# Patient Record
Sex: Female | Born: 1950 | Race: White | Hispanic: No | Smoking: Never smoker
Health system: Southern US, Community
[De-identification: ages and names within clinical notes are randomized; demographics above are authoritative.]

## PROBLEM LIST (undated history)

## (undated) DIAGNOSIS — K580 Irritable bowel syndrome with diarrhea: Secondary | ICD-10-CM

## (undated) DIAGNOSIS — E785 Hyperlipidemia, unspecified: Secondary | ICD-10-CM

## (undated) DIAGNOSIS — E039 Hypothyroidism, unspecified: Secondary | ICD-10-CM

## (undated) DIAGNOSIS — C439 Malignant melanoma of skin, unspecified: Secondary | ICD-10-CM

## (undated) DIAGNOSIS — K219 Gastro-esophageal reflux disease without esophagitis: Secondary | ICD-10-CM

## (undated) DIAGNOSIS — K5730 Diverticulosis of large intestine without perforation or abscess without bleeding: Secondary | ICD-10-CM

## (undated) DIAGNOSIS — K2270 Barrett's esophagus without dysplasia: Secondary | ICD-10-CM

## (undated) DIAGNOSIS — E079 Disorder of thyroid, unspecified: Secondary | ICD-10-CM

## (undated) DIAGNOSIS — K2990 Gastroduodenitis, unspecified, without bleeding: Secondary | ICD-10-CM

## (undated) DIAGNOSIS — M1990 Unspecified osteoarthritis, unspecified site: Secondary | ICD-10-CM

## (undated) DIAGNOSIS — K2970 Gastritis, unspecified, without bleeding: Secondary | ICD-10-CM

## (undated) DIAGNOSIS — Z9889 Other specified postprocedural states: Secondary | ICD-10-CM

## (undated) DIAGNOSIS — I10 Essential (primary) hypertension: Secondary | ICD-10-CM

## (undated) DIAGNOSIS — H269 Unspecified cataract: Secondary | ICD-10-CM

## (undated) DIAGNOSIS — C4490 Unspecified malignant neoplasm of skin, unspecified: Secondary | ICD-10-CM

## (undated) DIAGNOSIS — K589 Irritable bowel syndrome without diarrhea: Secondary | ICD-10-CM

## (undated) DIAGNOSIS — D649 Anemia, unspecified: Secondary | ICD-10-CM

## (undated) DIAGNOSIS — K449 Diaphragmatic hernia without obstruction or gangrene: Secondary | ICD-10-CM

## (undated) HISTORY — PX: APPENDECTOMY: SHX54

## (undated) HISTORY — DX: Hyperlipidemia, unspecified: E78.5

## (undated) HISTORY — DX: Anemia, unspecified: D64.9

## (undated) HISTORY — DX: Essential (primary) hypertension: I10

## (undated) HISTORY — DX: Unspecified osteoarthritis, unspecified site: M19.90

## (undated) HISTORY — PX: TRIGGER FINGER RELEASE: SHX641

## (undated) HISTORY — DX: Unspecified cataract: H26.9

## (undated) HISTORY — PX: TOTAL VAGINAL HYSTERECTOMY: SHX2548

## (undated) HISTORY — DX: Irritable bowel syndrome with diarrhea: K58.0

## (undated) HISTORY — DX: Unspecified malignant neoplasm of skin, unspecified: C44.90

## (undated) HISTORY — PX: EYE SURGERY: SHX253

## (undated) HISTORY — DX: Irritable bowel syndrome, unspecified: K58.9

## (undated) HISTORY — DX: Diverticulosis of large intestine without perforation or abscess without bleeding: K57.30

## (undated) HISTORY — PX: ABDOMINAL HYSTERECTOMY: SHX81

## (undated) HISTORY — DX: Barrett's esophagus without dysplasia: K22.70

## (undated) HISTORY — DX: Gastro-esophageal reflux disease without esophagitis: K21.9

## (undated) HISTORY — DX: Disorder of thyroid, unspecified: E07.9

## (undated) HISTORY — DX: Diaphragmatic hernia without obstruction or gangrene: K44.9

## (undated) HISTORY — PX: TMJ ARTHROPLASTY: SHX1066

## (undated) HISTORY — PX: OTHER SURGICAL HISTORY: SHX169

## (undated) HISTORY — DX: Malignant melanoma of skin, unspecified: C43.9

---

## 2000-06-10 DIAGNOSIS — D229 Melanocytic nevi, unspecified: Secondary | ICD-10-CM

## 2000-06-10 HISTORY — DX: Melanocytic nevi, unspecified: D22.9

## 2001-06-30 DIAGNOSIS — G2581 Restless legs syndrome: Secondary | ICD-10-CM | POA: Insufficient documentation

## 2004-01-09 DIAGNOSIS — K589 Irritable bowel syndrome without diarrhea: Secondary | ICD-10-CM | POA: Diagnosis present

## 2006-05-25 DIAGNOSIS — K7689 Other specified diseases of liver: Secondary | ICD-10-CM | POA: Insufficient documentation

## 2007-08-10 ENCOUNTER — Encounter: Admission: RE | Admit: 2007-08-10 | Discharge: 2007-08-10 | Payer: Self-pay | Admitting: Internal Medicine

## 2007-08-17 ENCOUNTER — Ambulatory Visit: Payer: Self-pay | Admitting: Internal Medicine

## 2007-10-22 HISTORY — PX: COLONOSCOPY: SHX174

## 2008-02-17 DIAGNOSIS — C4492 Squamous cell carcinoma of skin, unspecified: Secondary | ICD-10-CM

## 2008-02-17 HISTORY — DX: Squamous cell carcinoma of skin, unspecified: C44.92

## 2008-08-10 ENCOUNTER — Encounter: Admission: RE | Admit: 2008-08-10 | Discharge: 2008-08-10 | Payer: Self-pay | Admitting: Internal Medicine

## 2008-08-19 ENCOUNTER — Ambulatory Visit: Payer: Self-pay | Admitting: Gastroenterology

## 2008-09-05 ENCOUNTER — Ambulatory Visit: Payer: Self-pay | Admitting: Gastroenterology

## 2008-09-06 ENCOUNTER — Telehealth: Payer: Self-pay | Admitting: Gastroenterology

## 2008-10-03 DIAGNOSIS — K5730 Diverticulosis of large intestine without perforation or abscess without bleeding: Secondary | ICD-10-CM | POA: Insufficient documentation

## 2008-10-04 ENCOUNTER — Ambulatory Visit: Payer: Self-pay | Admitting: Gastroenterology

## 2008-10-04 DIAGNOSIS — E739 Lactose intolerance, unspecified: Secondary | ICD-10-CM | POA: Insufficient documentation

## 2008-10-04 DIAGNOSIS — R143 Flatulence: Secondary | ICD-10-CM

## 2008-10-04 DIAGNOSIS — K449 Diaphragmatic hernia without obstruction or gangrene: Secondary | ICD-10-CM | POA: Insufficient documentation

## 2008-10-04 DIAGNOSIS — I10 Essential (primary) hypertension: Secondary | ICD-10-CM | POA: Insufficient documentation

## 2008-10-04 DIAGNOSIS — M810 Age-related osteoporosis without current pathological fracture: Secondary | ICD-10-CM | POA: Insufficient documentation

## 2008-10-04 DIAGNOSIS — R142 Eructation: Secondary | ICD-10-CM

## 2008-10-04 DIAGNOSIS — R141 Gas pain: Secondary | ICD-10-CM | POA: Insufficient documentation

## 2008-10-04 DIAGNOSIS — K219 Gastro-esophageal reflux disease without esophagitis: Secondary | ICD-10-CM | POA: Insufficient documentation

## 2008-10-04 LAB — CONVERTED CEMR LAB
ALT: 28 units/L (ref 0–35)
AST: 29 units/L (ref 0–37)
Albumin: 4.3 g/dL (ref 3.5–5.2)
Alkaline Phosphatase: 71 units/L (ref 39–117)
BUN: 17 mg/dL (ref 6–23)
Basophils Absolute: 0 10*3/uL (ref 0.0–0.1)
Basophils Relative: 0.6 % (ref 0.0–3.0)
Bilirubin, Direct: 0.1 mg/dL (ref 0.0–0.3)
CO2: 29 meq/L (ref 19–32)
Calcium: 9.3 mg/dL (ref 8.4–10.5)
Chloride: 103 meq/L (ref 96–112)
Creatinine, Ser: 0.8 mg/dL (ref 0.4–1.2)
Eosinophils Absolute: 0.2 10*3/uL (ref 0.0–0.7)
Eosinophils Relative: 3.6 % (ref 0.0–5.0)
Ferritin: 79.5 ng/mL (ref 10.0–291.0)
Folate: >20 ng/mL
GFR calc Af Amer: 96 mL/min
GFR calc non Af Amer: 79 mL/min
Glucose, Bld: 98 mg/dL (ref 70–99)
HCT: 39.4 % (ref 36.0–46.0)
Hemoglobin: 13.9 g/dL (ref 12.0–15.0)
Iron: 79 ug/dL (ref 42–145)
Lymphocytes Relative: 37.7 % (ref 12.0–46.0)
MCHC: 35.2 g/dL (ref 30.0–36.0)
MCV: 94.4 fL (ref 78.0–100.0)
Monocytes Absolute: 0.6 10*3/uL (ref 0.1–1.0)
Monocytes Relative: 10.5 % (ref 3.0–12.0)
Neutro Abs: 2.5 10*3/uL (ref 1.4–7.7)
Neutrophils Relative %: 47.6 % (ref 43.0–77.0)
Platelets: 314 10*3/uL (ref 150–400)
Potassium: 3.3 meq/L — ABNORMAL LOW (ref 3.5–5.1)
RBC: 4.18 M/uL (ref 3.87–5.11)
RDW: 11.7 % (ref 11.5–14.6)
Saturation Ratios: 23.5 % (ref 20.0–50.0)
Sodium: 142 meq/L (ref 135–145)
TSH: 1.79 microintl units/mL (ref 0.35–5.50)
Tissue Transglutaminase Ab, IgA: 0.3 units (ref <7)
Total Bilirubin: 0.9 mg/dL (ref 0.3–1.2)
Total Protein: 7.3 g/dL (ref 6.0–8.3)
Transferrin: 240 mg/dL (ref >=212.0)
Vitamin B-12: 518 pg/mL (ref 211–911)
WBC: 5.3 10*3/uL (ref 4.5–10.5)

## 2008-10-05 ENCOUNTER — Ambulatory Visit: Payer: Self-pay | Admitting: Gastroenterology

## 2008-10-05 ENCOUNTER — Encounter: Payer: Self-pay | Admitting: Gastroenterology

## 2008-10-05 DIAGNOSIS — K2970 Gastritis, unspecified, without bleeding: Secondary | ICD-10-CM | POA: Insufficient documentation

## 2008-10-05 DIAGNOSIS — K2990 Gastroduodenitis, unspecified, without bleeding: Secondary | ICD-10-CM

## 2008-10-05 HISTORY — DX: Gastritis, unspecified, without bleeding: K29.70

## 2008-10-05 HISTORY — DX: Gastroduodenitis, unspecified, without bleeding: K29.90

## 2008-10-05 LAB — CONVERTED CEMR LAB: UREASE: NEGATIVE

## 2008-10-10 ENCOUNTER — Encounter: Payer: Self-pay | Admitting: Gastroenterology

## 2008-10-11 ENCOUNTER — Telehealth: Payer: Self-pay | Admitting: Gastroenterology

## 2008-10-21 HISTORY — PX: NISSEN FUNDOPLICATION: SHX2091

## 2008-10-24 DIAGNOSIS — Z8719 Personal history of other diseases of the digestive system: Secondary | ICD-10-CM | POA: Insufficient documentation

## 2008-11-10 ENCOUNTER — Ambulatory Visit: Payer: Self-pay | Admitting: Gastroenterology

## 2008-11-10 DIAGNOSIS — K2270 Barrett's esophagus without dysplasia: Secondary | ICD-10-CM | POA: Insufficient documentation

## 2008-11-16 ENCOUNTER — Telehealth: Payer: Self-pay | Admitting: Gastroenterology

## 2008-12-01 ENCOUNTER — Encounter: Payer: Self-pay | Admitting: Gastroenterology

## 2008-12-05 ENCOUNTER — Encounter: Admission: RE | Admit: 2008-12-05 | Discharge: 2008-12-05 | Payer: Self-pay | Admitting: Surgery

## 2008-12-12 ENCOUNTER — Ambulatory Visit: Payer: Self-pay | Admitting: Gastroenterology

## 2008-12-12 ENCOUNTER — Ambulatory Visit (HOSPITAL_COMMUNITY): Admission: RE | Admit: 2008-12-12 | Discharge: 2008-12-12 | Payer: Self-pay | Admitting: Gastroenterology

## 2009-01-18 ENCOUNTER — Ambulatory Visit (HOSPITAL_COMMUNITY): Admission: RE | Admit: 2009-01-18 | Discharge: 2009-01-20 | Payer: Self-pay | Admitting: Surgery

## 2009-08-11 ENCOUNTER — Encounter: Admission: RE | Admit: 2009-08-11 | Discharge: 2009-08-11 | Payer: Self-pay | Admitting: Internal Medicine

## 2010-06-03 DIAGNOSIS — Z9079 Acquired absence of other genital organ(s): Secondary | ICD-10-CM | POA: Insufficient documentation

## 2010-06-03 DIAGNOSIS — Z90710 Acquired absence of both cervix and uterus: Secondary | ICD-10-CM | POA: Insufficient documentation

## 2010-06-05 ENCOUNTER — Encounter: Payer: Self-pay | Admitting: Endocrinology

## 2010-06-05 LAB — CONVERTED CEMR LAB
ALT: 30 units/L
AST: 30 units/L
Albumin: 4.1 g/dL
Alkaline Phosphatase: 56 units/L
BUN: 16 mg/dL
CO2: 31 meq/L
Calcium: 9.6 mg/dL
Chloride: 104 meq/L
Cholesterol: 215 mg/dL
Creatinine, Ser: 0.66 mg/dL
Free T4: 0.94 ng/dL
Glucose, Bld: 89 mg/dL
HDL: 58.3 mg/dL
LDL Cholesterol: 137.7 mg/dL
Potassium: 4.5 meq/L
Sodium: 143 meq/L
TSH: 0.09 microintl units/mL
Total Bilirubin: 0.9 mg/dL
Total Protein: 7.3 g/dL
Triglyceride fasting, serum: 95 mg/dL

## 2010-06-13 ENCOUNTER — Encounter: Payer: Self-pay | Admitting: Endocrinology

## 2010-06-14 ENCOUNTER — Encounter: Payer: Self-pay | Admitting: Endocrinology

## 2010-06-14 ENCOUNTER — Ambulatory Visit: Payer: Self-pay | Admitting: Endocrinology

## 2010-06-18 ENCOUNTER — Encounter: Admission: RE | Admit: 2010-06-18 | Discharge: 2010-06-18 | Payer: Self-pay | Admitting: Endocrinology

## 2010-07-17 ENCOUNTER — Ambulatory Visit: Payer: Self-pay | Admitting: Endocrinology

## 2010-07-17 DIAGNOSIS — E039 Hypothyroidism, unspecified: Secondary | ICD-10-CM | POA: Insufficient documentation

## 2010-07-17 LAB — CONVERTED CEMR LAB: TSH: 0.47 microintl units/mL (ref 0.35–5.50)

## 2010-08-13 ENCOUNTER — Encounter: Admission: RE | Admit: 2010-08-13 | Discharge: 2010-08-13 | Payer: Self-pay | Admitting: Internal Medicine

## 2010-11-06 ENCOUNTER — Encounter: Payer: Self-pay | Admitting: Gastroenterology

## 2010-11-20 NOTE — Progress Notes (Signed)
Summary: Protonix increased to BID  Phone Note Call from Patient Call back at [phone]   Call For: Dr Jarold Motto Summary of Call: Protonix generic 40mg -was doubled at her last visit and ran out of her current supply. Would like a refill called in to [facility] on [address] with the new dose of twice a day please. Initial call taken by: Leanor [facility],  October 11, 2008 9:29 AM  Follow-up for Phone Call        pt aware... rx sent  Follow-up by: Harlow Mares CMA,  October 11, 2008 9:40 AM      Prescriptions: PANTOPRAZOLE SODIUM 40 MG TBEC (PANTOPRAZOLE SODIUM) take one tablet by mouth two times a day  #60 x 11   Entered by:   Harlow Mares CMA   Authorized by:   Mardella Layman MD FACG,FAGA   Signed by:   Harlow Mares CMA on 10/11/2008   Method used:   Electronically to        [facility]. [phone]* (retail)       [address]       [place], [place]  [address]       Ph: [phone]       Fax: [phone]   RxID:   [id number]

## 2010-11-20 NOTE — Progress Notes (Signed)
Summary: ? report  Phone Note Call from Patient Call back at Work Phone [phone]   Caller: Patient Call For: PATTERSON  Reason for Call: Talk to Nurse Details for Reason: report Summary of Call: had Colon yesterday and has ? re: report given Initial call taken by: Guadlupe Spanish [facility],  September 06, 2008 10:11 AM  Follow-up for Phone Call        return phone call concerning procedure yesterday. answered question. question about appointment next month. Follow-up by: Darlyn Read RN,  September 06, 2008 10:27 AM

## 2010-11-20 NOTE — Progress Notes (Signed)
Summary: ? meds  Phone Note Call from Patient Call back at Home Phone [phone]   Caller: Patient Call For: PATTERSON  Reason for Call: Talk to Nurse Details for Reason: ? meds  Summary of Call: does pt need to continue Boniva ? Initial call taken by: Guadlupe Spanish [facility],  November 16, 2008 3:31 PM  Follow-up for Phone Call        yes patient can continue Boniva. patients number not avb. Follow-up by: Harlow Mares CMA,  November 16, 2008 4:49 PM  Additional Follow-up for Phone Call Additional follow up Details #1::        pt aware Additional Follow-up by: Harlow Mares CMA,  November 17, 2008 10:33 AM

## 2010-11-20 NOTE — Procedures (Signed)
Summary: Colonoscopy   Colonoscopy  Procedure date:  09/05/2008  Findings:      Location:  [facility].    Patient Name: [name], [name] MRN:  Procedure Procedures: Colonoscopy CPT: [phone].  Personnel: Endoscopist: Vania Rea. Jarold Motto, MD.  Referred By: Kathlee Nations, MD.  Exam Location: Exam performed in Outpatient Clinic. Outpatient  Patient Consent: Procedure, Alternatives, Risks and Benefits discussed, consent obtained, from patient. Consent was obtained by the RN.  Indications  Average Risk Screening Routine.  History  Current Medications: Patient is taking an non-steroidal medication. Patient is not currently taking Coumadin.  Medical/ Surgical History: Reflux Disease, Irritable Bowel Syndrome, Osteoporesis,  Pre-Exam Physical: Performed Sep 05, 2008. Cardio-pulmonary exam, Abdominal exam, Extremity exam, Mental status exam WNL.  Comments: Pt. history reviewed/updated, physical exam performed prior to initiation of sedation? yes Exam Exam: Extent of exam reached: Cecum, extent intended: Cecum.  The cecum was identified by appendiceal orifice and IC valve. Patient position: on left side. Time to Cecum: 00:08:03. Time for Withdrawl: 00:04. Colon retroflexion performed. Images taken. ASA Classification: II. Tolerance: excellent.  Monitoring: Pulse and BP monitoring, Oximetry used. Supplemental O2 given. at 2 Liters.  Colon Prep Used Golytely for colon prep. Prep results: excellent.  Sedation Meds: Patient assessed and found to be appropriate for moderate (conscious) sedation. Sedation was managed by the Endoscopist. Fentanyl 100 mcg. given IV. Versed 10 mg. given IV.  Instrument(s): CF 140L. Serial [id number].  Findings - NORMAL EXAM: Cecum to Splenic Flexure. Not Seen: Polyps. AVM's. Colitis. Tumors. Crohn's. Diverticulosis.  - DIVERTICULOSIS: Descending Colon to Sigmoid Colon. Not bleeding. ICD9: Diverticulosis, Colon: 562.10. Comments:  Redundant and tortuous colon noted,,,,.  - NORMAL EXAM: Sigmoid Colon to Rectum. Tumors. Crohn's. Hemorrhoids.   Assessment  Diagnoses: 562.10: Diverticulosis, Colon. IBS.   Events  Unplanned Interventions: No intervention was required.  Plans Medication Plan: Continue current medications. Fiber supplements: Methylcellulose 1 Tbsp QAM, starting Sep 05, 2008 for indefinitely.   Patient Education: Patient given standard instructions for: Diverticulosis. high fiber diet.  Disposition: After procedure patient sent to recovery. After recovery patient sent home.  Scheduling/Referral: Follow-Up prn.    cc: Kathlee Nations, MD  This report was created from the original endoscopy report, which was reviewed and signed by the above listed endoscopist.

## 2010-11-20 NOTE — Assessment & Plan Note (Signed)
Summary: NEW ENDO ABNORM THY LEVEL [employer] PT--#-PKG/OFF--STC   Vital Signs:  Patient profile:   60 year old female Menstrual status:  hysterectomy Height:      64 inches (162.56 cm) Weight:      153.50 pounds (69.77 kg) BMI:     26.44 O2 Sat:      99 % on Room air Temp:     98.0 degrees F (36.67 degrees C) oral Pulse rate:   68 / minute BP sitting:   130 / 82  (left arm) Cuff size:   regular  Vitals Entered By: Brenton Grills MA (June 14, 2010 9:26 AM)  O2 Flow:  Room air CC: New Endo/Abnormal Thyroid/aj     Menstrual Status hysterectomy   Primary Provider:  Kathlee Nations, MD  CC:  New Endo/Abnormal Thyroid/aj.  History of Present Illness: pt was dx'ed with hypothyroidism in 1993.  she has been on thyroid hormone ever since then.  she initially took ony synthroid, until 2004, when she was found to have a low t3 level.  then, cytomel was added.   symptomatically, she says her fatigue was initially was improved on the cytomel.  however, she now reports 6 mos of moderate tremor of the hands, and associated fatigue.  Current Medications (verified): 1)  Boniva 150 Mg Tabs (Ibandronate Sodium) .... Take  Tablet By Mouth Once Per Month. 2)  Pantoprazole Sodium 40 Mg Tbec (Pantoprazole Sodium) .... Take One Tablet By Mouth Once Daily 3)  Hydrochlorothiazide 25 Mg Tabs (Hydrochlorothiazide) .... Take One By Mouth Once Daily 4)  Levothroid 88 Mcg Tabs (Levothyroxine Sodium) .... Take One By Mouth Once Daily 5)  Cytomel 5 Mcg Tabs (Liothyronine Sodium) .... Take One By Mouth Once Daily 6)  Valtrex 1 Gm Tabs (Valacyclovir Hcl) .... Take One - Two By Mouth Once Daily 7)  Calcium 600/vitamin D 600-400 Mg-Unit Tabs (Calcium Carbonate-Vitamin D) .... Take One By Mouth Once Daily 8)  One-A-Day Extras Antioxidant  Caps (Multiple Vitamins-Minerals) .... Take One By Mouth Once Daily 9)  Aspirin 81 Mg Tbec (Aspirin) .... Take 1 Tablet By Mouth Once A Day 10)  Famotidine 20 Mg Tabs (Famotidine)  .... Take 1 Tab By Mouth At Bedtime 11)  Evista 60 Mg Tabs (Raloxifene Hcl) .Marland Kitchen.. 1 Tablet Once Daily 12)  Tramadol Hcl 50 Mg Tabs (Tramadol Hcl) .Marland Kitchen.. 1-2 Tablets Every 6 Hours As Needed For Pain  Allergies: 1)  ! Fosamax (Alendronate Sodium) 2)  Codeine 3)  Tetracycline 4)  Cipro  Past History:  Past Medical History: Last updated: 10/04/2008 Current Problems:  HYPERTENSION (ICD-401.9) OSTEOPOROSIS (ICD-733.00) LACTOSE INTOLERANCE (ICD-271.3) FLATULENCE ERUCTATION AND GAS PAIN (ICD-787.3) GERD (ICD-530.81) HIATAL HERNIA (ICD-553.3) DIVERTICULOSIS, COLON (ICD-562.10)  Family History: Reviewed history from 10/04/2008 and no changes required. No FH of Colon Cancer: Family History of Esophageal Cancer:Fatherthis actually was neck and throat cancer. Family History of Diabetes: Mother Family History of Heart Disease: Mother, Father sister had partial thyroidectomy for nodules--benign  Social History: Reviewed history from 10/04/2008 and no changes required. Alcohol Use - yes-rare Patient has never smoked.  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         she also reports flushing, and excessive diaphoresis in the am.  she attributes hoarseness to rhinorrhea.  she attributes diarrhea to chronic  gi probs, and easy bruising. denies double vision, palpitations, sob, polyuria, myalgias, numbness, anxiety, and menopaisal sxs.   Physical Exam  General:  normal appearance.   Head:  head: no deformity eyes: no periorbital swelling, no proptosis external nose and ears are normal mouth: no lesion seen Neck:  small goiter with irregular surface--? bilat small nodules Lungs:  Clear to auscultation bilaterally. Normal respiratory effort.  Heart:  Regular rate and rhythm without murmurs or gallops noted.  Normal S1,S2.   Msk:  muscle bulk and strength are grossly normal.  no obvious joint swelling.  gait is normal and steady  Extremities:  no edema no deformity Neurologic:  cn 2-12 grossly intact.   readily moves all 4's.   sensation is intact to touch on the feet there is a slight fine tremor  Skin:  normal texture and temp.  no rash.  not diaphoretic  Cervical Nodes:  No significant adenopathy.  Psych:  Alert and cooperative; normal mood and affect; normal attention span and concentration.   Additional Exam:  outside test results are reviewed:  tsh=0.09   Impression & Recommendations:  Problem # 1:  GOITER, MULTINODULAR (ICD-241.1) she has probably developed hyperfunction in these nodules  Problem # 2:  hypothyroidism overreplacement now is prob due to #1  Problem # 3:  OSTEOPOROSIS (ICD-733.00) prob not thyroid-related   Problem # 4:  fatigue not thyroid-related  Medications Added to Medication List This Visit: 1)  Evista 60 Mg Tabs (Raloxifene hcl) .Marland Kitchen.. 1 tablet once daily 2)  Tramadol Hcl 50 Mg Tabs (Tramadol hcl) .Marland Kitchen.. 1-2 tablets every 6 hours as needed for pain  Other Orders: Radiology Referral (Radiology) Consultation Level IV (16109)  Patient Instructions: 1)  check thyroid ultrasound.  you will be called with a day and time for an appointment.   2)  we will need to take this situation in stages. 3)  stop cytomel. 4)  Please schedule a follow-up appointment in 1 month.   Preventive Care Screening  Mammogram:    Date:  10/21/2008    Results:  normal   Last Tetanus Booster:    Date:  07/06/2003    Results:  Historical

## 2010-11-20 NOTE — Assessment & Plan Note (Signed)
Summary: post egd of 10/05/08/scm   History of Present Illness Visit Type: follow up Primary GI MD: Sheryn Bison MD FACP FAGA Primary Provider: Kathlee Nations, MD Requesting Provider: n/a Chief Complaint: Follow-up visit from having EGD on 10/05/08. No GI complaints History of Present Illness:   [name] continues with acid reflux symptoms that are rather typical in nature despite taking Protonix 40 mg before supper and ranitidine before bed time. Her endoscopy showed a large hiatal hernia and she has biopsy confirmed Barrett's mucosa. Endoscopy did show some gastritis but H. pylori exam is negative. Her previous colonoscopy was negative. She does not have Raynaud's phenomenon or collagen vascular disease. She otherwise is healthy has no serious chronic medical problems and watches her diet closely.   GI Review of Systems    Reports acid reflux, belching, and  heartburn.      Denies abdominal pain, bloating, chest pain, dysphagia with liquids, dysphagia with solids, loss of appetite, nausea, vomiting, vomiting blood, weight loss, and  weight gain.        Denies anal fissure, black tarry stools, change in bowel habit, constipation, diarrhea, diverticulosis, fecal incontinence, heme positive stool, hemorrhoids, irritable bowel syndrome, jaundice, light color stool, liver problems, rectal bleeding, and  rectal pain.     Prior Medications Reviewed Using: Patient Recall  Prior Medication List:  BONIVA 150 MG TABS (IBANDRONATE SODIUM) Take  tablet by mouth once per month. PANTOPRAZOLE SODIUM 40 MG TBEC (PANTOPRAZOLE SODIUM) take one tablet by mouth two times a day HYDROCHLOROTHIAZIDE 25 MG TABS (HYDROCHLOROTHIAZIDE) take one by mouth once daily LEVOTHROID 88 MCG TABS (LEVOTHYROXINE SODIUM) take one by mouth once daily CYTOMEL 5 MCG TABS (LIOTHYRONINE SODIUM) take one by mouth once daily VALTREX 1 GM TABS (VALACYCLOVIR HCL) take one - two by mouth once daily CALCIUM 600/VITAMIN D 600-400 MG-UNIT  TABS (CALCIUM CARBONATE-VITAMIN D) take one by mouth once daily ONE-A-DAY EXTRAS ANTIOXIDANT  CAPS (MULTIPLE VITAMINS-MINERALS) take one by mouth once daily ASPIRIN 81 MG TBEC (ASPIRIN) Take 1 tablet by mouth once a day FAMOTIDINE 20 MG TABS (FAMOTIDINE) Take 1 tab by mouth at bedtime   Current Allergies (reviewed today): CODEINE TETRACYCLINE CIPRO  Past Medical History:    Reviewed history from 10/04/2008 and no changes required:       Current Problems:        HYPERTENSION (ICD-401.9)       OSTEOPOROSIS (ICD-733.00)       LACTOSE INTOLERANCE (ICD-271.3)       FLATULENCE ERUCTATION AND GAS PAIN (ICD-787.3)       GERD (ICD-530.81)       HIATAL HERNIA (ICD-553.3)       DIVERTICULOSIS, COLON (ICD-562.10)                Past Surgical History:    Reviewed history from 10/04/2008 and no changes required:       Hysterectomy       Tubal Ligation       triger finger surgery       Appendectomy   Family History:    Reviewed history from 10/04/2008 and no changes required:       No FH of Colon Cancer:       Family History of Esophageal Cancer:Fatherthis actually was neck and throat cancer.       Family History of Diabetes: Mother       Family History of Heart Disease: Mother, Father  Social History:    Reviewed history from 10/04/2008 and no changes required:  Alcohol Use - yes-rare       Patient has never smoked.        Daily Caffeine Use       Illicit Drug Use - no       Patient does not get regular exercise.     Review of Systems  The patient denies allergy/sinus, anemia, anxiety-new, arthritis/joint pain, back pain, blood in urine, breast changes/lumps, change in vision, confusion, cough, coughing up blood, depression-new, fainting, fatigue, fever, headaches-new, hearing problems, heart murmur, heart rhythm changes, itching, menstrual pain, muscle pains/cramps, night sweats, nosebleeds, pregnancy symptoms, shortness of breath, skin rash, sleeping problems, sore throat,  swelling of feet/legs, swollen lymph glands, thirst - excessive , urination - excessive , urination changes/pain, urine leakage, vision changes, and voice change.     Vital Signs:  Patient Profile:   60 Years Old Female Height:     64 inches Weight:      158.50 pounds BMI:     27.30 Pulse rate:   74 / minute Pulse rhythm:   regular BP sitting:   100 / 70  (left arm)  Vitals Entered By: [name] CMA (November 10, 2008 8:30 AM)                  Physical Exam  General:     Well developed, well nourished, no acute distress.healthy appearing.   Head:     Normocephalic and atraumatic. Eyes:     PERRLA, no icterus.exam deferred to patient's ophthalmologist.   Psych:     Alert and cooperative. Normal mood and affect.    Impression & Recommendations:  Problem # 1:  GERD (ICD-530.81) Assessment: Improved She has had years of refractory GERD and has Barrett's mucosa partially responsive to aggressive acid suppression. I think she is a good candidate for fundoplication surgery and have referred her to Dr. Luretha Murphy for consultation. I also scheduled esophageal manometry at her convenience. We will increase her Protonix to 40 mg 30 minutes before breakfast and supper along with standard antireflux maneuvers.  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  Problem # 3:  LACTOSE INTOLERANCE (ICD-271.3) Assessment: Improved   Patient Instructions: 1)  Copy Sent To:Dr. Luretha Murphy and Dr. Kathlee Nations 2)  Avoid foods high in acid content (tomatoes, citrus juices, spicy foods). Avoid eating within 3 to 4 hours of lying down or before exercising. Do not over eat; try smaller more frequent meals. Elevate head of bed four inches when sleeping. 3)  Esophageal manometry brochure given. 4)  increase Protonix to 40 mg twice a day 5)  Please Continue current medications. 6)  Barrett's  Esophagus brochure given.     Appended Document: post egd of 10/05/08/scm    Clinical Lists Changes  Orders: Added new Test order of Manometry (Manometry) - Signed Added new Test order of [facility] ([facility]) - Signed

## 2010-11-20 NOTE — Procedures (Signed)
Summary: manometry   Esophageal Manometry  Procedure date:  12/12/2008  Findings:      normal:    This procedure was completed on February 22 of 2010. It was a difficult exam because of a large hiatal hernia. Lower esophageal sphincter pressure appears to be normal at 19 mm of mercury with normal relaxation of swallowing. Esophageal peristalsis is normal with mean amplitude of contractions at 77 mmHg and 100% peristaltic waves noted. The upper esophageal sphincter pressure could not be recorded because of technical difficulties.

## 2010-11-20 NOTE — Assessment & Plan Note (Signed)
Summary: 1 MTH FU  STC   Vital Signs:  Patient profile:   60 year old female Menstrual status:  hysterectomy Height:      64 inches (162.56 cm) Weight:      155.25 pounds (70.57 kg) BMI:     26.74 O2 Sat:      97 % on Room air Temp:     98.7 degrees F (37.06 degrees C) oral Pulse rate:   66 / minute BP sitting:   108 / 68  (left arm) Cuff size:   regular  Vitals Entered By: Brenton Grills MA (July 17, 2010 7:59 AM)  O2 Flow:  Room air CC: 1 month F/U/aj   Primary Provider:  Kathlee Nations, MD  CC:  1 month F/U/aj.  History of Present Illness: pt has h/o chronic thyroiditis, with resultant hypothyroidism.  off the cytomel, she feels more energetic.    Current Medications (verified): 1)  Pantoprazole Sodium 40 Mg Tbec (Pantoprazole Sodium) .... Take One Tablet By Mouth Once Daily 2)  Hydrochlorothiazide 25 Mg Tabs (Hydrochlorothiazide) .... Take One By Mouth Once Daily 3)  Levothroid 88 Mcg Tabs (Levothyroxine Sodium) .... Take One By Mouth Once Daily 4)  Valtrex 1 Gm Tabs (Valacyclovir Hcl) .... Take One - Two By Mouth Once Daily 5)  Calcium 600/vitamin D 600-400 Mg-Unit Tabs (Calcium Carbonate-Vitamin D) .... Take One By Mouth Once Daily 6)  One-A-Day Extras Antioxidant  Caps (Multiple Vitamins-Minerals) .... Take One By Mouth Once Daily 7)  Aspirin 81 Mg Tbec (Aspirin) .... Take 1 Tablet By Mouth Once A Day 8)  Famotidine 20 Mg Tabs (Famotidine) .... Take 1 Tab By Mouth At Bedtime 9)  Evista 60 Mg Tabs (Raloxifene Hcl) .Marland Kitchen.. 1 Tablet Once Daily 10)  Tramadol Hcl 50 Mg Tabs (Tramadol Hcl) .Marland Kitchen.. 1-2 Tablets Every 6 Hours As Needed For Pain  Allergies (verified): 1)  ! Fosamax (Alendronate Sodium) 2)  Codeine 3)  Tetracycline 4)  Cipro  Past History:  Past Medical History: Last updated: 10/04/2008 Current Problems:  HYPERTENSION (ICD-401.9) OSTEOPOROSIS (ICD-733.00) LACTOSE INTOLERANCE (ICD-271.3) FLATULENCE ERUCTATION AND GAS PAIN (ICD-787.3) GERD  (ICD-530.81) HIATAL HERNIA (ICD-553.3) DIVERTICULOSIS, COLON (ICD-562.10)  Review of Systems  The patient denies weight loss and weight gain.    Physical Exam  General:  normal appearance.   Neck:  small goiter with irregular surface Neurologic:  no tremor Skin:  not diaphoretic Additional Exam:  FastTSH                   0.47 uIU/mL           Impression & Recommendations:  Problem # 1:  HYPOTHYROIDISM (ICD-244.9) well-replaced  Other Orders: TLB-TSH (Thyroid Stimulating Hormone) (84443-TSH) Est. Patient Level III (57846)  Patient Instructions: 1)  blood tests are being ordered for you today.  please call [phone] to hear your test results. 2)  i left message on phone tree 3)  (update: i left message on phone-tree:  same synthroid.  ret 6 mos.

## 2010-11-20 NOTE — Assessment & Plan Note (Signed)
Summary: F/U FROM COLON 09/05/08   History of Present Illness Visit Type: follow up Primary GI MD: Sheryn Bison MD FACP FAGA Primary Daviyon Widmayer: Kathlee Nations, MD Chief Complaint: Patient here for f/u after colonoscopy.  Patient states that as far as her lower GI tract, things are going much better and she has no complaints.  Patient does however complain of severe chest pressure which she relates to a hiatal hernia.  She denies any nausea, vomiting, reflux symptoms or dysphagia.  Patient does c/o some excessive belching at times. History of Present Illness:   This patient is a 60 year old white female recently did screening colonoscopy on which was unremarkable except for diverticulosis. She actually  come back to the office because of complaints of rather severe gas, bloating, and mid abdominal pain which has been present since childhood.  The patient said abdominal distention and discomfort since age 60 has had some mild constipation but no rectal bleeding chronic anemia. She's had rather severe acid reflux for many years and takes Protonix after breakfast and Pepcid at bedtime. She continues with rather classical regurgitation burning sternal chest pain, throat pressure, and choking at night. She had an upper GI series allegedly 15 years ago which showed a" hiatal hernia". She has not had previous endoscopy.  She has known lactose intolerance and also intolerance to many foods including garlic-based products. She been using yogurt recently with some improvement in her mild constipation. Her abdominal pain seemed to be associated with bloating is in the mid abdomen and associated with belching and flatus. She relates she's had numerous ultrasound exams over the last 10 years which have been negative. She apparently previously had some weakly positive celiac antibodies although this is unclear. She has had no previous abdominal surgery save appendectomy. Her father died of what sounds like pharyngeal  carcinoma. Her family is of [place] descent but there is no known celiac disease. Patient does suffer from osteoporosis. She also has hypertension is on antihypertensive medications and aspirin therapy.   GI Review of Systems    Reports acid reflux, belching, bloating, chest pain, and  heartburn.      Denies nausea, vomiting, vomiting blood, and  weight loss.        Denies anal fissure, black tarry stools, change in bowel habit, constipation, diarrhea, diverticulosis, fecal incontinence, heme positive stool, hemorrhoids, irritable bowel syndrome, jaundice, light color stool, liver problems, rectal bleeding, and  rectal pain.     Prior Medications Reviewed Using: Patient Recall  Updated Prior Medication List: BONIVA 150 MG TABS (IBANDRONATE SODIUM) Take  tablet by mouth once per month. PANTOPRAZOLE SODIUM 40 MG TBEC (PANTOPRAZOLE SODIUM) take one by mouth once daily HYDROCHLOROTHIAZIDE 25 MG TABS (HYDROCHLOROTHIAZIDE) take one by mouth once daily LEVOTHROID 88 MCG TABS (LEVOTHYROXINE SODIUM) take one by mouth once daily CYTOMEL 5 MCG TABS (LIOTHYRONINE SODIUM) take one by mouth once daily VALTREX 1 GM TABS (VALACYCLOVIR HCL) take one - two by mouth once daily CALCIUM 600/VITAMIN D 600-400 MG-UNIT TABS (CALCIUM CARBONATE-VITAMIN D) take one by mouth once daily ONE-A-DAY EXTRAS ANTIOXIDANT  CAPS (MULTIPLE VITAMINS-MINERALS) take one by mouth once daily ASPIRIN 81 MG TBEC (ASPIRIN) Take 1 tablet by mouth once a day FAMOTIDINE 20 MG TABS (FAMOTIDINE) Take 1 tab by mouth at bedtime  Current Allergies (reviewed today): CODEINE TETRACYCLINE CIPRO  Past Medical History:    Reviewed history from 10/03/2008 and no changes required:       Current Problems:        HYPERTENSION (  ICD-401.9)       OSTEOPOROSIS (ICD-733.00)       LACTOSE INTOLERANCE (ICD-271.3)       FLATULENCE ERUCTATION AND GAS PAIN (ICD-787.3)       GERD (ICD-530.81)       HIATAL HERNIA (ICD-553.3)       DIVERTICULOSIS,  COLON (ICD-562.10)                Past Surgical History:    Reviewed history from 10/03/2008 and no changes required:       Hysterectomy       Tubal Ligation       triger finger surgery       Appendectomy    Family History:    Reviewed history and no changes required:       No FH of Colon Cancer:       Family History of Esophageal Cancer:Fatherthis actually was neck and throat cancer.       Family History of Diabetes: Mother       Family History of Heart Disease: Mother, Father  Social History:    Reviewed history from 10/03/2008 and no changes required:       Alcohol Use - yes-rare       Patient has never smoked.        Daily Caffeine Use       Illicit Drug Use - no       Patient does not get regular exercise.    Risk Factors:  Tobacco use:  never Drug use:  no Caffeine use:  3 drinks per day    Type:  wine    Comments:  rare Exercise:  no   Review of Systems  The patient denies allergy/sinus, anemia, anxiety-new, arthritis/joint pain, back pain, blood in urine, breast changes/lumps, change in vision, confusion, cough, coughing up blood, depression-new, fainting, fatigue, fever, headaches-new, hearing problems, heart murmur, heart rhythm changes, itching, menstrual pain, muscle pains/cramps, night sweats, nosebleeds, pregnancy symptoms, shortness of breath, skin rash, sleeping problems, sore throat, swelling of feet/legs, swollen lymph glands, thirst - excessive , urination - excessive , urination changes/pain, urine leakage, vision changes, and voice change.     Vital Signs:  Patient Profile:   60 Years Old Female Height:     64 inches Weight:      158.50 pounds BMI:     27.30 BSA:     1.77 Pulse rate:   76 / minute Pulse rhythm:   regular BP sitting:   116 / 76  (right arm)  Vitals Entered By: Hortense Ramal CMA (October 04, 2008 8:47 AM)                  Physical Exam  General:     Well developed, well nourished, no acute distress.healthy  appearing.   Head:     Normocephalic and atraumatic. Eyes:     PERRLA, no icterus.exam deferred to patient's ophthalmologist.   Neck:     Supple; no masses or thyromegaly. Lungs:     Clear throughout to auscultation. Heart:     Regular rate and rhythm; no murmurs, rubs,  or bruits. Abdomen:     Soft, nontender and nondistended. No masses, hepatosplenomegaly or hernias noted. Normal bowel sounds.I cannot appreciate any significant distention, organomegaly masses or tenderness today. Bowel sounds are normal. Extremities:     No clubbing, cyanosis, edema or deformities noted. Neurologic:     Alert and  oriented x4;  grossly normal neurologically. Psych:  Alert and cooperative. Normal mood and affect.    Impression & Recommendations:  Problem # 1:  GERD (ICD-530.81) Assessment: Deteriorated I have reviewed reflux usually with this patient and she saw her patient education moving on acid reflux and its management. I've instructed her to take her proton 30 minutes Before breakfast and supper. She had been taking it incorrectly after meals. Endoscopy is scheduled her convenience and will obtain small bowel biopsy to exclude celiac disease. I did order screening laboratory parameters today.  Orders: T-Sprue Panel (Celiac Disease Gustavus Bryant) [id number]) EGD (EGD) TLB-CBC Platelet - w/Differential (85025-CBCD) TLB-BMP (Basic Metabolic Panel-BMET) (80048-METABOL) TLB-Hepatic/Liver Function Pnl (80076-HEPATIC) TLB-TSH (Thyroid Stimulating Hormone) (84443-TSH) TLB-B12, Serum-Total ONLY (83151-V61) TLB-Ferritin (82728-FER) TLB-Folic Acid (Folate) (82746-FOL) TLB-IBC Pnl (Iron/FE;Transferrin) (83550-IBC)   Problem # 2:  FLATULENCE ERUCTATION AND GAS PAIN (ICD-787.3) Assessment: Deteriorated considerations here would be malabsorption of non-digested carbohydrates with associated lactose intolerance. Also possible chronic bacterial overgrowth syndrome with a background of IBS. As per  above, celiac disease also needs to be excluded. I have given her a" low gas diet" until workup can be completed. Orders: EGD (EGD)    Patient Instructions: 1)  Copy Sent To:Dr. Kathlee Nations. 2)  Conscious Sedation brochure given. 3)  Upper Endoscopy brochure given. 4)  increase protonic to 40 mg twice a day 30 minutes before meals. 5)  Avoid foods high in acid content (tomatoes, citrus juices, spicy foods). Avoid eating within 3 to 4 hours of lying down or before exercising. Do not over eat; try smaller more frequent meals. Elevate head of bed four inches when sleeping. 6)  Excessive Gas Diet handout given. 7)  [facility] Patient Information Guide given to patient. 8)  Willey Blade advised to increase Protonix to two times a day dosing but she will need new rx to increase if two times a day dosing is needed after the procedure. 9)  Patient watched the Reflux movie in the office today.    ]

## 2010-11-20 NOTE — Miscellaneous (Signed)
Summary: GI PV  Clinical Lists Changes  Medications: Added new medication of MOVIPREP 100 GM  SOLR (PEG-KCL-NACL-NASULF-NA ASC-C) As per prep instructions. - Signed Rx of MOVIPREP 100 GM  SOLR (PEG-KCL-NACL-NASULF-NA ASC-C) As per prep instructions.;  #1 x 0;  Signed;  Entered by: Barton Fanny RN;  Authorized by: Mardella Layman MD [facility];  Method used: Electronically to [facility]. [phone]*, [address] [place], [place], [place]  [address], Ph: [phone], Fax: [phone] Allergies: Added new allergy or adverse reaction of CODEINE Added new allergy or adverse reaction of TETRACYCLINE Added new allergy or adverse reaction of CIPRO Observations: Added new observation of ALLERGY REV: Done (08/19/2008 15:41) Added new observation of NKA: F (08/19/2008 15:41)    Prescriptions: MOVIPREP 100 GM  SOLR (PEG-KCL-NACL-NASULF-NA ASC-C) As per prep instructions.  #1 x 0   Entered by:   Barton Fanny RN   Authorized by:   Mardella Layman MD [facility]   Signed by:   Barton Fanny RN on 08/19/2008   Method used:   Electronically to        [facility] [phone]* (retail)       [address]       [place], [place]  [address]       Ph: [phone]       Fax: [phone]   RxID:   [id number]

## 2010-11-20 NOTE — Miscellaneous (Signed)
Summary: clotest/scm  Clinical Lists Changes  Problems: Added new problem of GASTRITIS (ICD-535.50) Orders: Added new Test order of TLB-H Pylori Screen Gastric Biopsy (83013-CLOTEST) - Signed

## 2010-11-20 NOTE — Letter (Signed)
Summary: Patient Notice-Barrett's [facility]  [address] [place], [place] [address]   Phone: [phone]  Fax: [phone]        October 10, 2008 MRN: [id number]    [facility] [address] [name], [place]  [address]    Dear Ms. Mulhearn,  I am pleased to inform you that the biopsies taken during your recent endoscopic examination did not show any evidence of cancer upon pathologic examination.  However, your biopsies indicate you have a condition known as Barrett's esophagus. While not cancer, it is pre-cancerous (can progress to cancer) and needs to be monitored with repeat endoscopic examination and biopsies.  Fortunately, it is quite rare that this develops into cancer, but careful monitoring of the condition along with taking your medication as prescribed is important in reducing the risk of developing cancer.  It is my recommendation that you have a repeat upper gastrointestinal endoscopic examination in _ years.  Additional information/recommendations:  __Please call [phone] to schedule a return visit to further      evaluate your condition.  _xx_Continue with treatment plan as outlined the day of your exam.  Please call us if you have or develop heartburn, reflux symptoms, any swallowing problems, or if you have questions about your condition that have not been fully answered at this time.  Sincerely,  Mardella Layman MD [facility]  This letter has been electronically signed by your physician.

## 2010-11-20 NOTE — Procedures (Signed)
Summary: EGD   EGD  Procedure date:  10/05/2008  Findings:      Location: [facility]    ENDOSCOPY PROCEDURE REPORT  PATIENT:  [name], [name]  MR#:  [id number] BIRTHDATE:   1951-04-19   GENDER:   female  ENDOSCOPIST:   Vania Rea. Jarold Motto, MD, [facility] Referred by:   PROCEDURE DATE:  10/05/2008 PROCEDURE:  EGD with biopsy for H. pylori ASA CLASS:   Class II INDICATIONS: GERD, chest pain   MEDICATIONS:    Fentanyl 50 mcg IV, Versed 7 mg IV TOPICAL ANESTHETIC:   Exactacain Spray  DESCRIPTION OF PROCEDURE:   After the risks benefits and alternatives of the procedure were thoroughly explained, informed consent was obtained.  The LB GIF-H180 [id number] endoscope was introduced through the mouth and advanced to the second portion of the duodenum, without limitations.  The instrument was slowly withdrawn as the mucosa was fully examined. <<PROCEDUREIMAGES>>        <<OLD IMAGES>>  A hiatal hernia was found at the gastroesophageal junction. large HH noted.  Barrett's esophagus was found at the gastroesophageal junction. biopsies done.  Moderate gastritis was found in the body and the antrum of the stomach. CLO Bx. done.  The upper, middle, and distal third of the esophagus were carefully inspected and no abnormalities were noted. The z-line was well seen at the GEJ. The endoscope was pushed into the fundus which was normal including a retroflexed view. The antrum,gastric body, first and second part of the duodenum were unremarkable. in the descending duodenum. SI Bx done.R/O CELIAC DISEASE.    Retroflexed views revealed a hiatal hernia.    The scope was then withdrawn from the patient and the procedure completed.  COMPLICATIONS:   None  ENDOSCOPIC IMPRESSION:  1) Hiatal hernia at the gastroesophageal junction  2) Barrett's esophagus at the gastroesophageal junction  3) Moderate gastritis in the body and the antrum of the stomach  4) Normal EGD in the descending duodenum  5) A  hiatal hernia RECOMMENDATIONS:  1) await biopsy results  2) continue current meds  3) follow-up: GI clinic 1 month(s)  REPEAT EXAM:   No   _______________________________ Vania Rea. Jarold Motto, MD, [facility]    CC:        REPORT OF SURGICAL PATHOLOGY   Case #: [id number] Patient Name: [name], [name] Office Chart Number:  [id number]   MRN: [id number] Pathologist: Alden Server A. Delila Spence, MD DOB/Age  26-Feb-1951 (Age: 60)    Gender: F Date Taken:  10/05/2008 Date Received: 10/06/2008   FINAL DIAGNOSIS   ***MICROSCOPIC EXAMINATION AND DIAGNOSIS***   1.  SMALL BOWEL BIOPSY:  BENIGN SMALL BOWEL MUCOSA.  NO VILLOUS ATROPHY, INFLAMMATION OR OTHER ABNORMALITIES PRESENT.   2.  STOMACH, BIOPSIES:  BENIGN GASTRIC BODY TYPE MUCOSA WITH MILD CHRONIC INFLAMMATION.  NO INTESTINAL METAPLASIA, DYSPLASIA OR MALIGNANCY IDENTIFIED.     3.  ESOPHAGUS:  INTESTINAL METAPLASIA (GOBLET CELL METAPLASIA) CONSISTENT WITH BARRETT' S ESOPHAGUS.  NO DYSPLASIA OR MALIGNANCY IDENTIFIED.   COMMENT 1.  There is small bowel mucosa with normal villous architecture and no objective increase in inflammation.  No villous atrophy, active inflammation or other significant changes identified.   2.  A Warthin-Starry stain is performed to determine the possibility of the presence of Helicobacter pylori. The Warthin-Starry stain is negative for organisms of Helicobacter pylori. The control(s) stained appropriately.   3.  An Alcian Blue stain is performed to determine the presence of intestinal metaplasia (goblet cell metaplasia). The Alcian Blue stain shows  intestinal metaplasia (goblet cell metaplasia).  The control stained appropriately.   cc Date Reported:  10/07/2008     Alden Server A. Delila Spence, MD *** Electronically Signed Out By EAA ***   Clinical information Pain, belching, rule out Barrett' s esophagus (3), rule out sprue (1), rule out h. pylori (2) (mj)   specimen(s) obtained 1: Small intestine,  biopsy 2: Stomach, biopsy, antrum 3: Esophagus, biopsy   Gross Description 1.  Received in formalin are tan, soft tissue fragments that are submitted in toto.   Number:  four. Size:  0.1 cm up to 0.3 cm   2.  Received in formalin are tan, soft tissue fragments that are submitted in toto.  Number:  two. Size:  0.5 cm   3.  Received in formalin are tan, soft tissue fragments that are submitted in toto.   Number:  four. Size:  0.1 cm up to 0.3 cm.  (SP:gt, 10/06/08)   gdt/     Signed by Mardella Layman MD FACG,FAGA on 10/10/2008 at 8:19 AM  ________________________________________________________________________ EGD followup 2 years   Signed by Mardella Layman MD FACG,FAGA on 10/10/2008 at 8:19 AM  October 10, 2008 MRN: [id number]    [facility] [address] [name], [place]  [address]    Dear Ms. Canova,  I am pleased to inform you that the biopsies taken during your recent endoscopic examination did not show any evidence of cancer upon pathologic examination.  However, your biopsies indicate you have a condition known as Barrett's esophagus. While not cancer, it is pre-cancerous (can progress to cancer) and needs to be monitored with repeat endoscopic examination and biopsies.  Fortunately, it is quite rare that this develops into cancer, but careful monitoring of the condition along with taking your medication as prescribed is important in reducing the risk of developing cancer.  It is my recommendation that you have a repeat upper gastrointestinal endoscopic examination in _ years.  Additional information/recommendations:  __Please call [phone] to schedule a return visit to further      evaluate your condition.  _xx_Continue with treatment plan as outlined the day of your exam.  Please call us if you have or develop heartburn, reflux symptoms, any swallowing problems, or if you have questions about your condition that have not been fully  answered at this time.  Sincerely,  Mardella Layman MD [facility]  This letter has been electronically signed by your physician.  This report was created from the original endoscopy report, which was reviewed and signed by the above listed endoscopist.

## 2010-11-22 NOTE — Letter (Signed)
Summary: Endoscopy Letter  [facility]  [address]   [place], [place] [address]   Phone: [phone]  Fax: [phone]      November 06, 2010 MRN: [id number]   [facility] [address] [name], [place]  [address]   Dear Ms. Inch,   According to your medical record, it is time for you to schedule an Endoscopy. Endoscopic screening is recommended for patients with certain upper digestive tract conditions because of associated increased risk for cancers of the upper digestive system.  This letter has been generated based on the recommendations made at the time of your prior procedure. If you feel that in your particular situation this may no longer apply, please contact our office.  Please call our office at [phone]) to schedule this appointment or to update your records at your earliest convenience.  Thank you for cooperating with [place] to provide you with the very best care possible.   Sincerely,   Vania Rea. Jarold Motto, M.D.  [facility] [phone]

## 2010-12-18 ENCOUNTER — Telehealth: Payer: Self-pay | Admitting: Gastroenterology

## 2010-12-18 ENCOUNTER — Encounter (INDEPENDENT_AMBULATORY_CARE_PROVIDER_SITE_OTHER): Payer: Self-pay | Admitting: *Deleted

## 2010-12-27 NOTE — Progress Notes (Signed)
Summary: Medication refill  Phone Note Call from Patient Call back at Home Phone [phone]   Caller: Patient Call For: Dr. Jarold Motto Reason for Call: Refill Medication Summary of Call: Needs a refill on her PANTOPRAZOLE....[facility] [address] [facility] surgeon. Sch'd Endo for 02-15-11 Initial call taken by: Karna Christmas,  December 18, 2010 9:15 AM  Follow-up for Phone Call        one month supply sent until endo is done.  Follow-up by: Harlow Mares CMA (AAMA),  December 18, 2010 9:22 AM    Prescriptions: PANTOPRAZOLE SODIUM 40 MG TBEC (PANTOPRAZOLE SODIUM) take one tablet by mouth once daily....NEEDS ENDOSCOPY  #30 x 0   Entered by:   Harlow Mares CMA (AAMA)   Authorized by:   Mardella Layman MD [facility]   Signed by:   Harlow Mares CMA (AAMA) on 12/18/2010   Method used:   Electronically to        [facility] [phone]* (retail)       [address]       [place], [place]  [address]       Ph: [phone]       Fax: [phone]   RxID:   [id number]

## 2010-12-27 NOTE — Letter (Signed)
Summary: Pre Visit Letter Revised  [facility]  [address] [place], [place] [address]   Phone: [phone]  Fax: [phone]        12/18/2010 MRN: [id number] [facility] [address] [name], [place]  [address]             Procedure Date:  02-15-11           Recall Endo---Dr. Jarold Motto   Welcome to the Gastroenterology Division at [facility].    You are scheduled to see a nurse for your pre-procedure visit on 02-01-11 at 8:00a.m. on the 3rd floor at [facility], [address]  We ask that you try to arrive at our office 15 minutes prior to your appointment time to allow for check-in.  Please take a minute to review the attached form.  If you answer "Yes" to one or more of the questions on the first page, we ask that you call the person listed at your earliest opportunity.  If you answer "No" to all of the questions, please complete the rest of the form and bring it to your appointment.    Your nurse visit will consist of discussing your medical and surgical history, your immediate family medical history, and your medications.   If you are unable to list all of your medications on the form, please bring the medication bottles to your appointment and we will list them.  We will need to be aware of both prescribed and over the counter drugs.  We will need to know exact dosage information as well.    Please be prepared to read and sign documents such as consent forms, a financial agreement, and acknowledgement forms.  If necessary, and with your consent, a friend or relative is welcome to sit-in on the nurse visit with you.  Please bring your insurance card so that we may make a copy of it.  If your insurance requires a referral to see a specialist, please bring your referral form from your primary care physician.  No co-pay is required for this nurse visit.     If you cannot keep your appointment, please call [phone] to cancel or reschedule  prior to your appointment date.  This allows [place] the opportunity to schedule an appointment for another patient in need of care.    Thank you for choosing [facility] for your medical needs.  We appreciate the opportunity to care for you.  Please visit [place] at our website  to learn more about our practice.  Sincerely, The Gastroenterology Division

## 2011-01-16 ENCOUNTER — Other Ambulatory Visit: Payer: Self-pay | Admitting: Gastroenterology

## 2011-01-30 LAB — CBC
HCT: 35 % — ABNORMAL LOW (ref 36.0–46.0)
HCT: 35 % — ABNORMAL LOW (ref 36.0–46.0)
Hemoglobin: 12 g/dL (ref 12.0–15.0)
Hemoglobin: 12.1 g/dL (ref 12.0–15.0)
MCHC: 34.4 g/dL (ref 30.0–36.0)
MCHC: 34.7 g/dL (ref 30.0–36.0)
MCV: 93.4 fL (ref 78.0–100.0)
MCV: 93.9 fL (ref 78.0–100.0)
Platelets: 232 10*3/uL (ref 150–400)
Platelets: 263 10*3/uL (ref 150–400)
RBC: 3.72 MIL/uL — ABNORMAL LOW (ref 3.87–5.11)
RBC: 3.75 MIL/uL — ABNORMAL LOW (ref 3.87–5.11)
RDW: 13 % (ref 11.5–15.5)
RDW: 13 % (ref 11.5–15.5)
WBC: 9.7 10*3/uL (ref 4.0–10.5)
WBC: 9.9 10*3/uL (ref 4.0–10.5)

## 2011-01-30 LAB — DIFFERENTIAL
Basophils Absolute: 0 10*3/uL (ref 0.0–0.1)
Basophils Absolute: 0.1 10*3/uL (ref 0.0–0.1)
Basophils Relative: 0 % (ref 0–1)
Basophils Relative: 1 % (ref 0–1)
Eosinophils Absolute: 0.1 10*3/uL (ref 0.0–0.7)
Eosinophils Absolute: 0.1 10*3/uL (ref 0.0–0.7)
Eosinophils Relative: 1 % (ref 0–5)
Eosinophils Relative: 1 % (ref 0–5)
Lymphocytes Relative: 12 % (ref 12–46)
Lymphocytes Relative: 20 % (ref 12–46)
Lymphs Abs: 1.2 10*3/uL (ref 0.7–4.0)
Lymphs Abs: 1.9 10*3/uL (ref 0.7–4.0)
Monocytes Absolute: 0.8 10*3/uL (ref 0.1–1.0)
Monocytes Absolute: 0.9 10*3/uL (ref 0.1–1.0)
Monocytes Relative: 8 % (ref 3–12)
Monocytes Relative: 9 % (ref 3–12)
Neutro Abs: 6.9 10*3/uL (ref 1.7–7.7)
Neutro Abs: 7.7 10*3/uL (ref 1.7–7.7)
Neutrophils Relative %: 71 % (ref 43–77)
Neutrophils Relative %: 78 % — ABNORMAL HIGH (ref 43–77)

## 2011-01-31 LAB — HEMOGLOBIN AND HEMATOCRIT, BLOOD
HCT: 39.8 % (ref 36.0–46.0)
Hemoglobin: 13.6 g/dL (ref 12.0–15.0)

## 2011-01-31 LAB — BASIC METABOLIC PANEL
BUN: 11 mg/dL (ref 6–23)
CO2: 28 mEq/L (ref 19–32)
Calcium: 9 mg/dL (ref 8.4–10.5)
Chloride: 100 mEq/L (ref 96–112)
Creatinine, Ser: 0.71 mg/dL (ref 0.4–1.2)
GFR calc Af Amer: >60 mL/min (ref >60)
GFR calc non Af Amer: >60 mL/min (ref >60)
Glucose, Bld: 97 mg/dL (ref 70–99)
Potassium: 4.2 mEq/L (ref 3.5–5.1)
Sodium: 135 mEq/L (ref 135–145)

## 2011-02-15 ENCOUNTER — Other Ambulatory Visit: Payer: Self-pay | Admitting: Gastroenterology

## 2011-02-18 ENCOUNTER — Other Ambulatory Visit: Payer: Self-pay | Admitting: Gastroenterology

## 2011-03-05 NOTE — Discharge Summary (Signed)
[name], [name]                ACCOUNT NO.:  [id number]   MEDICAL RECORD NO.:  [id number]          PATIENT TYPE:  INP   LOCATION:  1529                         FACILITY:  [facility]   PHYSICIAN:  Thornton Park. Daphine Deutscher, MD  DATE OF BIRTH:  [birth year]   DATE OF ADMISSION:  01/18/2009  DATE OF DISCHARGE:  01/20/2009                               DISCHARGE SUMMARY   ADMITTING DIAGNOSIS:  Gastroesophageal reflux disease.   DISCHARGE DIAGNOSIS:  Gastroesophageal reflux disease.   PROCEDURE:  Laparoscopic Nissen fundoplication.   COURSE IN THE HOSPITAL:  Mrs. Brach underwent a lap Nissen and on  postop day #1 underwent a swallow which showed a good wrap and no  evidence of extravasation.  Postop day #2, she was taking clear liquids  well and was ready to go home and arrangements were made for follow-up  in the office around 01/30/2009.   DISCHARGE MEDICINES:  Include Phenergan suppositories as needed for  nausea, although she was not having any of this, it is a prophylactic  and some Lortab elixir for pain if needed.   The incisions looked good and she was returned to her other preoperative  meds.   CONDITION:  Stable      Matthew B. Daphine Deutscher, MD  Electronically Signed     MBM/MEDQ  D:  01/20/2009  T:  01/20/2009  Job:  [id number]

## 2011-03-05 NOTE — Op Note (Signed)
[name], [name]                ACCOUNT NO.:  [id number]   MEDICAL RECORD NO.:  [id number]          PATIENT TYPE:  AMB   LOCATION:  DAY                          FACILITY:  [facility]   PHYSICIAN:  [name]. Daphine Deutscher, MD  DATE OF BIRTH:  01-19-1951   DATE OF PROCEDURE:  01/18/2009  DATE OF DISCHARGE:                               OPERATIVE REPORT   PREOPERATIVE INDICATIONS:  Gastroesophageal reflux disease with hiatal  hernia.   POSTOPERATIVE DIAGNOSES:  A moderate-sized hiatal hernia,  gastroesophageal reflux disease, status post laparoscopic Nissen  fundoplication and 2 posterior pledgeted suture closure of the hiatus.   SURGEON:  [name]. Daphine Deutscher, MD   ASSISTANT:  Charlotte Lint, MD   ANESTHESIA:  General endotracheal.   DESCRIPTION OF PROCEDURE:  [name] was taken to room 1 on January 18, 2009 and given general anesthesia.  The abdomen was prepped with a  Techni-Care equivalent and draped sterilely.  The left upper quadrant  was then palpated and was used for OptiView placement.  Initial pass  seemed to be outside the peritoneum.  This was redirected and went end.  I saw some evidence of pneumatosis around the left colon.  Standard  trocar placements were used.  I carefully surveyed this area and looked  for evidence of trocar injury to the bowel and saw none.  This was  repeated at the end of the case and the only change had been there had  been resorption of the gas.  No evidence of distention of the colon.  The Nathanson's retractor was placed in the upper midline and we began  dissection of the foregut.  I grasped the stomach with a Babcock and was  able to reduce the fairly sizable hiatal hernia.  Harmonic scalpel was  used to cut along the gastrohepatic omentum and go into the right crus  where I was able to take the sac out and mobilize the esophagus.  I got  beneath the esophagus and put a Penrose around this EG junction.  We  kept good traction and I completely freed  the esophagogastric junction.  There was no tendency for it to go back up in the chest.  We mobilized  that segment of the esophagus nicely.   I then repaired the hiatus initially with 3 sutures pledgeted  posteriorly.  Dr. __________ then passed the 50 bougie and had some  difficulty actually getting it to come across the closure.  I went ahead  and took out the last one I placed and this allowed the bougie to pass.  Meanwhile, we taken down some short gastric vessels and had a very  floppy mobile cardia which we could use to invaginate the distal  esophagus.  I then brought this around and invaginated the distal  esophagus and plicated it over the distal esophagus with three sutures.  The uppermost one got good bites of all three stomach, esophagus,  stomach held in place with Ti-KNOT .  The two subsequent ones again got  purchases of stomach, esophagus, stomach and were tied with  intracorporeal knots.  The  hiatal closure was closed with pledgeted  sutures using the Endo stitch and with tied knots tied with a suture  passer.   At the end of the case the repair looked good and when no bleeding was  seen.  Also we checked the left colon again.  It appeared to be  improving in terms of pneumatosis and no evidence of a bowel injury.  The patient tolerated procedure well and was taken to recovery room in  satisfactory condition.      [name] Daphine Deutscher, MD  Electronically Signed     MBM/MEDQ  D:  01/18/2009  T:  01/18/2009  Job:  [id number]   cc:   Charlotte Rea. Jarold Motto, MD, FACG, FACP, FAGA  [address]  [place]  [place] [address]   [name]  Fax: [phone]

## 2011-03-19 ENCOUNTER — Other Ambulatory Visit: Payer: Self-pay | Admitting: Gastroenterology

## 2011-03-20 ENCOUNTER — Other Ambulatory Visit: Payer: Self-pay | Admitting: Gastroenterology

## 2011-03-20 NOTE — Telephone Encounter (Signed)
Advised pt that the last time she called for a month refill she cx her endo after she got her refill I have advised her that she can get prilosec OTC and take until her EGD. Then she will be given an rx for protonix.

## 2011-03-21 ENCOUNTER — Other Ambulatory Visit: Payer: Self-pay | Admitting: Obstetrics and Gynecology

## 2011-03-29 ENCOUNTER — Ambulatory Visit (AMBULATORY_SURGERY_CENTER): Payer: BC Managed Care – PPO | Admitting: *Deleted

## 2011-03-29 VITALS — Ht 64.0 in | Wt 157.1 lb

## 2011-03-29 DIAGNOSIS — K2270 Barrett's esophagus without dysplasia: Secondary | ICD-10-CM

## 2011-04-12 ENCOUNTER — Encounter: Payer: Self-pay | Admitting: Gastroenterology

## 2011-04-12 ENCOUNTER — Ambulatory Visit (AMBULATORY_SURGERY_CENTER): Payer: BC Managed Care – PPO | Admitting: Gastroenterology

## 2011-04-12 VITALS — BP 123/62 | HR 88 | Temp 98.1°F | Resp 16 | Ht 64.0 in | Wt 156.0 lb

## 2011-04-12 DIAGNOSIS — R141 Gas pain: Secondary | ICD-10-CM | POA: Insufficient documentation

## 2011-04-12 DIAGNOSIS — K2270 Barrett's esophagus without dysplasia: Secondary | ICD-10-CM

## 2011-04-12 DIAGNOSIS — K219 Gastro-esophageal reflux disease without esophagitis: Secondary | ICD-10-CM

## 2011-04-12 DIAGNOSIS — D1330 Benign neoplasm of unspecified part of small intestine: Secondary | ICD-10-CM

## 2011-04-12 DIAGNOSIS — Z9889 Other specified postprocedural states: Secondary | ICD-10-CM

## 2011-04-12 DIAGNOSIS — K2980 Duodenitis without bleeding: Secondary | ICD-10-CM

## 2011-04-12 HISTORY — DX: Other specified postprocedural states: Z98.890

## 2011-04-12 MED ORDER — SODIUM CHLORIDE 0.9 % IV SOLN: 500.0000 mL | INTRAVENOUS | Status: DC

## 2011-04-12 NOTE — Patient Instructions (Signed)
Discharge instructions given with verbal understanding. Biopsies taken. Resume previous medications. 

## 2011-04-15 ENCOUNTER — Telehealth: Payer: Self-pay

## 2011-04-15 NOTE — Telephone Encounter (Signed)

## 2011-04-19 ENCOUNTER — Encounter: Payer: Self-pay | Admitting: Gastroenterology

## 2011-05-14 ENCOUNTER — Ambulatory Visit (INDEPENDENT_AMBULATORY_CARE_PROVIDER_SITE_OTHER): Payer: BC Managed Care – PPO | Admitting: Gastroenterology

## 2011-05-14 ENCOUNTER — Encounter: Payer: Self-pay | Admitting: Gastroenterology

## 2011-05-14 ENCOUNTER — Other Ambulatory Visit: Payer: BC Managed Care – PPO

## 2011-05-14 DIAGNOSIS — K219 Gastro-esophageal reflux disease without esophagitis: Secondary | ICD-10-CM

## 2011-05-14 DIAGNOSIS — IMO0001 Reserved for inherently not codable concepts without codable children: Secondary | ICD-10-CM | POA: Insufficient documentation

## 2011-05-14 DIAGNOSIS — Z9889 Other specified postprocedural states: Secondary | ICD-10-CM

## 2011-05-14 DIAGNOSIS — R141 Gas pain: Secondary | ICD-10-CM

## 2011-05-14 DIAGNOSIS — K2270 Barrett's esophagus without dysplasia: Secondary | ICD-10-CM | POA: Insufficient documentation

## 2011-05-14 DIAGNOSIS — R142 Eructation: Secondary | ICD-10-CM

## 2011-05-14 DIAGNOSIS — R140 Abdominal distension (gaseous): Secondary | ICD-10-CM

## 2011-05-14 MED ORDER — ALIGN PO CAPS: 1.0000 | ORAL_CAPSULE | Freq: Every day | ORAL | Status: AC

## 2011-05-14 MED ORDER — RIFAXIMIN 550 MG PO TABS: 550.0000 mg | ORAL_TABLET | Freq: Two times a day (BID) | ORAL | Status: DC

## 2011-05-14 MED ORDER — HYOSCYAMINE SULFATE 0.125 MG SL SUBL: 0.1250 mg | SUBLINGUAL_TABLET | SUBLINGUAL | Status: DC | PRN

## 2011-05-14 NOTE — Patient Instructions (Addendum)
Please go to the basement today for your labs.  Your prescription(s) have been sent to you pharmacy.  Take the Align samples while on the Xifaxan. Call back when finished with treatment to let us know how your doing.

## 2011-05-14 NOTE — Progress Notes (Signed)
This is a 60 year old Caucasian female with chronic gas and bloating since childhood. She has chronic GERD and is status post fundoplication with recent endoscopy in June showing a good wrap, a small segment of Barrett's mucosa without dysplasia, and negative small bowel biopsy and H. pylori exam. He currently is on Prilosec 20 mg in the morning and Pepcid 20 mg at bedtime with control of her reflux symptoms. She continues with gas, bloating, but no real bowel irregularity. There is no history of hepatitis, pancreatitis, chronic malabsorption, Raynaud's phenomenon or collagen vascular disease. Family history is noncontributory. She has eliminated sorbitol, lactose, and other nonabsorbable carbohydrates from her diet. 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 There is no history of current skin rash, arthritis, or systemic complaints such as fever or chills.  Current Medications, Allergies, Past Medical History, Past Surgical History, Family History and Social History were reviewed in [employer] record.  Pertinent Review of Systems Negative.. status post total abdominal hysterectomy and ovarian removal many years ago for endometriosis.   Physical Exam: Awake and alert appears stated age. I cannot appreciate stigmata of chronic liver disease. Abdominal exam shows slight distention but no organomegaly, masses, tenderness, or abnormal bowel sounds. Peripheral extremities are unremarkable mental status is normal.    Assessment and Plan: Suspected bacterial overgrowth syndrome versus gluten sensitivity. As mentioned above, small bowel biopsy was negative, but I have ordered celiac serologies for review. I have placed her on Xifaxan 550 mg twice a day for 2 weeks with Align pro-biotic therapy. She is to continue her acid reflux management as previously outlined. I have recommended endoscopy and dysplasia biopsies every 3  years. Also prescribed when necessary sublingual Levsin as needed.  Please copy her primary care physician, referring physician, and pertinent subspecialists... Dr. Luretha Murphy in surgery No diagnosis found.

## 2011-05-15 LAB — CELIAC PANEL 10
Endomysial Screen: NEGATIVE
Gliadin IgA: 6.7 U/mL (ref <20)
Gliadin IgG: 9.5 U/mL (ref <20)
IgA: 224 mg/dL (ref 69–380)
Tissue Transglut Ab: 16 U/mL (ref <20)
Tissue Transglutaminase Ab, IgA: 4.4 U/mL (ref <20)

## 2011-05-17 ENCOUNTER — Telehealth: Payer: Self-pay | Admitting: Gastroenterology

## 2011-05-17 NOTE — Telephone Encounter (Signed)
Labs normal.

## 2011-06-19 ENCOUNTER — Other Ambulatory Visit: Payer: Self-pay | Admitting: Dermatology

## 2011-06-21 ENCOUNTER — Other Ambulatory Visit: Payer: Self-pay | Admitting: Internal Medicine

## 2011-06-21 DIAGNOSIS — Z1231 Encounter for screening mammogram for malignant neoplasm of breast: Secondary | ICD-10-CM

## 2011-08-16 ENCOUNTER — Ambulatory Visit
Admission: RE | Admit: 2011-08-16 | Discharge: 2011-08-16 | Disposition: A | Discharge status: Home / Self Care | Payer: BC Managed Care – PPO | Source: Ambulatory Visit | Attending: Internal Medicine | Admitting: Internal Medicine

## 2011-08-16 DIAGNOSIS — Z1231 Encounter for screening mammogram for malignant neoplasm of breast: Secondary | ICD-10-CM

## 2012-07-16 ENCOUNTER — Other Ambulatory Visit: Payer: Self-pay | Admitting: Internal Medicine

## 2012-07-16 DIAGNOSIS — Z1231 Encounter for screening mammogram for malignant neoplasm of breast: Secondary | ICD-10-CM

## 2012-08-17 ENCOUNTER — Ambulatory Visit
Admission: RE | Admit: 2012-08-17 | Discharge: 2012-08-17 | Disposition: A | Discharge status: Home / Self Care | Payer: BC Managed Care – PPO | Source: Ambulatory Visit | Attending: Internal Medicine | Admitting: Internal Medicine

## 2012-08-17 DIAGNOSIS — Z1231 Encounter for screening mammogram for malignant neoplasm of breast: Secondary | ICD-10-CM

## 2013-03-26 DIAGNOSIS — R109 Unspecified abdominal pain: Secondary | ICD-10-CM

## 2013-03-29 ENCOUNTER — Other Ambulatory Visit (INDEPENDENT_AMBULATORY_CARE_PROVIDER_SITE_OTHER): Payer: BC Managed Care – PPO

## 2013-03-29 ENCOUNTER — Telehealth: Payer: Self-pay | Admitting: Gastroenterology

## 2013-03-29 ENCOUNTER — Ambulatory Visit (INDEPENDENT_AMBULATORY_CARE_PROVIDER_SITE_OTHER): Payer: BC Managed Care – PPO | Admitting: Nurse Practitioner

## 2013-03-29 ENCOUNTER — Encounter: Payer: Self-pay | Admitting: *Deleted

## 2013-03-29 ENCOUNTER — Encounter: Payer: Self-pay | Admitting: Nurse Practitioner

## 2013-03-29 VITALS — BP 120/76 | HR 110 | Ht 64.0 in | Wt 146.0 lb

## 2013-03-29 DIAGNOSIS — R933 Abnormal findings on diagnostic imaging of other parts of digestive tract: Secondary | ICD-10-CM | POA: Insufficient documentation

## 2013-03-29 DIAGNOSIS — R197 Diarrhea, unspecified: Secondary | ICD-10-CM

## 2013-03-29 LAB — BASIC METABOLIC PANEL
BUN: 7 mg/dL (ref 6–23)
CO2: 26 mEq/L (ref 19–32)
Calcium: 8.8 mg/dL (ref 8.4–10.5)
Chloride: 105 mEq/L (ref 96–112)
Creatinine, Ser: 0.7 mg/dL (ref 0.4–1.2)
GFR: 92.3 mL/min (ref >60.00)
Glucose, Bld: 86 mg/dL (ref 70–99)
Potassium: 3.6 mEq/L (ref 3.5–5.1)
Sodium: 140 mEq/L (ref 135–145)

## 2013-03-29 MED ORDER — METRONIDAZOLE 500 MG PO TABS: ORAL_TABLET | ORAL | Status: DC

## 2013-03-29 NOTE — Telephone Encounter (Signed)
Pt reports n/v last Wednesday with abdominal cramping, fever and then diarrhea. Friday night she went to the ER in [place] and had an U/S and CT and received fluids. She reports the CT showed thickening suggestive of Diverticulitis. Last COLON 09/05/2008 showed diverticulosis from descending to sigmoid are. She received fluids and IV antibiotics at the ER and was placed on Augmentin. Pt states n/v, Cramping has cleared, but diarrhea continues. Pt to see Willette Cluster, NP this am.

## 2013-03-29 NOTE — Progress Notes (Signed)
  History of Present Illness:  Patient is a 62 year old female known to Dr. Jarold Motto. She has chronic gas and bloating, chronic GERD / Barrett's esophagus and is status post [employer] fundoplication.   Patient was seen in the emergency department at [facility] a couple of days ago for evaluation of fever, abdominal pain, nausea, and diarrhea. Fever, pain and nausea started early last week followed by onset of diarrhea  Friday morning. She went to ED Friday evening. Ultrasound of the abdomen was negative. Labs including LFTs and amylase were normal. WBC normal. H. pylori serology negative. Hemoglobin normal at 14.7. Urinalysis unremarkable. CT scan of the abdomen and pelvis with contrast revealed diffuse diverticulosis of the mid sigmoid without definite evidence of diverticulitis but some minimal associated wall thickening. Patient was given Augmentin which she started Friday (3 days ago). Nausea overall better. Fever resolved. Abdominal pain better but having persistent diarrhea. Having multiple (up to 20 times a day) foul smelling loose stools a day with some associated incontinence. No antibiotics in last few months (except for Augmentin she is taking at present). No out of country travel. No sick contacts. Diarrhea not any better on Augmentin.    Current Medications, Allergies, Past Medical History, Past Surgical History, Family History and Social History were reviewed in [employer] record.  Physical Exam: General: Well developed , white female in no acute distress Head: Normocephalic and atraumatic Eyes:  sclerae anicteric, conjunctiva pink  Ears: Normal auditory acuity Lungs: Clear throughout to auscultation Heart: Regular rate and rhythm Abdomen: Soft, non distended, mild diffuse tenderness. No masses, no hepatomegaly. Normal bowel sounds Musculoskeletal: Symmetrical with no gross deformities  Extremities: No edema  Neurological: Alert oriented x 4, grossly  nonfocal Psychological:  Alert and cooperative. Normal mood and affect  Assessment and Recommendations:  1. Acute diarrhea. Diarrhea initially associated with nausea, fever and diffuse abdominal pain. CTscan reveals minimal wall thickening of the sigmoid, diverticulosis without definite diverticulitis. I suspect patient actually has infectious colitis. She does not look toxic. Plan:  discontinue Augmentin.  Check electrolytes  Push fluids  Check stool studies then start Flagyl  Use Imodium as directed  Will call her with lab results and for a condition update.   Work excuse for last Thursday through Wed or Thursday of this week.

## 2013-03-29 NOTE — Patient Instructions (Addendum)
Please go to the basement level to have your labs drawn and have stool studies. We will call you with the results. We sent the prescription for the Metronidazole ( Flagyl) to [facility] at [address] Stop the Augmentin.  You may take Imodium Over the Counter as directed.  We have given you a work excuse from last Thursday 6-5 thru this Wed or Thurs, if you feel well enough to go back.  If not please call us and we will extend it.

## 2013-03-30 ENCOUNTER — Other Ambulatory Visit: Payer: BC Managed Care – PPO

## 2013-03-30 DIAGNOSIS — R197 Diarrhea, unspecified: Secondary | ICD-10-CM

## 2013-03-30 DIAGNOSIS — R933 Abnormal findings on diagnostic imaging of other parts of digestive tract: Secondary | ICD-10-CM

## 2013-03-31 LAB — CLOSTRIDIUM DIFFICILE BY PCR: Toxigenic C. Difficile by PCR: NOT DETECTED

## 2013-03-31 LAB — FECAL LACTOFERRIN, QUANT: Lactoferrin: POSITIVE

## 2013-04-02 ENCOUNTER — Telehealth: Payer: Self-pay | Admitting: Nurse Practitioner

## 2013-04-02 NOTE — Telephone Encounter (Signed)
Unable to reach patient. Will try again later. 

## 2013-04-02 NOTE — Telephone Encounter (Signed)
See results note. 

## 2013-04-03 LAB — STOOL CULTURE

## 2013-04-06 ENCOUNTER — Encounter: Payer: Self-pay | Admitting: Gastroenterology

## 2013-08-02 ENCOUNTER — Telehealth: Payer: Self-pay | Admitting: Gastroenterology

## 2013-08-02 NOTE — Telephone Encounter (Signed)
Pt had COLON on 09/05/2008; she had no polyps, only diverticulosis and she reports no family hx or changes in bowel habits. Based on this, informed pt her recall should be in 2019. Reminded her of EGD due next year for Barrett's surveillance; pt stated understanding.

## 2013-09-08 ENCOUNTER — Other Ambulatory Visit: Payer: Self-pay | Admitting: Dermatology

## 2014-05-12 ENCOUNTER — Telehealth: Payer: Self-pay | Admitting: Internal Medicine

## 2014-05-12 NOTE — Telephone Encounter (Signed)
Patient is having a lot of discomfort and soreness under her rib cage. She wants to be seen sooner because she is so uncomfortable. Appointment scheduled with J Zehr for 05/19/14.

## 2014-05-13 ENCOUNTER — Encounter: Payer: Self-pay | Admitting: *Deleted

## 2014-05-19 ENCOUNTER — Ambulatory Visit (INDEPENDENT_AMBULATORY_CARE_PROVIDER_SITE_OTHER): Payer: BC Managed Care – PPO | Admitting: Gastroenterology

## 2014-05-19 ENCOUNTER — Encounter: Payer: Self-pay | Admitting: Gastroenterology

## 2014-05-19 VITALS — BP 128/78 | HR 72 | Ht 64.0 in | Wt 160.2 lb

## 2014-05-19 DIAGNOSIS — R1013 Epigastric pain: Secondary | ICD-10-CM

## 2014-05-19 DIAGNOSIS — R140 Abdominal distension (gaseous): Secondary | ICD-10-CM

## 2014-05-19 DIAGNOSIS — R143 Flatulence: Secondary | ICD-10-CM

## 2014-05-19 DIAGNOSIS — K2270 Barrett's esophagus without dysplasia: Secondary | ICD-10-CM

## 2014-05-19 DIAGNOSIS — R142 Eructation: Secondary | ICD-10-CM

## 2014-05-19 DIAGNOSIS — R141 Gas pain: Secondary | ICD-10-CM

## 2014-05-19 NOTE — Progress Notes (Addendum)
05/19/2014 [name] [id number] [birth year]   History of Present Illness:  This is a 63 year old female who is previously known to Dr. Sharlett Iles. She has a history of Barrett's esophagus/GERD and underwent Nissen fundoplication in 0093. Her last EGD was in June 2012 at which time she was found to have changes consistent with Nissen fundoplication and some duodenitis. Biopsies of the esophagus showed changes consistent with Barrett's without dysplasia. Biopsies of the duodenum were normal. It was recommended that she have a repeat EGD in 3 years from that time.  Her last colonoscopy was in November 2009 at which time she was found have only diverticulosis and it was recommended that she have repeat colonoscopy in 10 years from that time.  She presents to our office today with complaints of epigastric abdominal pain and significant bloating in her upper abdomen along with belching. She says that the symptoms have been present for the past 2 months but over the past 3 weeks they have become very horrible. She is miserable and does not feel like doing anything due to her symptoms. Her pain is in her upper abdomen she describes it as a swelling/bloating sensation. A lot of times it is very painful up under her left rib cage. She describes a burning in her throat.  She tells me that she has been on every acid medication without relief of similar symptoms several years ago prior to her fundoplication. Most recently, she had been taking omeprazole every morning but stopped about 3 weeks ago because it seemed to make her feel worse. She takes Pepcid at bedtime which helps with nighttime reflux. She takes a daily probiotic in the form of align.  She has been using Gas-X several times throughout the day which seems to help, but relief is only transient. She tried Gaviscon as well.  She is convinced that something has happened with her Nissen fundoplication. She says that she feels the same way she did prior to  her surgery.   Current Medications, Allergies, Past Medical History, Past Surgical History, Family History and Social History were reviewed in [employer] record.   Physical Exam: BP 128/78  Pulse 72  Ht 5\' 4"  (1.626 m)  Wt 160 lb 3.2 oz (72.666 kg)  BMI 27.48 kg/m2 General: Well developed white female in no acute distress Head: Normocephalic and atraumatic Eyes:  Sclerae anicteric, conjunctiva pink  Ears: Normal auditory acuity Lungs: Clear throughout to auscultation Heart: Regular rate and rhythm Abdomen: Soft, non-distended.  Normal bowel sounds.  Mild diffuse TTP> in her upper abdomen without R/R/G Musculoskeletal: Symmetrical with no gross deformities  Extremities: No edema  Neurological: Alert oriented x 4, grossly non-focal Psychological:  Alert and cooperative. Normal mood and affect  Assessment and Recommendations: -History of Barrett's esophagus:  Was due for 3 year EGD recall in June.  Will schedule EGD with Dr. Hilarie Fredrickson.  The risks, benefits, and alternatives were discussed with the patient and she consents to proceed.  -Epigastric abdominal pain/belching:  She is convinced that something has gone wrong with her previous Nissen fundoplication.  She is getting EGD as stated above, but will check UGI in the interim as well.  She says that she has tried every PPI along with other medications without relief but thinks that pepcid helps her some at bedtime.  She will increase the pepcid 20 mg to three times daily for now. -Bloating:  Likely has IBS/SIBO with several chronic/recurrent GI complaints.  Once again, she  has tried several medications without relief.  Will try the FODMAP diet.  Could consider trial of xifaxan as I am not sure that she has ever been placed on that medication.  ? If this is related to her UGI complaints.   Addendum: Reviewed and agree with initial management. Jerene Bears, MD

## 2014-05-19 NOTE — Patient Instructions (Signed)
You have been scheduled for an endoscopy. Please follow written instructions given to you at your visit today. If you use inhalers (even only as needed), please bring them with you on the day of your procedure. Your physician has requested that you go to www.startemmi.com and enter the access code given to you at your visit today. This web site gives a general overview about your procedure. However, you should still follow specific instructions given to you by our office regarding your preparation for the procedure.  You have been scheduled for an Upper Gastroenterology Series at [facility] for 05-30-2014 at 930 am.  Nothing to eat or drink three hours prior to appointment. Please go to the first floor of [facility], be there fifteen minutes early for registration.    Please increase your Pepcid to three times daily. This can be purchased over the counter.  Please follow FOD MAP Diet given today.

## 2014-05-20 DIAGNOSIS — R1013 Epigastric pain: Secondary | ICD-10-CM | POA: Insufficient documentation

## 2014-05-23 ENCOUNTER — Encounter: Payer: Self-pay | Admitting: Internal Medicine

## 2014-05-30 ENCOUNTER — Ambulatory Visit (HOSPITAL_COMMUNITY)
Admission: RE | Admit: 2014-05-30 | Discharge: 2014-05-30 | Disposition: A | Discharge status: Home / Self Care | Payer: BC Managed Care – PPO | Source: Ambulatory Visit | Attending: Gastroenterology | Admitting: Gastroenterology

## 2014-05-30 DIAGNOSIS — K449 Diaphragmatic hernia without obstruction or gangrene: Secondary | ICD-10-CM | POA: Insufficient documentation

## 2014-05-30 DIAGNOSIS — K2270 Barrett's esophagus without dysplasia: Secondary | ICD-10-CM

## 2014-06-01 ENCOUNTER — Ambulatory Visit (AMBULATORY_SURGERY_CENTER): Payer: BC Managed Care – PPO | Admitting: Internal Medicine

## 2014-06-01 ENCOUNTER — Encounter: Payer: Self-pay | Admitting: Internal Medicine

## 2014-06-01 VITALS — BP 135/84 | HR 71 | Temp 97.5°F | Resp 19 | Ht 64.0 in | Wt 160.0 lb

## 2014-06-01 DIAGNOSIS — K2270 Barrett's esophagus without dysplasia: Secondary | ICD-10-CM

## 2014-06-01 DIAGNOSIS — K2970 Gastritis, unspecified, without bleeding: Secondary | ICD-10-CM

## 2014-06-01 DIAGNOSIS — R141 Gas pain: Secondary | ICD-10-CM

## 2014-06-01 DIAGNOSIS — R143 Flatulence: Secondary | ICD-10-CM

## 2014-06-01 DIAGNOSIS — R142 Eructation: Secondary | ICD-10-CM

## 2014-06-01 DIAGNOSIS — K449 Diaphragmatic hernia without obstruction or gangrene: Secondary | ICD-10-CM

## 2014-06-01 DIAGNOSIS — Z9889 Other specified postprocedural states: Secondary | ICD-10-CM

## 2014-06-01 DIAGNOSIS — K2990 Gastroduodenitis, unspecified, without bleeding: Secondary | ICD-10-CM

## 2014-06-01 DIAGNOSIS — R1012 Left upper quadrant pain: Secondary | ICD-10-CM

## 2014-06-01 DIAGNOSIS — R140 Abdominal distension (gaseous): Secondary | ICD-10-CM

## 2014-06-01 MED ORDER — SODIUM CHLORIDE 0.9 % IV SOLN: 500.0000 mL | INTRAVENOUS | Status: DC

## 2014-06-01 NOTE — Progress Notes (Signed)
Called to room to assist during endoscopic procedure.  Patient ID and intended procedure confirmed with present staff. Received instructions for my participation in the procedure from the performing physician.  

## 2014-06-01 NOTE — Op Note (Signed)
[place]  [address] [place], [id number]   ENDOSCOPY PROCEDURE REPORT  PATIENT: [name], [name]  MR#: [id number] BIRTHDATE: 1951-05-14 , 62  yrs. old GENDER: Female ENDOSCOPIST: Jerene Bears, MD PROCEDURE DATE:  06/01/2014 PROCEDURE:  EGD w/ biopsy ASA CLASS:     Class II INDICATIONS:  history of Barrett's esophagus.   LUQ/Left lower chest pain; abdominal bloating; history of Nissen fundoplication. MEDICATIONS: MAC sedation, administered by CRNA and propofol (Diprivan) 220mg  IV TOPICAL ANESTHETIC: none  DESCRIPTION OF PROCEDURE: After the risks benefits and alternatives of the procedure were thoroughly explained, informed consent was obtained.  The LB [id number] endoscope was introduced through the mouth and advanced to the second portion of the duodenum. Without limitations.  The instrument was slowly withdrawn as the mucosa was fully examined.  ESOPHAGUS: A slightly irregular Z-line was observed 37 cm from the incisors.  Multiple biopsies given history of Barrett's esophagus. The esophagus was otherwise normal.  STOMACH: A Nissen fundoplication wrap was found and was intact. On the greater curvature side there is a small portion of gastric cardia located beside and above the wrap (paraesophageal).  The mucosa of the stomach appeared normal, though there was scant heme noted in the proximal stomach seen on entering the stomach. Biopsies were taken in the gastric body, antrum and angularis.  DUODENUM: The duodenal mucosa showed no abnormalities in the bulb and second portion of the duodenum.  Retroflexed views revealed as previously described.     The scope was then withdrawn from the patient and the procedure completed.  COMPLICATIONS: There were no complications.  ENDOSCOPIC IMPRESSION: 1.   Slightly irregular Z-line was observed 37 cm from the incisors 2.   The esophagus was otherwise normal. 3.   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   The mucosa of the stomach appeared normal; biopsies were taken in the body, antrum and angularis 5.   The duodenal mucosa showed no abnormalities in the bulb and second portion of the duodenum  RECOMMENDATIONS: 1.  Await biopsy results 2.  Trial of rifaximin 550 mg three times daily for 10 day for bloating/bacterial overgrowth 3.  CT chest to evaluate for paraesophageal hernia and her LUQ pain 4.  Office follow-up next available  eSigned:  Jerene Bears, MD 06/01/2014 4:03 PM   CC:The Patient; Eber Hong, MD  PATIENT NAME:  [name], Threat MR#: [id number]

## 2014-06-01 NOTE — Patient Instructions (Signed)
Discharge instructions given with verbal understanding. Biopsies taken.  Office will schedule CT of chest. YOU HAD AN ENDOSCOPIC PROCEDURE TODAY AT [facility]: Refer to the procedure report that was given to you for any specific questions about what was found during the examination.  If the procedure report does not answer your questions, please call your gastroenterologist to clarify.  If you requested that your care partner not be given the details of your procedure findings, then the procedure report has been included in a sealed envelope for you to review at your convenience later.  YOU SHOULD EXPECT: Some feelings of bloating in the abdomen. Passage of more gas than usual.  Walking can help get rid of the air that was put into your GI tract during the procedure and reduce the bloating. If you had a lower endoscopy (such as a colonoscopy or flexible sigmoidoscopy) you may notice spotting of blood in your stool or on the toilet paper. If you underwent a bowel prep for your procedure, then you may not have a normal bowel movement for a few days.  DIET: Your first meal following the procedure should be a light meal and then it is ok to progress to your normal diet.  A half-sandwich or bowl of soup is an example of a good first meal.  Heavy or fried foods are harder to digest and may make you feel nauseous or bloated.  Likewise meals heavy in dairy and vegetables can cause extra gas to form and this can also increase the bloating.  Drink plenty of fluids but you should avoid alcoholic beverages for 24 hours.  ACTIVITY: Your care partner should take you home directly after the procedure.  You should plan to take it easy, moving slowly for the rest of the day.  You can resume normal activity the day after the procedure however you should NOT DRIVE or use heavy machinery for 24 hours (because of the sedation medicines used during the test).    SYMPTOMS TO REPORT IMMEDIATELY: A  gastroenterologist can be reached at any hour.  During normal business hours, 8:30 AM to 5:00 PM Monday through Friday, call [phone].  After hours and on weekends, please call the GI answering service at [phone] who will take a message and have the physician on call contact you.   Following upper endoscopy (EGD)  Vomiting of blood or coffee ground material  New chest pain or pain under the shoulder blades  Painful or persistently difficult swallowing  New shortness of breath  Fever of 100F or higher  Black, tarry-looking stools  FOLLOW UP: If any biopsies were taken you will be contacted by phone or by letter within the next 1-3 weeks.  Call your gastroenterologist if you have not heard about the biopsies in 3 weeks.  Our staff will call the home number listed on your records the next business day following your procedure to check on you and address any questions or concerns that you may have at that time regarding the information given to you following your procedure. This is a courtesy call and so if there is no answer at the home number and we have not heard from you through the emergency physician on call, we will assume that you have returned to your regular daily activities without incident.  SIGNATURES/CONFIDENTIALITY: You and/or your care partner have signed paperwork which will be entered into your electronic medical record.  These signatures attest to the fact that that the information  above on your After Visit Summary has been reviewed and is understood.  Full responsibility of the confidentiality of this discharge information lies with you and/or your care-partner. 

## 2014-06-01 NOTE — Progress Notes (Signed)
Report to PACU, RN, vss, BBS= Clear.  

## 2014-06-02 ENCOUNTER — Encounter: Payer: Self-pay | Admitting: *Deleted

## 2014-06-02 ENCOUNTER — Other Ambulatory Visit: Payer: Self-pay | Admitting: *Deleted

## 2014-06-02 ENCOUNTER — Telehealth: Payer: Self-pay | Admitting: Internal Medicine

## 2014-06-02 ENCOUNTER — Telehealth: Payer: Self-pay | Admitting: *Deleted

## 2014-06-02 DIAGNOSIS — R1012 Left upper quadrant pain: Secondary | ICD-10-CM

## 2014-06-02 MED ORDER — RIFAXIMIN 550 MG PO TABS: ORAL_TABLET | ORAL | Status: DC

## 2014-06-02 NOTE — Telephone Encounter (Signed)
Advised patient that I have initiated prior authorization. She verbalizes understanding.

## 2014-06-02 NOTE — Telephone Encounter (Signed)
Per procedure report on 06/02/14 scheduled CT chest on 06/03/14 at 4:00 PM. NPO 2 hours prior. F/u OV 08/02/14 2:45 PM with Dr. Hilarie Fredrickson. Patient notified of appointments and instructions.

## 2014-06-03 ENCOUNTER — Telehealth: Payer: Self-pay | Admitting: *Deleted

## 2014-06-03 ENCOUNTER — Ambulatory Visit (INDEPENDENT_AMBULATORY_CARE_PROVIDER_SITE_OTHER)
Admission: RE | Admit: 2014-06-03 | Discharge: 2014-06-03 | Disposition: A | Discharge status: Home / Self Care | Payer: BC Managed Care – PPO | Source: Ambulatory Visit | Attending: Internal Medicine | Admitting: Internal Medicine

## 2014-06-03 DIAGNOSIS — R1012 Left upper quadrant pain: Secondary | ICD-10-CM

## 2014-06-03 MED ORDER — IOHEXOL 300 MG/ML  SOLN
80.0000 mL | Freq: Once | INTRAMUSCULAR | Status: AC | PRN
  Administered 2014-06-03: 80 mL via INTRAVENOUS

## 2014-06-03 NOTE — Telephone Encounter (Signed)
  Follow up Call-  Call back number 06/01/2014  Post procedure Call Back phone  # [phone]  Permission to leave phone message Yes  comments no voicemail     Patient questions:  Patient told to call us if necessary.

## 2014-06-03 NOTE — Telephone Encounter (Signed)
[employer] has authorized xifaxan from 06/03/14-06/17/14. Reference #[id number]. I have left a message advising patient of this.

## 2014-06-06 ENCOUNTER — Telehealth: Payer: Self-pay | Admitting: Internal Medicine

## 2014-06-06 ENCOUNTER — Encounter: Payer: Self-pay | Admitting: Internal Medicine

## 2014-06-06 NOTE — Telephone Encounter (Signed)
See result note.  

## 2014-06-07 ENCOUNTER — Other Ambulatory Visit: Payer: Self-pay

## 2014-06-07 DIAGNOSIS — K449 Diaphragmatic hernia without obstruction or gangrene: Secondary | ICD-10-CM

## 2014-06-08 ENCOUNTER — Ambulatory Visit (INDEPENDENT_AMBULATORY_CARE_PROVIDER_SITE_OTHER): Payer: BC Managed Care – PPO | Admitting: Surgery

## 2014-06-08 ENCOUNTER — Encounter (INDEPENDENT_AMBULATORY_CARE_PROVIDER_SITE_OTHER): Payer: Self-pay | Admitting: Surgery

## 2014-06-08 VITALS — BP 120/80 | HR 96 | Temp 97.5°F | Ht 64.0 in | Wt 160.0 lb

## 2014-06-08 DIAGNOSIS — K2100 Gastro-esophageal reflux disease with esophagitis, without bleeding: Secondary | ICD-10-CM

## 2014-06-08 NOTE — Progress Notes (Signed)
DATE OF PROCEDURE: 01/18/2009  DATE OF DISCHARGE:  OPERATIVE REPORT  PREOPERATIVE INDICATIONS: Gastroesophageal reflux disease with hiatal  hernia.  POSTOPERATIVE DIAGNOSES: A moderate-sized hiatal hernia,  gastroesophageal reflux disease, status post laparoscopic Nissen  fundoplication and 2 posterior pledgeted suture closure of the hiatus.  SURGEON: Isabel Caprice. Hassell Done, MD  ASSISTANT: Stark Klein, MD  Chief Complaint:  Epigastric pain and postprandial distention with heartburn  History of Present Illness:  [name] is an 63 y.o. female who had 5 very good years after her Nissen fundoplication repair of large hiatal hernia. She had a upper respiratory tract infection and a lot of coughing earlier this year in March began having bloating and discomfort and pain particularly after eating. She described as good in the morning but later in the day she grows very comfortable. She's also begun to be able to burp a little bit. I reviewed her upper endoscopy by Dr. Elmo Putt and it looks like her greater curvature his herniated above the wrap with a little bit above exposing the distal esophagus to acid. I think perhaps she would benefit from take down and re\re plication for Nissen fundoplication. I described the operation and is complexity. She was would have this done. We will schedule Elvina Sidle.  Past Medical History  Diagnosis Date  . Hypertension   . Barrett's esophagus   . IBS (irritable bowel syndrome)   . GERD (gastroesophageal reflux disease)   . Hiatal hernia   . Degeneration of intervertebral disc, site unspecified   . Thyroid disease   . Diverticulosis of colon (without mention of hemorrhage)     Past Surgical History  Procedure Laterality Date  . Total vaginal hysterectomy    . Colonoscopy  2009    562.10  . Tmj arthroplasty    . Trigger finger release    . Nissen fundoplication  6767  . Appendectomy      Current Outpatient Prescriptions  Medication Sig Dispense  Refill  . acetaminophen (TYLENOL) 325 MG tablet Take 650 mg by mouth as needed.      . Calcium Carbonate-Vitamin D (CALCIUM 600 + D PO) Take 1 tablet by mouth daily.        . famotidine (PEPCID) 20 MG tablet Take 20 mg by mouth every evening. At night      . HYDROCHLOROTHIAZIDE PO Take 25 tablets by mouth daily.        . hyoscyamine (LEVSIN SL) 0.125 MG SL tablet Place 0.125 mg under the tongue every 6 (six) hours as needed.      Marland Kitchen levothyroxine (SYNTHROID, LEVOTHROID) 88 MCG tablet Take 88 mcg by mouth daily.        . Multiple Vitamin (MULTIVITAMIN) tablet Take 1 tablet by mouth daily.        . Probiotic Product (ALIGN) 4 MG CAPS Take 1 capsule by mouth daily.      . raloxifene (EVISTA) 60 MG tablet Take 60 mg by mouth daily.        . rifaximin (XIFAXAN) 550 MG TABS tablet Take one po TID for 10 days for bloating/bacterial overgrowth  30 tablet  0  . traMADol (ULTRAM) 50 MG tablet Take 50 mg by mouth every 6 (six) hours as needed.        . valACYclovir (VALTREX) 1000 MG tablet        Current Facility-Administered Medications  Medication Dose Route Frequency Provider Last Rate Last Dose  . 0.9 %  sodium chloride infusion  500 mL Intravenous Continuous  Sable Feil, MD       Alendronate sodium; Ciprofloxacin; Codeine; and Tetracycline Family History  Problem Relation Age of Onset  . Cancer Father 35    MOUTH AND THROAT  . Esophageal cancer Father   . Colon cancer Neg Hx    Social History:   reports that she has never smoked. She has never used smokeless tobacco. She reports that she does not drink alcohol or use illicit drugs.   REVIEW OF SYSTEMS : Negative except for chest pain and abdominal distention abdominal pain constipation diarrhea  Physical Exam:   Blood pressure 120/80, pulse 96, temperature 97.5 F (36.4 C), height 5\' 4"  (1.626 m), weight 160 lb (72.576 kg). Body mass index is 27.45 kg/(m^2).  Gen:  WDWN white female NAD  Neurological: Alert and oriented to  person, place, and time. Motor and sensory function is grossly intact  Head: Normocephalic and atraumatic.  Eyes: Conjunctivae are normal. Pupils are equal, round, and reactive to light. No scleral icterus.  Neck: Normal range of motion. Neck supple. No tracheal deviation or thyromegaly present.  Cardiovascular:  SR without murmurs or gallops.  No carotid bruits Breast:  Not examined Respiratory: Effort normal.  No respiratory distress. No chest wall tenderness. Breath sounds normal.  No wheezes, rales or rhonchi.  Abdomen:  nontender at present GU:  Not examined Musculoskeletal: Normal range of motion. Extremities are nontender. No cyanosis, edema or clubbing noted Lymphadenopathy: No cervical, preauricular, postauricular or axillary adenopathy is present Skin: Skin is warm and dry. No rash noted. No diaphoresis. No erythema. No pallor. Pscyh: Normal mood and affect. Behavior is normal. Judgment and thought content normal.   LABORATORY RESULTS: No results found for this or any previous visit (from the past 48 hour(s)).   RADIOLOGY RESULTS: No results found.  Problem List: Patient Active Problem List   Diagnosis Date Noted  . Abdominal pain, epigastric 05/20/2014  . Diarrhea 03/29/2013  . Nonspecific (abnormal) findings on radiological and other examination of gastrointestinal tract 03/29/2013  . GERD (gastroesophageal reflux disease) 05/14/2011  . Bloating 05/14/2011  . Gas 05/14/2011  . Barrett esophagus 05/14/2011  . S/P laparoscopic fundoplication 16/07/9603  . Duodenitis 04/12/2011  . Abdominal gas pain 04/12/2011  . HYPOTHYROIDISM 07/17/2010  . BARRETT'S ESOPHAGUS 11/10/2008  . GASTRITIS 10/05/2008  . LACTOSE INTOLERANCE 10/04/2008  . HYPERTENSION 10/04/2008  . GERD 10/04/2008  . HIATAL HERNIA 10/04/2008  . OSTEOPOROSIS 10/04/2008  . FLATULENCE ERUCTATION AND GAS PAIN 10/04/2008  . DIVERTICULOSIS, COLON 10/03/2008    Assessment & Plan: Evidence of anterior prolapse  and above the Nissen wrap. She is symptomatic and will need redo Nissen. We'll schedule the [employer].    Matt B. Hassell Done, MD, [facility] [phone] beeper [phone]  06/08/2014 3:43 PM

## 2014-06-08 NOTE — Patient Instructions (Signed)
Nissen Fundoplication °Care After °Please read the instructions outlined below and refer to this sheet for the next few weeks. These discharge instructions provide you with general information on caring for yourself after you leave the hospital. Your doctor may also give you specific instructions. While your treatment has been planned according to the most current medical practices available, unavoidable complications sometimes happen. If you have any problems or questions after discharge, please call your doctor. °ACTIVITY °· Take frequent rest periods throughout the day. °· Take frequent walks throughout the day. This will help to prevent blood clots. °· Continue to do your coughing and deep breathing exercises once you get home. This will help to prevent pneumonia. °· No strenuous activities such as heavy lifting, pushing or pulling until after your follow-up visit with your doctor. Do not lift anything heavier than 10 pounds. °· Talk with your caregiver about when you may return to work and your exercise routine. °· You may shower 2 days after surgery. Pat incisions dry. Do not rub incisions with washcloth or towel. °· Do not drive while taking prescription pain medication. °NUTRITION °· Continue with a liquid diet, or the diet you were directed to take, until your first follow-up visit with your surgeon. °· Drink fluids (6-8 glasses a day). °· Call your caregiver for persistent nausea (feeling sick to your stomach), vomiting, bloating or difficulty swallowing. °ELIMINATION °It is very important not to strain during bowel movements. If constipation should occur, you may: °· Take a mild laxative (such as Milk of Magnesia®). °· Add fruit and bran to your diet. °· Drink more fluids. °· Call your caregiver if constipation is not relieved. °FEVER °If you feel feverish or have shaking chills, take your temperature. If it is 102° F (38.9° C) or above, call your caregiver. The fever may mean there is an infection. °PAIN  CONTROL °· If a prescription was given for a pain reliever, please follow your caregiver's directions. °· Only take over-the-counter or prescription medicines for pain, discomfort, or fever as directed by your caregiver. °· If the pain is not relieved by your medicine, becomes worse, or you have difficulty breathing, call your doctor. °INCISION °· It is normal for your cuts (incisions) from surgery to have a small amount of drainage for the first 1-2 days. Once the drainage has stopped, leave your incision(s) open to air. °· Check your incision(s) and surrounding area daily for any redness, swelling, increased drainage or bleeding. If any of these are present or if the wound edges start to separate, call your doctor. °· If you have small adhesive strips in place, they will peel and fall off. (If these strips are covered with a clear bandage, your doctor will tell you when to remove them.) °· If you have staples, your caregiver will remove them at the follow-up appointment. °Document Released: 05/30/2004 Document Revised: 12/30/2011 Document Reviewed: 09/03/2007 °ExitCare® Patient Information ©2015 [employer]. This information is not intended to replace advice given to you by your health care provider. Make sure you discuss any questions you have with your health care provider. ° °

## 2014-06-15 ENCOUNTER — Ambulatory Visit (INDEPENDENT_AMBULATORY_CARE_PROVIDER_SITE_OTHER): Payer: BC Managed Care – PPO | Admitting: Cardiology

## 2014-06-15 ENCOUNTER — Encounter: Payer: Self-pay | Admitting: Cardiology

## 2014-06-15 VITALS — BP 119/82 | HR 73 | Ht 64.0 in | Wt 161.1 lb

## 2014-06-15 DIAGNOSIS — R079 Chest pain, unspecified: Secondary | ICD-10-CM

## 2014-06-15 DIAGNOSIS — I447 Left bundle-branch block, unspecified: Secondary | ICD-10-CM

## 2014-06-15 NOTE — Progress Notes (Signed)
Clinical Summary [name] is a 63 y.o.female seen today as a new patient for the following medical problems.  1. CAD - recent CT scan showed coronary calcifications   - previously by [facility] in [place], reports nuclear stress test approx in 2012. She reports history of LBBB at that time. Sounds like she had a Lexiscan MPI at time, she reports test was normal. Results are not available at this time.  - describes epigastric pain, burning like pain. With + belching, lasts several hours at time. No other associated symptoms. Feels bloated. Ongoing symptoms x 6-7 weeks. Worst with eating.   - also has rare episodes of chest pain, sharp left chest. Stinging like pain, 3/10. Lasts just a few seconds. No other associated symptoms. Ongoing x 1 year. Can occur at rest or with exertion. Denies any DOE, orthopnea, or PND.    2. GERD - complex history, prior Nissen procedure and large hiatal hernia. Being considered for repeat Nissen.    Past Medical History  Diagnosis Date  . Hypertension   . Barrett's esophagus   . IBS (irritable bowel syndrome)   . GERD (gastroesophageal reflux disease)   . Hiatal hernia   . Degeneration of intervertebral disc, site unspecified   . Thyroid disease   . Diverticulosis of colon (without mention of hemorrhage)      Allergies  Allergen Reactions  . Alendronate Sodium   . Ciprofloxacin     REACTION: rash, hives  . Codeine     REACTION: NAUSEA  . Tetracycline     REACTION: rash, hives     Current Outpatient Prescriptions  Medication Sig Dispense Refill  . acetaminophen (TYLENOL) 325 MG tablet Take 650 mg by mouth as needed.      . Calcium Carbonate-Vitamin D (CALCIUM 600 + D PO) Take 1 tablet by mouth daily.        . famotidine (PEPCID) 20 MG tablet Take 20 mg by mouth every evening. At night      . HYDROCHLOROTHIAZIDE PO Take 25 tablets by mouth daily.        . hyoscyamine (LEVSIN SL) 0.125 MG SL tablet Place 0.125 mg under the tongue  every 6 (six) hours as needed.      Marland Kitchen levothyroxine (SYNTHROID, LEVOTHROID) 88 MCG tablet Take 88 mcg by mouth daily.        . Multiple Vitamin (MULTIVITAMIN) tablet Take 1 tablet by mouth daily.        . Probiotic Product (ALIGN) 4 MG CAPS Take 1 capsule by mouth daily.      . raloxifene (EVISTA) 60 MG tablet Take 60 mg by mouth daily.        . rifaximin (XIFAXAN) 550 MG TABS tablet Take one po TID for 10 days for bloating/bacterial overgrowth  30 tablet  0  . traMADol (ULTRAM) 50 MG tablet Take 50 mg by mouth every 6 (six) hours as needed.        . valACYclovir (VALTREX) 1000 MG tablet        Current Facility-Administered Medications  Medication Dose Route Frequency Provider Last Rate Last Dose  . 0.9 %  sodium chloride infusion  500 mL Intravenous Continuous Sable Feil, MD         Past Surgical History  Procedure Laterality Date  . Total vaginal hysterectomy    . Colonoscopy  2009    562.10  . Tmj arthroplasty    . Trigger finger release    . Nissen fundoplication  2010  . Appendectomy       Allergies  Allergen Reactions  . Alendronate Sodium   . Ciprofloxacin     REACTION: rash, hives  . Codeine     REACTION: NAUSEA  . Tetracycline     REACTION: rash, hives      Family History  Problem Relation Age of Onset  . Cancer Father 68    MOUTH AND THROAT  . Esophageal cancer Father   . Colon cancer Neg Hx      Social History Ms. Slatten reports that she has never smoked. She has never used smokeless tobacco. Ms. Reimers reports that she does not drink alcohol.   Review of Systems CONSTITUTIONAL: No weight loss, fever, chills, weakness or fatigue.  HEENT: Eyes: No visual loss, blurred vision, double vision or yellow sclerae.No hearing loss, sneezing, congestion, runny nose or sore throat.  SKIN: No rash or itching.  CARDIOVASCULAR: per HPI RESPIRATORY: No shortness of breath, cough or sputum.  GASTROINTESTINAL: per HPI  GENITOURINARY: No burning on  urination, no polyuria NEUROLOGICAL: No headache, dizziness, syncope, paralysis, ataxia, numbness or tingling in the extremities. No change in bowel or bladder control.  MUSCULOSKELETAL: No muscle, back pain, joint pain or stiffness.  LYMPHATICS: No enlarged nodes. No history of splenectomy.  PSYCHIATRIC: No history of depression or anxiety.  ENDOCRINOLOGIC: No reports of sweating, cold or heat intolerance. No polyuria or polydipsia.  Marland Kitchen   Physical Examination p 73 bp 119/82 Wt 161 lbs BMI 28 Gen: resting comfortably, no acute distress HEENT: no scleral icterus, pupils equal round and reactive, no palptable cervical adenopathy,  CV: RRR, no m/r/g, no JVD, no carotid bruits Resp: Clear to auscultation bilaterally GI: abdomen is soft, non-tender, non-distended, normal bowel sounds, no hepatosplenomegaly MSK: extremities are warm, no edema.  Skin: warm, no rash Neuro:  no focal deficits Psych: appropriate affect   Diagnostic Studies EKG sinus rhythm, LBBB    Assessment and Plan  1. CAD - patient with noted coronary calcifications on CT scan, from history known chronic LBBB worked up by [employer] with a negative nuclear stress per her report. We are requesting those records - denies any specific cardiac like pain, she has clear epigastric symptoms exacerbated by food consistent with her chronic GI issues. Denies any significant DOE - at this time, will follow up records. Do not see indication for repeat stress testing  2. Preoperative evaluation - she is being considered for a moderate risk GI procedure. She has not active acute cardiac conditions. She reports she can walk up a flight of stairs without limitation, consistent with exercise capacity >4METs. Recommend proceeding with planned surgery without any further cardiac testing or intervention.   F/u 1 year   Arnoldo Lenis, M.D., F.A.C.C.

## 2014-06-15 NOTE — Patient Instructions (Signed)
Continue all current medications. Your physician wants you to follow up in:  1 year.  You will receive a reminder letter in the mail one-two months in advance.  If you don't receive a letter, please call our office to schedule the follow up appointment   

## 2014-07-01 ENCOUNTER — Ambulatory Visit: Payer: BC Managed Care – PPO | Admitting: Internal Medicine

## 2014-07-15 ENCOUNTER — Encounter (HOSPITAL_COMMUNITY): Payer: Self-pay | Admitting: Pharmacy Technician

## 2014-07-20 NOTE — Patient Instructions (Addendum)
[name]  07/20/2014   Your procedure is scheduled on:  07/29/2014    Report to [facility].  Follow the Signs to [place] at    [place]    am  Call this number if you have problems the morning of surgery: [phone]   Remember:Fleets enema nite before surgery.     Do not eat food or drink liquids after midnight.   Take these medicines the morning of surgery with A SIP OF WATER: Pepcid, Levothyroxine    Do not wear jewelry, make-up or nail polish.  Do not wear lotions, powders, or perfumes deodorant.  Do not shave 48 hours prior to surgery.   Do not bring valuables to the hospital.  Contacts, dentures or bridgework may not be worn into surgery.  Leave suitcase in the car. After surgery it may be brought to your room.  For patients admitted to the hospital, checkout time is 11:00 AM the day of  discharge.         Please read over the following fact sheets that you were given: [facility] - Preparing for Surgery Before surgery, you can play an important role.  Because skin is not sterile, your skin needs to be as free of germs as possible.  You can reduce the number of germs on your skin by washing with CHG (chlorahexidine gluconate) soap before surgery.  CHG is an antiseptic cleaner which kills germs and bonds with the skin to continue killing germs even after washing. Please DO NOT use if you have an allergy to CHG or antibacterial soaps.  If your skin becomes reddened/irritated stop using the CHG and inform your nurse when you arrive at Short Stay. Do not shave (including legs and underarms) for at least 48 hours prior to the first CHG shower.  You may shave your face/neck. Please follow these instructions carefully:  1.  Shower with CHG Soap the night before surgery and the  morning of Surgery.  2.  If you choose to wash your hair, wash your hair first as usual with your  normal  shampoo.  3.  After you shampoo, rinse your hair and body thoroughly to remove the   shampoo.                           4.  Use CHG as you would any other liquid soap.  You can apply chg directly  to the skin and wash                       Gently with a scrungie or clean washcloth.  5.  Apply the CHG Soap to your body ONLY FROM THE NECK DOWN.   Do not use on face/ open                           Wound or open sores. Avoid contact with eyes, ears mouth and genitals (private parts).                       Wash face,  Genitals (private parts) with your normal soap.             6.  Wash thoroughly, paying special attention to the area where your surgery  will be performed.  7.  Thoroughly rinse your body with warm water from the neck down.  8.  DO NOT shower/wash with your normal soap after using and rinsing off  the CHG Soap.                9.  Pat yourself dry with a clean towel.            10.  Wear clean pajamas.            11.  Place clean sheets on your bed the night of your first shower and do not  sleep with pets. Day of Surgery : Do not apply any lotions/deodorants the morning of surgery.  Please wear clean clothes to the hospital/surgery center.  FAILURE TO FOLLOW THESE INSTRUCTIONS MAY RESULT IN THE CANCELLATION OF YOUR SURGERY PATIENT SIGNATURE_________________________________  NURSE SIGNATURE__________________________________  ________________________________________________________________________  coughing and deep breathing exercises, leg exercises

## 2014-07-21 ENCOUNTER — Encounter (HOSPITAL_COMMUNITY)
Admission: RE | Admit: 2014-07-21 | Discharge: 2014-07-21 | Disposition: A | Discharge status: Home / Self Care | Payer: BC Managed Care – PPO | Source: Ambulatory Visit | Attending: Surgery | Admitting: Surgery

## 2014-07-21 ENCOUNTER — Encounter (HOSPITAL_COMMUNITY): Payer: Self-pay

## 2014-07-21 ENCOUNTER — Ambulatory Visit (HOSPITAL_COMMUNITY)
Admission: RE | Admit: 2014-07-21 | Discharge: 2014-07-21 | Disposition: A | Discharge status: Home / Self Care | Payer: BC Managed Care – PPO | Source: Ambulatory Visit | Attending: Surgery | Admitting: Surgery

## 2014-07-21 DIAGNOSIS — K2270 Barrett's esophagus without dysplasia: Secondary | ICD-10-CM | POA: Diagnosis not present

## 2014-07-21 DIAGNOSIS — K219 Gastro-esophageal reflux disease without esophagitis: Secondary | ICD-10-CM | POA: Diagnosis not present

## 2014-07-21 DIAGNOSIS — Z01818 Encounter for other preprocedural examination: Secondary | ICD-10-CM | POA: Diagnosis not present

## 2014-07-21 HISTORY — DX: Hypothyroidism, unspecified: E03.9

## 2014-07-21 LAB — BASIC METABOLIC PANEL
Anion gap: 12 (ref 5–15)
BUN: 11 mg/dL (ref 6–23)
CO2: 28 mEq/L (ref 19–32)
Calcium: 9.3 mg/dL (ref 8.4–10.5)
Chloride: 103 mEq/L (ref 96–112)
Creatinine, Ser: 0.66 mg/dL (ref 0.50–1.10)
GFR calc Af Amer: >90 mL/min (ref >90)
GFR calc non Af Amer: >90 mL/min (ref >90)
Glucose, Bld: 91 mg/dL (ref 70–99)
Potassium: 4.3 mEq/L (ref 3.7–5.3)
Sodium: 143 mEq/L (ref 137–147)

## 2014-07-21 LAB — CBC
HCT: 41.8 % (ref 36.0–46.0)
Hemoglobin: 14.1 g/dL (ref 12.0–15.0)
MCH: 31.8 pg (ref 26.0–34.0)
MCHC: 33.7 g/dL (ref 30.0–36.0)
MCV: 94.4 fL (ref 78.0–100.0)
Platelets: 326 10*3/uL (ref 150–400)
RBC: 4.43 MIL/uL (ref 3.87–5.11)
RDW: 13 % (ref 11.5–15.5)
WBC: 4.6 10*3/uL (ref 4.0–10.5)

## 2014-07-21 NOTE — Progress Notes (Addendum)
EKG 06/15/14 EP[facility]  Dr Harl Bowie- LOV- 06/08/14 EP[facility]- clearance for surgery  CXR 07/21/14 EP[facility]

## 2014-07-21 NOTE — Progress Notes (Signed)
Patient with recent cough.  Placed on antibiotics per PCP.  To finish on 07/21/2014.  With cough at time of preop appointment will obtain CXR .

## 2014-07-28 NOTE — H&P (Signed)
DATE OF PROCEDURE: 01/18/2009  DATE OF DISCHARGE:  OPERATIVE REPORT  PREOPERATIVE INDICATIONS: Gastroesophageal reflux disease with hiatal  hernia.  POSTOPERATIVE DIAGNOSES: A moderate-sized hiatal hernia,  gastroesophageal reflux disease, status post laparoscopic Nissen  fundoplication and 2 posterior pledgeted suture closure of the hiatus.  SURGEON: Isabel Caprice. Hassell Done, MD  ASSISTANT: Stark Klein, MD  Chief Complaint: Epigastric pain and postprandial distention with heartburn  History of Present Illness: [name] is an 63 y.o. female who had 5 very good years after her Nissen fundoplication repair of large hiatal hernia. She had a upper respiratory tract infection and a lot of coughing earlier this year in March began having bloating and discomfort and pain particularly after eating. She described as good in the morning but later in the day she grows very comfortable. She's also begun to be able to burp a little bit. I reviewed her upper endoscopy by Dr. Elmo Putt and it looks like her greater curvature his herniated above the wrap with a little bit above exposing the distal esophagus to acid. I think perhaps she would benefit from take down and re\re plication for Nissen fundoplication. I described the operation and is complexity. She was would have this done. We will schedule Elvina Sidle.  Past Medical History   Diagnosis  Date   .  Hypertension    .  Barrett's esophagus    .  IBS (irritable bowel syndrome)    .  GERD (gastroesophageal reflux disease)    .  Hiatal hernia    .  Degeneration of intervertebral disc, site unspecified    .  Thyroid disease    .  Diverticulosis of colon (without mention of hemorrhage)     Past Surgical History   Procedure  Laterality  Date   .  Total vaginal hysterectomy     .  Colonoscopy   2009     562.10   .  Tmj arthroplasty     .  Trigger finger release     .  Nissen fundoplication   1700   .  Appendectomy      Current Outpatient Prescriptions    Medication  Sig  Dispense  Refill   .  acetaminophen (TYLENOL) 325 MG tablet  Take 650 mg by mouth as needed.     .  Calcium Carbonate-Vitamin D (CALCIUM 600 + D PO)  Take 1 tablet by mouth daily.     .  famotidine (PEPCID) 20 MG tablet  Take 20 mg by mouth every evening. At night     .  HYDROCHLOROTHIAZIDE PO  Take 25 tablets by mouth daily.     .  hyoscyamine (LEVSIN SL) 0.125 MG SL tablet  Place 0.125 mg under the tongue every 6 (six) hours as needed.     Marland Kitchen  levothyroxine (SYNTHROID, LEVOTHROID) 88 MCG tablet  Take 88 mcg by mouth daily.     .  Multiple Vitamin (MULTIVITAMIN) tablet  Take 1 tablet by mouth daily.     .  Probiotic Product (ALIGN) 4 MG CAPS  Take 1 capsule by mouth daily.     .  raloxifene (EVISTA) 60 MG tablet  Take 60 mg by mouth daily.     .  rifaximin (XIFAXAN) 550 MG TABS tablet  Take one po TID for 10 days for bloating/bacterial overgrowth  30 tablet  0   .  traMADol (ULTRAM) 50 MG tablet  Take 50 mg by mouth every 6 (six) hours as needed.     Marland Kitchen  valACYclovir (VALTREX) 1000 MG tablet       Current Facility-Administered Medications   Medication  Dose  Route  Frequency  Provider  Last Rate  Last Dose   .  0.9 % sodium chloride infusion  500 mL  Intravenous  Continuous  Sable Feil, MD     Alendronate sodium; Ciprofloxacin; Codeine; and Tetracycline  Family History   Problem  Relation  Age of Onset   .  Cancer  Father  15     MOUTH AND THROAT   .  Esophageal cancer  Father    .  Colon cancer  Neg Hx    Social History: reports that she has never smoked. She has never used smokeless tobacco. She reports that she does not drink alcohol or use illicit drugs.  REVIEW OF SYSTEMS :  Negative except for chest pain and abdominal distention abdominal pain constipation diarrhea  Physical Exam:  Blood pressure 120/80, pulse 96, temperature 97.5 F (36.4 C), height 5\' 4"  (1.626 m), weight 160 lb (72.576 kg).  Body mass index is 27.45 kg/(m^2).  Gen: WDWN white female  NAD  Neurological: Alert and oriented to person, place, and time. Motor and sensory function is grossly intact  Head: Normocephalic and atraumatic.  Eyes: Conjunctivae are normal. Pupils are equal, round, and reactive to light. No scleral icterus.  Neck: Normal range of motion. Neck supple. No tracheal deviation or thyromegaly present.  Cardiovascular: SR without murmurs or gallops. No carotid bruits  Breast: Not examined  Respiratory: Effort normal. No respiratory distress. No chest wall tenderness. Breath sounds normal. No wheezes, rales or rhonchi.  Abdomen: nontender at present  GU: Not examined  Musculoskeletal: Normal range of motion. Extremities are nontender. No cyanosis, edema or clubbing noted Lymphadenopathy: No cervical, preauricular, postauricular or axillary adenopathy is present Skin: Skin is warm and dry. No rash noted. No diaphoresis. No erythema. No pallor. Pscyh: Normal mood and affect. Behavior is normal. Judgment and thought content normal.  LABORATORY RESULTS:  No results found for this or any previous visit (from the past 48 hour(s)).  RADIOLOGY RESULTS:  No results found.  Problem List:  Patient Active Problem List    Diagnosis  Date Noted   .  Abdominal pain, epigastric  05/20/2014   .  Diarrhea  03/29/2013   .  Nonspecific (abnormal) findings on radiological and other examination of gastrointestinal tract  03/29/2013   .  GERD (gastroesophageal reflux disease)  05/14/2011   .  Bloating  05/14/2011   .  Gas  05/14/2011   .  Barrett esophagus  05/14/2011   .  S/P laparoscopic fundoplication  34/19/6222   .  Duodenitis  04/12/2011   .  Abdominal gas pain  04/12/2011   .  HYPOTHYROIDISM  07/17/2010   .  BARRETT'S ESOPHAGUS  11/10/2008   .  GASTRITIS  10/05/2008   .  LACTOSE INTOLERANCE  10/04/2008   .  HYPERTENSION  10/04/2008   .  GERD  10/04/2008   .  HIATAL HERNIA  10/04/2008   .  OSTEOPOROSIS  10/04/2008   .  FLATULENCE ERUCTATION AND GAS PAIN   10/04/2008   .  DIVERTICULOSIS, COLON  10/03/2008   Assessment & Plan:  Evidence of anterior prolapse and above the Nissen wrap. She is symptomatic and will need redo Nissen. We'll schedule the [employer].  Matt B. Hassell Done, MD, [facility]  [phone] beeper  [phone]

## 2014-07-29 ENCOUNTER — Encounter (HOSPITAL_COMMUNITY): Payer: BC Managed Care – PPO | Admitting: Anesthesiology

## 2014-07-29 ENCOUNTER — Ambulatory Visit (HOSPITAL_COMMUNITY): Payer: BC Managed Care – PPO | Admitting: Anesthesiology

## 2014-07-29 ENCOUNTER — Encounter (HOSPITAL_COMMUNITY)
Admission: RE | Disposition: A | Discharge status: Home / Self Care | Payer: Self-pay | Source: Ambulatory Visit | Attending: Surgery

## 2014-07-29 ENCOUNTER — Inpatient Hospital Stay (HOSPITAL_COMMUNITY)
Admission: RE | Admit: 2014-07-29 | Discharge: 2014-07-31 | DRG: 328 | Disposition: A | Discharge status: Home / Self Care | Payer: BC Managed Care – PPO | Source: Ambulatory Visit | Attending: Surgery | Admitting: Surgery

## 2014-07-29 ENCOUNTER — Encounter (HOSPITAL_COMMUNITY): Payer: Self-pay

## 2014-07-29 DIAGNOSIS — E739 Lactose intolerance, unspecified: Secondary | ICD-10-CM | POA: Diagnosis present

## 2014-07-29 DIAGNOSIS — I10 Essential (primary) hypertension: Secondary | ICD-10-CM | POA: Diagnosis present

## 2014-07-29 DIAGNOSIS — K589 Irritable bowel syndrome without diarrhea: Secondary | ICD-10-CM | POA: Diagnosis present

## 2014-07-29 DIAGNOSIS — Z85828 Personal history of other malignant neoplasm of skin: Secondary | ICD-10-CM | POA: Diagnosis not present

## 2014-07-29 DIAGNOSIS — K2270 Barrett's esophagus without dysplasia: Secondary | ICD-10-CM | POA: Diagnosis present

## 2014-07-29 DIAGNOSIS — M810 Age-related osteoporosis without current pathological fracture: Secondary | ICD-10-CM | POA: Diagnosis present

## 2014-07-29 DIAGNOSIS — K219 Gastro-esophageal reflux disease without esophagitis: Principal | ICD-10-CM | POA: Diagnosis present

## 2014-07-29 DIAGNOSIS — Z800 Family history of malignant neoplasm of digestive organs: Secondary | ICD-10-CM | POA: Diagnosis not present

## 2014-07-29 DIAGNOSIS — E039 Hypothyroidism, unspecified: Secondary | ICD-10-CM | POA: Diagnosis present

## 2014-07-29 DIAGNOSIS — K449 Diaphragmatic hernia without obstruction or gangrene: Secondary | ICD-10-CM | POA: Diagnosis present

## 2014-07-29 DIAGNOSIS — Z79899 Other long term (current) drug therapy: Secondary | ICD-10-CM

## 2014-07-29 DIAGNOSIS — Z9889 Other specified postprocedural states: Secondary | ICD-10-CM

## 2014-07-29 HISTORY — PX: LAPAROSCOPIC NISSEN FUNDOPLICATION: SHX1932

## 2014-07-29 LAB — CBC
HCT: 36.1 % (ref 36.0–46.0)
Hemoglobin: 12.3 g/dL (ref 12.0–15.0)
MCH: 32 pg (ref 26.0–34.0)
MCHC: 34.1 g/dL (ref 30.0–36.0)
MCV: 94 fL (ref 78.0–100.0)
Platelets: 271 10*3/uL (ref 150–400)
RBC: 3.84 MIL/uL — ABNORMAL LOW (ref 3.87–5.11)
RDW: 13 % (ref 11.5–15.5)
WBC: 11.8 10*3/uL — ABNORMAL HIGH (ref 4.0–10.5)

## 2014-07-29 LAB — CREATININE, SERUM
Creatinine, Ser: 0.67 mg/dL (ref 0.50–1.10)
GFR calc Af Amer: >90 mL/min (ref >90)
GFR calc non Af Amer: >90 mL/min (ref >90)

## 2014-07-29 SURGERY — FUNDOPLICATION, NISSEN, LAPAROSCOPIC
Anesthesia: General | Site: Abdomen

## 2014-07-29 MED ORDER — DEXTROSE 5 % IV SOLN
2.0000 g | INTRAVENOUS | Status: AC
  Administered 2014-07-29: 2 g via INTRAVENOUS

## 2014-07-29 MED ORDER — SUFENTANIL CITRATE 50 MCG/ML IV SOLN
INTRAVENOUS | Status: AC
  Filled 2014-07-29: qty 1

## 2014-07-29 MED ORDER — GLYCOPYRROLATE 0.2 MG/ML IJ SOLN
INTRAMUSCULAR | Status: AC
  Filled 2014-07-29: qty 3

## 2014-07-29 MED ORDER — CISATRACURIUM BESYLATE 20 MG/10ML IV SOLN
INTRAVENOUS | Status: AC
  Filled 2014-07-29: qty 10

## 2014-07-29 MED ORDER — MIDAZOLAM HCL 5 MG/5ML IJ SOLN
INTRAMUSCULAR | Status: DC | PRN
  Administered 2014-07-29: 2 mg via INTRAVENOUS

## 2014-07-29 MED ORDER — CHLORHEXIDINE GLUCONATE 4 % EX LIQD: 1.0000 "application " | Freq: Once | CUTANEOUS | Status: DC

## 2014-07-29 MED ORDER — BUPIVACAINE LIPOSOME 1.3 % IJ SUSP
20.0000 mL | Freq: Once | INTRAMUSCULAR | Status: AC
  Administered 2014-07-29: 20 mL
  Filled 2014-07-29: qty 20

## 2014-07-29 MED ORDER — 0.9 % SODIUM CHLORIDE (POUR BTL) OPTIME
TOPICAL | Status: DC | PRN
  Administered 2014-07-29: 1000 mL

## 2014-07-29 MED ORDER — FENTANYL CITRATE 0.05 MG/ML IJ SOLN
INTRAMUSCULAR | Status: AC
  Filled 2014-07-29: qty 2

## 2014-07-29 MED ORDER — MIDAZOLAM HCL 2 MG/2ML IJ SOLN
INTRAMUSCULAR | Status: AC
  Filled 2014-07-29: qty 2

## 2014-07-29 MED ORDER — HEPARIN SODIUM (PORCINE) 5000 UNIT/ML IJ SOLN
5000.0000 [IU] | Freq: Once | INTRAMUSCULAR | Status: AC
  Administered 2014-07-29: 5000 [IU] via SUBCUTANEOUS
  Filled 2014-07-29: qty 1

## 2014-07-29 MED ORDER — EPHEDRINE SULFATE 50 MG/ML IJ SOLN
INTRAMUSCULAR | Status: DC | PRN
  Administered 2014-07-29: 7.5 mg via INTRAVENOUS
  Administered 2014-07-29: 2.5 mg via INTRAVENOUS

## 2014-07-29 MED ORDER — LACTATED RINGERS IV SOLN
INTRAVENOUS | Status: DC | PRN
  Administered 2014-07-29 (×3): via INTRAVENOUS

## 2014-07-29 MED ORDER — BUPIVACAINE-EPINEPHRINE (PF) 0.25% -1:200000 IJ SOLN
INTRAMUSCULAR | Status: AC
  Filled 2014-07-29: qty 30

## 2014-07-29 MED ORDER — LABETALOL HCL 5 MG/ML IV SOLN
INTRAVENOUS | Status: DC | PRN
  Administered 2014-07-29: 5 mg via INTRAVENOUS

## 2014-07-29 MED ORDER — FENTANYL CITRATE 0.05 MG/ML IJ SOLN
25.0000 ug | INTRAMUSCULAR | Status: DC | PRN
  Administered 2014-07-29 (×3): 50 ug via INTRAVENOUS

## 2014-07-29 MED ORDER — CISATRACURIUM BESYLATE (PF) 10 MG/5ML IV SOLN
INTRAVENOUS | Status: DC | PRN
  Administered 2014-07-29: 6 mg via INTRAVENOUS
  Administered 2014-07-29: 2 mg via INTRAVENOUS
  Administered 2014-07-29: 10 mg via INTRAVENOUS
  Administered 2014-07-29: 4 mg via INTRAVENOUS

## 2014-07-29 MED ORDER — LABETALOL HCL 5 MG/ML IV SOLN
INTRAVENOUS | Status: AC
  Filled 2014-07-29: qty 4

## 2014-07-29 MED ORDER — ACETAMINOPHEN 10 MG/ML IV SOLN
1000.0000 mg | Freq: Once | INTRAVENOUS | Status: AC
  Administered 2014-07-29: 1000 mg via INTRAVENOUS
  Filled 2014-07-29: qty 100

## 2014-07-29 MED ORDER — DEXTROSE 5 % IV SOLN
1.0000 g | Freq: Four times a day (QID) | INTRAVENOUS | Status: AC
  Administered 2014-07-29: 1 g via INTRAVENOUS
  Filled 2014-07-29 (×2): qty 1

## 2014-07-29 MED ORDER — HYDROMORPHONE HCL 1 MG/ML IJ SOLN
0.2500 mg | INTRAMUSCULAR | Status: DC | PRN
  Administered 2014-07-29 (×2): 0.5 mg via INTRAVENOUS

## 2014-07-29 MED ORDER — LIDOCAINE HCL (CARDIAC) 20 MG/ML IV SOLN
INTRAVENOUS | Status: AC
  Filled 2014-07-29: qty 5

## 2014-07-29 MED ORDER — LACTATED RINGERS IV SOLN: INTRAVENOUS | Status: DC

## 2014-07-29 MED ORDER — ONDANSETRON HCL 4 MG/2ML IJ SOLN
INTRAMUSCULAR | Status: DC | PRN
  Administered 2014-07-29: 4 mg via INTRAVENOUS

## 2014-07-29 MED ORDER — ONDANSETRON HCL 4 MG PO TABS: 4.0000 mg | ORAL_TABLET | Freq: Four times a day (QID) | ORAL | Status: DC | PRN

## 2014-07-29 MED ORDER — PROPOFOL 10 MG/ML IV BOLUS
INTRAVENOUS | Status: DC | PRN
  Administered 2014-07-29: 150 mg via INTRAVENOUS

## 2014-07-29 MED ORDER — DEXTROSE 5 % IV SOLN
2.0000 g | Freq: Once | INTRAVENOUS | Status: AC
  Administered 2014-07-29: 2 g via INTRAVENOUS

## 2014-07-29 MED ORDER — NEOSTIGMINE METHYLSULFATE 10 MG/10ML IV SOLN
INTRAVENOUS | Status: AC
  Filled 2014-07-29: qty 1

## 2014-07-29 MED ORDER — SODIUM CHLORIDE 0.9 % IJ SOLN
INTRAMUSCULAR | Status: AC
  Filled 2014-07-29: qty 20

## 2014-07-29 MED ORDER — KCL IN DEXTROSE-NACL 20-5-0.45 MEQ/L-%-% IV SOLN
INTRAVENOUS | Status: DC
Start: 2014-07-29 — End: 2014-07-30
  Administered 2014-07-29 – 2014-07-30 (×2): via INTRAVENOUS
  Administered 2014-07-30: 100 mL/h via INTRAVENOUS
  Filled 2014-07-29 (×4): qty 1000

## 2014-07-29 MED ORDER — SUFENTANIL CITRATE 50 MCG/ML IV SOLN
INTRAVENOUS | Status: DC | PRN
  Administered 2014-07-29 (×7): 10 ug via INTRAVENOUS
  Administered 2014-07-29: 20 ug via INTRAVENOUS
  Administered 2014-07-29: 10 ug via INTRAVENOUS

## 2014-07-29 MED ORDER — HEPARIN SODIUM (PORCINE) 5000 UNIT/ML IJ SOLN
5000.0000 [IU] | Freq: Three times a day (TID) | INTRAMUSCULAR | Status: DC
  Administered 2014-07-29 – 2014-07-31 (×5): 5000 [IU] via SUBCUTANEOUS
  Filled 2014-07-29 (×8): qty 1

## 2014-07-29 MED ORDER — FLEET ENEMA 7-19 GM/118ML RE ENEM: 1.0000 | ENEMA | Freq: Once | RECTAL | Status: DC

## 2014-07-29 MED ORDER — HYDROMORPHONE HCL 1 MG/ML IJ SOLN
INTRAMUSCULAR | Status: DC | PRN
  Administered 2014-07-29: .6 mg via INTRAVENOUS
  Administered 2014-07-29: .4 mg via INTRAVENOUS
  Administered 2014-07-29: .6 mg via INTRAVENOUS
  Administered 2014-07-29: .4 mg via INTRAVENOUS

## 2014-07-29 MED ORDER — ONDANSETRON HCL 4 MG/2ML IJ SOLN
INTRAMUSCULAR | Status: AC
  Filled 2014-07-29: qty 4

## 2014-07-29 MED ORDER — GLYCOPYRROLATE 0.2 MG/ML IJ SOLN
INTRAMUSCULAR | Status: DC | PRN
  Administered 2014-07-29: .6 mg via INTRAVENOUS

## 2014-07-29 MED ORDER — DEXTROSE 5 % IV SOLN
INTRAVENOUS | Status: AC
  Filled 2014-07-29 (×2): qty 2

## 2014-07-29 MED ORDER — HYDROMORPHONE HCL 1 MG/ML IJ SOLN
INTRAMUSCULAR | Status: AC
  Filled 2014-07-29: qty 1

## 2014-07-29 MED ORDER — SUCCINYLCHOLINE CHLORIDE 20 MG/ML IJ SOLN
INTRAMUSCULAR | Status: DC | PRN
  Administered 2014-07-29: 80 mg via INTRAVENOUS

## 2014-07-29 MED ORDER — DEXAMETHASONE SODIUM PHOSPHATE 10 MG/ML IJ SOLN
INTRAMUSCULAR | Status: DC | PRN
  Administered 2014-07-29: 10 mg via INTRAVENOUS

## 2014-07-29 MED ORDER — HYDROMORPHONE HCL 2 MG/ML IJ SOLN
INTRAMUSCULAR | Status: AC
  Filled 2014-07-29: qty 1

## 2014-07-29 MED ORDER — HYDROMORPHONE HCL 1 MG/ML IJ SOLN
0.5000 mg | INTRAMUSCULAR | Status: DC | PRN
  Administered 2014-07-29 – 2014-07-30 (×5): 0.5 mg via INTRAVENOUS
  Filled 2014-07-29 (×6): qty 1

## 2014-07-29 MED ORDER — NEOSTIGMINE METHYLSULFATE 10 MG/10ML IV SOLN
INTRAVENOUS | Status: DC | PRN
  Administered 2014-07-29: 4 mg via INTRAVENOUS

## 2014-07-29 MED ORDER — PROPOFOL 10 MG/ML IV BOLUS
INTRAVENOUS | Status: AC
  Filled 2014-07-29: qty 20

## 2014-07-29 MED ORDER — ONDANSETRON HCL 4 MG/2ML IJ SOLN: 4.0000 mg | Freq: Four times a day (QID) | INTRAMUSCULAR | Status: DC | PRN

## 2014-07-29 MED ORDER — LACTATED RINGERS IR SOLN
Status: DC | PRN
  Administered 2014-07-29: 1

## 2014-07-29 MED ORDER — LIDOCAINE HCL (CARDIAC) 20 MG/ML IV SOLN
INTRAVENOUS | Status: DC | PRN
  Administered 2014-07-29: 100 mg via INTRAVENOUS

## 2014-07-29 SURGICAL SUPPLY — 49 items
APPLIER CLIP ROT 10 11.4 M/L (STAPLE)
BENZOIN TINCTURE PRP APPL 2/3 (GAUZE/BANDAGES/DRESSINGS) IMPLANT
CABLE HIGH FREQUENCY MONO STRZ (ELECTRODE) IMPLANT
CLAMP ENDO BABCK 10MM (STAPLE) IMPLANT
CLIP APPLIE ROT 10 11.4 M/L (STAPLE) IMPLANT
DECANTER SPIKE VIAL GLASS SM (MISCELLANEOUS) IMPLANT
DERMABOND ADVANCED (GAUZE/BANDAGES/DRESSINGS) ×1
DERMABOND ADVANCED .7 DNX12 (GAUZE/BANDAGES/DRESSINGS) ×1 IMPLANT
DEVICE SUT QUICK LOAD TK 5 (STAPLE) ×14 IMPLANT
DEVICE SUT TI-KNOT TK 5X26 (MISCELLANEOUS) ×2 IMPLANT
DEVICE SUTURE ENDOST 10MM (ENDOMECHANICALS) ×2 IMPLANT
DISSECTOR BLUNT TIP ENDO 5MM (MISCELLANEOUS) ×2 IMPLANT
DRAIN PENROSE 18X1/2 LTX STRL (DRAIN) ×2 IMPLANT
DRAPE LAPAROSCOPIC ABDOMINAL (DRAPES) ×2 IMPLANT
ELECT REM PT RETURN 9FT ADLT (ELECTROSURGICAL) ×2
ELECTRODE REM PT RTRN 9FT ADLT (ELECTROSURGICAL) ×1 IMPLANT
FILTER SMOKE EVAC LAPAROSHD (FILTER) IMPLANT
GLOVE BIOGEL M 8.0 STRL (GLOVE) ×2 IMPLANT
GOWN SPEC L4 XLG W/TWL (GOWN DISPOSABLE) ×2 IMPLANT
GOWN STRL REUS W/TWL XL LVL3 (GOWN DISPOSABLE) ×6 IMPLANT
GRASPER ENDO BABCOCK 10 (MISCELLANEOUS) IMPLANT
GRASPER ENDO BABCOCK 10MM (MISCELLANEOUS)
KIT BASIN OR (CUSTOM PROCEDURE TRAY) ×2 IMPLANT
PENCIL BUTTON HOLSTER BLD 10FT (ELECTRODE) ×2 IMPLANT
RELOAD ENDO STITCH (ENDOMECHANICALS) ×10 IMPLANT
SCISSORS LAP 5X45 EPIX DISP (ENDOMECHANICALS) ×2 IMPLANT
SET IRRIG TUBING LAPAROSCOPIC (IRRIGATION / IRRIGATOR) ×2 IMPLANT
SHEARS HARMONIC ACE PLUS 45CM (MISCELLANEOUS) ×2 IMPLANT
SLEEVE ADV FIXATION 12X100MM (TROCAR) ×2 IMPLANT
SLEEVE ADV FIXATION 5X100MM (TROCAR) ×2 IMPLANT
SLEEVE XCEL OPT CAN 5 100 (ENDOMECHANICALS) ×6 IMPLANT
SOLUTION ANTI FOG 6CC (MISCELLANEOUS) IMPLANT
STAPLER VISISTAT 35W (STAPLE) ×2 IMPLANT
STRIP CLOSURE SKIN 1/2X4 (GAUZE/BANDAGES/DRESSINGS) IMPLANT
SUT SURGIDAC NAB ES-9 0 48 120 (SUTURE) ×8 IMPLANT
SUT VIC AB 4-0 SH 18 (SUTURE) ×2 IMPLANT
TIP INNERVISION DETACH 40FR (MISCELLANEOUS) IMPLANT
TIP INNERVISION DETACH 50FR (MISCELLANEOUS) IMPLANT
TIP INNERVISION DETACH 56FR (MISCELLANEOUS) ×2 IMPLANT
TIPS INNERVISION DETACH 40FR (MISCELLANEOUS)
TOWEL OR 17X26 10 PK STRL BLUE (TOWEL DISPOSABLE) ×4 IMPLANT
TOWEL OR NON WOVEN STRL DISP B (DISPOSABLE) ×2 IMPLANT
TRAY FOLEY CATH 14FRSI W/METER (CATHETERS) ×2 IMPLANT
TRAY LAPAROSCOPIC (CUSTOM PROCEDURE TRAY) ×2 IMPLANT
TROCAR ADV FIXATION 11X100MM (TROCAR) ×2 IMPLANT
TROCAR ADV FIXATION 12X100MM (TROCAR) ×2 IMPLANT
TROCAR ADV FIXATION 5X100MM (TROCAR) ×4 IMPLANT
TROCAR BLADELESS OPT 5 100 (ENDOMECHANICALS) ×4 IMPLANT
TUBING FILTER THERMOFLATOR (ELECTROSURGICAL) ×2 IMPLANT

## 2014-07-29 NOTE — Op Note (Signed)
[name], ROSADO                ACCOUNT NO.:  [id number]  MEDICAL RECORD NO.:  [id number]  LOCATION:  [place]                         FACILITY:  [facility]  PHYSICIAN:  Isabel Caprice. Hassell Done, MD  DATE OF BIRTH:  09-29-1951  DATE OF PROCEDURE: DATE OF DISCHARGE:                              OPERATIVE REPORT   PREOPERATIVE INDICATIONS:  A 63 year old lady who is 3 years out from a Nissen fundoplication with 11-month history of recurrent discomfort in her upper chest, some dysphagia, and evidence of some herniation of her stomach above her wrap.  PROCEDURE:  Laparoscopic takedown of prior Nissen fundoplication, repair of hiatal hernia with a 56 bougie in place and floppy wrap created with a 3 suture Nissen fundoplication.  SURGEON:  Isabel Caprice. Hassell Done, MD  ASSISTANT:  Fenton Malling. Lucia Gaskins, MD, Dr. Lucia Gaskins also performing endoscopy.  ANESTHESIA:  General.  DESCRIPTION OF PROCEDURE:  The patient was taken to room 1 at [facility] and given general anesthesia.  The abdomen was prepped with PCMX and draped sterilely.  The abdomen was accessed through the left upper quadrant with a 5-mm Optiview technique without difficulty, the abdomen was insufflated.  A 5 and eventually a 12 was placed in the left of the umbilicus for the camera port as we started out with a 5-mm scope, both 0 and 30 but had to upgrade to a 10-mm angle scope.  To the right of the umbilicus, there was a 10, 11 and 5, and in the upper midline a 5 mm for the [facility] retractor was used.  The Sherrie Sport was placed and there were a lot of adhesions between the pulled wrap and the liver.  Pledgets had been used in closure of the hiatus before and these were fairly tenaciously stuck to the liver.  These were taken down.  I worked over on the left side and took down the stomach up to the left crus.  There was a posterior hernia.  It looked like the stomach maybe it slid above the wrap.  The wrap was taken down completely.  The old  sutures anteriorly were cut, and then we freed things from the diaphragm so we could see a generous hiatus and hiatal hernia.  The wrapped portion of the stomach I freed up in almost all of its entirety but it was stuck tenaciously to the pledgets on the right side, so I elected to leave that and then use that anchored stomach in the subsequent fundoplication.  Dr. Lucia Gaskins performed endoscopy to identify landmarks, and we had the Z-line well into the abdomen.  We then passed a 56 lighted bougie into the stomach and with that in place I elevated the esophagus and stomach and repaired the posterior hiatus with 2 simple 0 Surgidek's with tie knots.  This made for a snug closure, everything looked good.  With the 56 in place, I then performed a wrap using contiguous portions of the wrapped stomach and then on the left side bring this up to the midline and securing this with 3 sutures using Endo stitch of 2-0 Surgidek taking purchases of the esophagus between the purchases of stomach.  These were secured with tie knots.  The bougie was withdrawn.  Everything looked good.  No bleeding was seen.  The esophagogastric junction was in the wrap zone which was at least a centimeter and half and we had adequate length in the abdomen. Initially, I put to placing a stitch anteriorly and I placed it and removed it and left our calibrated closure.  Wounds were injected with Exparel and the abdomen was deflated.  The trocars were withdrawn and repaired with 4-0 Vicryl and Dermabond.  The patient tolerated the procedure well and was taken to recovery room in satisfactory condition.     Isabel Caprice Hassell Done, MD     MBM/MEDQ  D:  07/29/2014  T:  07/29/2014  Job:  [id number]  cc:   Zenovia Jarred, MD Fax: [phone]

## 2014-07-29 NOTE — Transfer of Care (Signed)
Immediate Anesthesia Transfer of Care Note  Patient: [name]  Procedure(s) Performed: Procedure(s): REDO NISSEN FUNDOPLICATION WITH UPPER ENDOSCOPY (N/A)  Patient Location: PACU  Anesthesia Type:General  Level of Consciousness: awake, alert  and oriented  Airway & Oxygen Therapy: Patient Spontanous Breathing and Patient connected to face mask oxygen  Post-op Assessment: Report given to PACU RN and Post -op Vital signs reviewed and stable  Post vital signs: Reviewed and stable  Complications: No apparent anesthesia complications

## 2014-07-29 NOTE — Anesthesia Procedure Notes (Signed)
Procedure Name: Intubation Date/Time: 07/29/2014 7:27 AM Performed by: Danley Danker L Patient Re-evaluated:Patient Re-evaluated prior to inductionOxygen Delivery Method: Circle system utilized Preoxygenation: Pre-oxygenation with 100% oxygen Intubation Type: IV induction Ventilation: Mask ventilation without difficulty and Oral airway inserted - appropriate to patient size Laryngoscope Size: Sabra Heck and 2 Grade View: Grade I Tube type: Oral Tube size: 7.5 mm Number of attempts: 1 Airway Equipment and Method: Stylet Placement Confirmation: ETT inserted through vocal cords under direct vision,  breath sounds checked- equal and bilateral and positive ETCO2 Secured at: 21 cm Tube secured with: Tape Dental Injury: Teeth and Oropharynx as per pre-operative assessment

## 2014-07-29 NOTE — Op Note (Signed)
07/29/2014  11:51 AM  PATIENT:  [name], 63 y.o., female, MRN: [id number]  PREOP DIAGNOSIS:  Redo nissen fundoplication  POSTOP DIAGNOSIS:   Redo nissen fundoplication.  Normal upper GI anatomy.  PROCEDURE:  Esophagogastroscopy  SURGEON:   Alphonsa Overall, M.D.  ANESTHESIA:   general  INDICATIONS FOR PROCEDURE:  [name] is a 63 y.o. (DOB: Nov 03, 1950)  white  female whose primary care physician is EASON,PAUL, MD and is under general anesthesia for a redo nissen fundoplication by Dr. Rockne Coons.  I am doing an upper endoscopy to evaluate the GE junction, prior wrap, stomach and esophagus.  PROCEDURE:  The patient is under general anesthesia in roo #1 at [facility].  Dr. Hassell Done is manning the laparoscope.   A flexible Pentax endoscope was passed down the throat without difficulty.  Findings include:   Esophagus:   Normal.  No injury.   GE junction at:  38 cm.  Dr. Hassell Done was able to identify that the GE junction is in the abdominal cavity.   Stomach: Normal. No injury.  Dr. Hassell Done will dictate his portion of the operation.  The patient tolerated the upper endo well.  Alphonsa Overall, MD, [facility] Pager: [phone] Office phone:  [phone]

## 2014-07-29 NOTE — Brief Op Note (Signed)
07/29/2014  10:58 AM  PATIENT:  [name]  63 y.o. female  PRE-OPERATIVE DIAGNOSIS:  Recurrent GERD  POST-OPERATIVE DIAGNOSIS:  Recurrent GERD  PROCEDURE:  Procedure(s): REDO NISSEN FUNDOPLICATION WITH UPPER ENDOSCOPY (N/A)  SURGEON:  Surgeon(s) and Role:    * Kaylyn Lim, MD - Primary    * Alphonsa Overall, MD - Assisting  PHYSICIAN ASSISTANT:   ASSISTANTS: Alphonsa Overall, MD, FACS   ANESTHESIA:   general  EBL:  Total I/O In: 2000 [I.V.:2000] Out: 200 [Urine:100; Blood:100]  BLOOD ADMINISTERED:none  DRAINS: none   LOCAL MEDICATIONS USED:  MARCAINE     SPECIMEN:  No Specimen  DISPOSITION OF SPECIMEN:  N/A  COUNTS:  YES  TOURNIQUET:  * No tourniquets in log *  DICTATION: .Other Dictation: Dictation Number [id number]  PLAN OF CARE: Admit to inpatient   PATIENT DISPOSITION:  PACU - hemodynamically stable.   Delay start of Pharmacological VTE agent (>24hrs) due to surgical blood loss or risk of bleeding: no

## 2014-07-29 NOTE — Anesthesia Postprocedure Evaluation (Signed)
  Anesthesia Post-op Note  Patient: [name]  Procedure(s) Performed: Procedure(s) (LRB): REDO NISSEN FUNDOPLICATION WITH UPPER ENDOSCOPY (N/A)  Patient Location: PACU  Anesthesia Type: General  Level of Consciousness: awake and alert   Airway and Oxygen Therapy: Patient Spontanous Breathing  Post-op Pain: mild  Post-op Assessment: Post-op Vital signs reviewed, Patient's Cardiovascular Status Stable, Respiratory Function Stable, Patent Airway and No signs of Nausea or vomiting  Last Vitals:  Filed Vitals:   07/29/14 1130  BP: 149/80  Pulse: 97  Temp:   Resp: 16    Post-op Vital Signs: stable   Complications: No apparent anesthesia complications

## 2014-07-29 NOTE — Interval H&P Note (Signed)
History and Physical Interval Note:  07/29/2014 7:15 AM  [name]  has presented today for surgery, with the diagnosis of Recurrent GERD  The various methods of treatment have been discussed with the patient and family. After consideration of risks, benefits and other options for treatment, the patient has consented to  Procedure(s): REDO NISSEN FUNDOPLICATION (N/A) as a surgical intervention .  [name] history has been reviewed, patient examined, no change in status, stable for surgery.  I have reviewed the patient's chart and labs.  Questions were answered to the patient's satisfaction.     Louis Ivery B

## 2014-07-29 NOTE — Anesthesia Preprocedure Evaluation (Addendum)
Anesthesia Evaluation  Patient identified by MRN, date of birth, ID band Patient awake    Reviewed: Allergy & Precautions, H&P , NPO status , Patient's Chart, lab work & pertinent test results  Airway Mallampati: II TM Distance: >3 FB Neck ROM: full    Dental no notable dental hx. (+) Teeth Intact, Dental Advisory Given   Pulmonary neg pulmonary ROS,  breath sounds clear to auscultation  Pulmonary exam normal       Cardiovascular Exercise Tolerance: Good hypertension, Pt. on medications negative cardio ROS  Rhythm:regular Rate:Normal     Neuro/Psych negative neurological ROS  negative psych ROS   GI/Hepatic negative GI ROS, Neg liver ROS, hiatal hernia, GERD-  Medicated and Poorly Controlled,  Endo/Other  negative endocrine ROSHypothyroidism   Renal/GU negative Renal ROS  negative genitourinary   Musculoskeletal   Abdominal   Peds  Hematology negative hematology ROS (+)   Anesthesia Other Findings   Reproductive/Obstetrics negative OB ROS                          Anesthesia Physical Anesthesia Plan  ASA: II  Anesthesia Plan: General   Post-op Pain Management:    Induction: Intravenous  Airway Management Planned: Oral ETT  Additional Equipment:   Intra-op Plan:   Post-operative Plan: Extubation in OR  Informed Consent: I have reviewed the patients History and Physical, chart, labs and discussed the procedure including the risks, benefits and alternatives for the proposed anesthesia with the patient or authorized representative who has indicated his/her understanding and acceptance.   Dental Advisory Given  Plan Discussed with: CRNA and Surgeon  Anesthesia Plan Comments:         Anesthesia Quick Evaluation

## 2014-07-30 ENCOUNTER — Inpatient Hospital Stay (HOSPITAL_COMMUNITY): Payer: BC Managed Care – PPO

## 2014-07-30 LAB — BASIC METABOLIC PANEL
Anion gap: 10 (ref 5–15)
BUN: 10 mg/dL (ref 6–23)
CO2: 26 mEq/L (ref 19–32)
Calcium: 8.1 mg/dL — ABNORMAL LOW (ref 8.4–10.5)
Chloride: 103 mEq/L (ref 96–112)
Creatinine, Ser: 0.64 mg/dL (ref 0.50–1.10)
GFR calc Af Amer: >90 mL/min (ref >90)
GFR calc non Af Amer: >90 mL/min (ref >90)
Glucose, Bld: 113 mg/dL — ABNORMAL HIGH (ref 70–99)
Potassium: 4.1 mEq/L (ref 3.7–5.3)
Sodium: 139 mEq/L (ref 137–147)

## 2014-07-30 LAB — CBC
HCT: 32.5 % — ABNORMAL LOW (ref 36.0–46.0)
Hemoglobin: 11.1 g/dL — ABNORMAL LOW (ref 12.0–15.0)
MCH: 32.6 pg (ref 26.0–34.0)
MCHC: 34.2 g/dL (ref 30.0–36.0)
MCV: 95.3 fL (ref 78.0–100.0)
Platelets: 254 10*3/uL (ref 150–400)
RBC: 3.41 MIL/uL — ABNORMAL LOW (ref 3.87–5.11)
RDW: 13.3 % (ref 11.5–15.5)
WBC: 8.6 10*3/uL (ref 4.0–10.5)

## 2014-07-30 MED ORDER — PHENOL 1.4 % MT LIQD: 2.0000 | OROMUCOSAL | Status: DC | PRN

## 2014-07-30 MED ORDER — IOHEXOL 300 MG/ML  SOLN
50.0000 mL | Freq: Once | INTRAMUSCULAR | Status: AC | PRN
  Administered 2014-07-30: 30 mL via ORAL

## 2014-07-30 MED ORDER — SODIUM CHLORIDE 0.9 % IV SOLN: 250.0000 mL | INTRAVENOUS | Status: DC | PRN

## 2014-07-30 MED ORDER — SODIUM CHLORIDE 0.9 % IJ SOLN
3.0000 mL | Freq: Two times a day (BID) | INTRAMUSCULAR | Status: DC
  Administered 2014-07-30 (×2): 3 mL via INTRAVENOUS

## 2014-07-30 MED ORDER — BISACODYL 10 MG RE SUPP
10.0000 mg | Freq: Two times a day (BID) | RECTAL | Status: DC | PRN
Start: 2014-07-30 — End: 2014-07-31

## 2014-07-30 MED ORDER — SODIUM CHLORIDE 0.9 % IJ SOLN
3.0000 mL | INTRAMUSCULAR | Status: DC | PRN
Start: 2014-07-30 — End: 2014-07-31

## 2014-07-30 MED ORDER — MENTHOL 3 MG MT LOZG: 1.0000 | LOZENGE | OROMUCOSAL | Status: DC | PRN

## 2014-07-30 MED ORDER — ZOLPIDEM TARTRATE 5 MG PO TABS: 5.0000 mg | ORAL_TABLET | Freq: Every evening | ORAL | Status: DC | PRN

## 2014-07-30 MED ORDER — ACETAMINOPHEN 650 MG RE SUPP: 650.0000 mg | Freq: Four times a day (QID) | RECTAL | Status: DC | PRN

## 2014-07-30 MED ORDER — DIPHENHYDRAMINE HCL 50 MG/ML IJ SOLN: 12.5000 mg | Freq: Four times a day (QID) | INTRAMUSCULAR | Status: DC | PRN

## 2014-07-30 MED ORDER — ALUM & MAG HYDROXIDE-SIMETH 200-200-20 MG/5ML PO SUSP: 30.0000 mL | Freq: Four times a day (QID) | ORAL | Status: DC | PRN

## 2014-07-30 MED ORDER — MAGIC MOUTHWASH: 15.0000 mL | Freq: Four times a day (QID) | ORAL | Status: DC | PRN

## 2014-07-30 MED ORDER — LIP MEDEX EX OINT
1.0000 "application " | TOPICAL_OINTMENT | Freq: Two times a day (BID) | CUTANEOUS | Status: DC
  Administered 2014-07-30 (×2): 1 via TOPICAL
  Filled 2014-07-30: qty 7

## 2014-07-30 MED ORDER — POLYETHYLENE GLYCOL 3350 17 G PO PACK
17.0000 g | PACK | Freq: Two times a day (BID) | ORAL | Status: DC | PRN
  Filled 2014-07-30: qty 1

## 2014-07-30 MED ORDER — OXYCODONE HCL 5 MG PO TABS: 5.0000 mg | ORAL_TABLET | ORAL | Status: DC | PRN

## 2014-07-30 MED ORDER — HYDROMORPHONE HCL 1 MG/ML IJ SOLN
0.5000 mg | INTRAMUSCULAR | Status: DC | PRN
  Administered 2014-07-30 (×2): 1 mg via INTRAVENOUS
  Filled 2014-07-30 (×2): qty 1

## 2014-07-30 MED ORDER — ACETAMINOPHEN 325 MG PO TABS
325.0000 mg | ORAL_TABLET | Freq: Four times a day (QID) | ORAL | Status: DC | PRN
  Filled 2014-07-30: qty 2

## 2014-07-30 MED ORDER — LACTATED RINGERS IV BOLUS (SEPSIS): 1000.0000 mL | Freq: Three times a day (TID) | INTRAVENOUS | Status: DC | PRN

## 2014-07-30 MED ORDER — PROMETHAZINE HCL 25 MG/ML IJ SOLN: 6.2500 mg | INTRAMUSCULAR | Status: DC | PRN

## 2014-07-30 MED ORDER — METOPROLOL TARTRATE 1 MG/ML IV SOLN
5.0000 mg | Freq: Four times a day (QID) | INTRAVENOUS | Status: DC | PRN
  Filled 2014-07-30: qty 5

## 2014-07-30 NOTE — Progress Notes (Signed)
Water soluble UGI demonstrates no leak and prompt emptying of the esophagus.

## 2014-07-30 NOTE — Progress Notes (Signed)
1 Day Post-Op Lap Redo Nissen Fundoplication Subjective: Doing well, no nausea, pain controlled, good uop  Objective: Vital signs in last 24 hours: Temp:  [97.9 F (36.6 C)-99 F (37.2 C)] 98.8 F (37.1 C) (10/10 0600) Pulse Rate:  [76-119] 76 (10/10 0600) Resp:  [11-31] 16 (10/10 0600) BP: (103-157)/(52-102) 111/54 mmHg (10/10 0600) SpO2:  [94 %-100 %] 98 % (10/10 0600)   Intake/Output from previous day: 10/09 0701 - 10/10 0700 In: 4746.7 [I.V.:4646.7; IV Piggyback:100] Out: 1250 [Urine:1150; Blood:100] Intake/Output this shift:     General appearance: alert and cooperative GI: normal findings: soft, non-tender  Incision: no significant drainage, no significant erythema  Lab Results:   Recent Labs  07/29/14 1337 07/30/14 0512  WBC 11.8* 8.6  HGB 12.3 11.1*  HCT 36.1 32.5*  PLT 271 254   BMET  Recent Labs  07/29/14 1337 07/30/14 0512  NA  --  139  K  --  4.1  CL  --  103  CO2  --  26  GLUCOSE  --  113*  BUN  --  10  CREATININE 0.67 0.64  CALCIUM  --  8.1*   PT/INR No results found for this basename: LABPROT, INR,  in the last 72 hours ABG No results found for this basename: PHART, PCO2, PO2, HCO3,  in the last 72 hours  MEDS, Scheduled . heparin  5,000 Units Subcutaneous 3 times per day    Studies/Results: No results found.  Assessment: s/p Procedure(s): REDO NISSEN FUNDOPLICATION WITH UPPER ENDOSCOPY Patient Active Problem List   Diagnosis Date Noted  . Status post laparoscopic Nissen fundoplication 42/68/3419  . Abdominal pain, epigastric 05/20/2014  . Diarrhea 03/29/2013  . Nonspecific (abnormal) findings on radiological and other examination of gastrointestinal tract 03/29/2013  . GERD (gastroesophageal reflux disease) 05/14/2011  . Bloating 05/14/2011  . Gas 05/14/2011  . Barrett esophagus 05/14/2011  . S/P redo revision of Lap Nissen Oct 2015 04/12/2011  . Duodenitis 04/12/2011  . Abdominal gas pain 04/12/2011  . HYPOTHYROIDISM  07/17/2010  . BARRETT'S ESOPHAGUS 11/10/2008  . GASTRITIS 10/05/2008  . LACTOSE INTOLERANCE 10/04/2008  . HYPERTENSION 10/04/2008  . GERD 10/04/2008  . HIATAL HERNIA 10/04/2008  . OSTEOPOROSIS 10/04/2008  . FLATULENCE ERUCTATION AND GAS PAIN 10/04/2008  . DIVERTICULOSIS, COLON 10/03/2008    Doing well  Plan: d/c foley Swallow study this am.  Will start clears if swallow is neg for a leak.   Decrease MIV Ambulate   LOS: 1 day     .Rosario Adie, MD [facility], [place]   07/30/2014 9:10 AM

## 2014-07-31 LAB — BASIC METABOLIC PANEL
Anion gap: 11 (ref 5–15)
BUN: 7 mg/dL (ref 6–23)
CO2: 25 mEq/L (ref 19–32)
Calcium: 8.3 mg/dL — ABNORMAL LOW (ref 8.4–10.5)
Chloride: 107 mEq/L (ref 96–112)
Creatinine, Ser: 0.58 mg/dL (ref 0.50–1.10)
GFR calc Af Amer: >90 mL/min (ref >90)
GFR calc non Af Amer: >90 mL/min (ref >90)
Glucose, Bld: 86 mg/dL (ref 70–99)
Potassium: 3.8 mEq/L (ref 3.7–5.3)
Sodium: 143 mEq/L (ref 137–147)

## 2014-07-31 LAB — CBC
HCT: 33.7 % — ABNORMAL LOW (ref 36.0–46.0)
Hemoglobin: 11.5 g/dL — ABNORMAL LOW (ref 12.0–15.0)
MCH: 32.3 pg (ref 26.0–34.0)
MCHC: 34.1 g/dL (ref 30.0–36.0)
MCV: 94.7 fL (ref 78.0–100.0)
Platelets: 238 10*3/uL (ref 150–400)
RBC: 3.56 MIL/uL — ABNORMAL LOW (ref 3.87–5.11)
RDW: 13 % (ref 11.5–15.5)
WBC: 8.2 10*3/uL (ref 4.0–10.5)

## 2014-07-31 MED ORDER — PROMETHAZINE HCL 25 MG RE SUPP: 25.0000 mg | Freq: Four times a day (QID) | RECTAL | Status: DC | PRN

## 2014-07-31 MED ORDER — ONDANSETRON HCL 4 MG PO TABS: 4.0000 mg | ORAL_TABLET | Freq: Three times a day (TID) | ORAL | Status: DC | PRN

## 2014-07-31 MED ORDER — TRAMADOL HCL 50 MG PO TABS: 50.0000 mg | ORAL_TABLET | Freq: Four times a day (QID) | ORAL | Status: DC | PRN

## 2014-07-31 NOTE — Progress Notes (Signed)
Assessment unchanged. Pt and husband verbalized understanding of dc instructions through teach back. No scripts at dc. Pt understands diet to follow at home. Discharged via wc to front entrance to meet awaiting vehicle to carry home. Accompanied by NT and husband.

## 2014-07-31 NOTE — Discharge Summary (Signed)
Physician Discharge Summary  Patient ID: GERRE RANUM MRN: [id number] DOB/AGE: [birth year] 63 y.o.  Admit date: 07/29/2014 Discharge date: 07/31/2014  Patient Care Team: Eber Hong, MD as PCP - General (Internal Medicine)  Admission Diagnoses: Active Problems:   Status post laparoscopic Nissen fundoplication   Discharge Diagnoses:  Active Problems:   Status post laparoscopic Nissen fundoplication   POST-OPERATIVE DIAGNOSIS:  Recurrent GERD  SURGERY:  Procedure(s): REDO NISSEN FUNDOPLICATION WITH UPPER ENDOSCOPY  SURGEON:  Surgeon(s): Kaylyn Lim, MD Alphonsa Overall, MD  Consults: None  Hospital Course:   The patient underwent the surgery above.  Postoperatively, the patient gradually mobilized and advanced to a solid diet.  Pain and other symptoms were treated aggressively.  Postoperative upper GI showed no leak or obstruction.  She was started on a liquid diet.  By the time of discharge, the patient was walking well the hallways, drinking liquids, having flatus.  Pain was well-controlled on an oral medications.  Based on meeting discharge criteria and continuing to recover, I felt it was safe for the patient to be discharged from the hospital to further recover with close followup. Postoperative recommendations were discussed in detail.  They are written as well.   Significant Diagnostic Studies:  Results for orders placed during the hospital encounter of 07/29/14 (from the past 72 hour(s))  CBC     Status: Abnormal   Collection Time    07/29/14  1:37 PM      Result Value Ref Range   WBC 11.8 (*) 4.0 - 10.5 K/uL   RBC 3.84 (*) 3.87 - 5.11 MIL/uL   Hemoglobin 12.3  12.0 - 15.0 g/dL   HCT 36.1  36.0 - 46.0 %   MCV 94.0  78.0 - 100.0 fL   MCH 32.0  26.0 - 34.0 pg   MCHC 34.1  30.0 - 36.0 g/dL   RDW 13.0  11.5 - 15.5 %   Platelets 271  150 - 400 K/uL  CREATININE, SERUM     Status: None   Collection Time    07/29/14  1:37 PM      Result Value Ref Range   Creatinine,  Ser 0.67  0.50 - 1.10 mg/dL   GFR calc non Af Amer >90  >90 mL/min   GFR calc Af Amer >90  >90 mL/min   Comment: (NOTE)     The eGFR has been calculated using the CKD EPI equation.     This calculation has not been validated in all clinical situations.     eGFR's persistently <90 mL/min signify possible Chronic Kidney     Disease.  CBC     Status: Abnormal   Collection Time    07/30/14  5:12 AM      Result Value Ref Range   WBC 8.6  4.0 - 10.5 K/uL   RBC 3.41 (*) 3.87 - 5.11 MIL/uL   Hemoglobin 11.1 (*) 12.0 - 15.0 g/dL   HCT 32.5 (*) 36.0 - 46.0 %   MCV 95.3  78.0 - 100.0 fL   MCH 32.6  26.0 - 34.0 pg   MCHC 34.2  30.0 - 36.0 g/dL   RDW 13.3  11.5 - 15.5 %   Platelets 254  150 - 400 K/uL  BASIC METABOLIC PANEL     Status: Abnormal   Collection Time    07/30/14  5:12 AM      Result Value Ref Range   Sodium 139  137 - 147 mEq/L   Potassium 4.1  3.7 -  5.3 mEq/L   Chloride 103  96 - 112 mEq/L   CO2 26  19 - 32 mEq/L   Glucose, Bld 113 (*) 70 - 99 mg/dL   BUN 10  6 - 23 mg/dL   Creatinine, Ser 0.64  0.50 - 1.10 mg/dL   Calcium 8.1 (*) 8.4 - 10.5 mg/dL   GFR calc non Af Amer >90  >90 mL/min   GFR calc Af Amer >90  >90 mL/min   Comment: (NOTE)     The eGFR has been calculated using the CKD EPI equation.     This calculation has not been validated in all clinical situations.     eGFR's persistently <90 mL/min signify possible Chronic Kidney     Disease.   Anion gap 10  5 - 15  CBC     Status: Abnormal   Collection Time    07/31/14  5:51 AM      Result Value Ref Range   WBC 8.2  4.0 - 10.5 K/uL   RBC 3.56 (*) 3.87 - 5.11 MIL/uL   Hemoglobin 11.5 (*) 12.0 - 15.0 g/dL   HCT 33.7 (*) 36.0 - 46.0 %   MCV 94.7  78.0 - 100.0 fL   MCH 32.3  26.0 - 34.0 pg   MCHC 34.1  30.0 - 36.0 g/dL   RDW 13.0  11.5 - 15.5 %   Platelets 238  150 - 400 K/uL  BASIC METABOLIC PANEL     Status: Abnormal   Collection Time    07/31/14  5:51 AM      Result Value Ref Range   Sodium 143  137 - 147  mEq/L   Potassium 3.8  3.7 - 5.3 mEq/L   Chloride 107  96 - 112 mEq/L   CO2 25  19 - 32 mEq/L   Glucose, Bld 86  70 - 99 mg/dL   BUN 7  6 - 23 mg/dL   Creatinine, Ser 0.58  0.50 - 1.10 mg/dL   Calcium 8.3 (*) 8.4 - 10.5 mg/dL   GFR calc non Af Amer >90  >90 mL/min   GFR calc Af Amer >90  >90 mL/min   Comment: (NOTE)     The eGFR has been calculated using the CKD EPI equation.     This calculation has not been validated in all clinical situations.     eGFR's persistently <90 mL/min signify possible Chronic Kidney     Disease.   Anion gap 11  5 - 15    Dg Ugi W/water Sol Cm  07/30/2014   CLINICAL DATA:  Revision of Nissen fundoplication yesterday with paraesophageal hernia repair. Evaluate for postoperative leak.  EXAM: WATER SOLUBLE UPPER GI SERIES  TECHNIQUE: Single-column upper GI series was performed using water soluble contrast.  CONTRAST:  14m OMNIPAQUE IOHEXOL 300 MG/ML orally.  COMPARISON:  Preoperative esophagram 05/30/2014. Remote upper GI series 12/05/2008.  FLUOROSCOPY TIME:  39 sec, pulsed fluoroscopy.  FINDINGS: Preliminary scout AP supine abdominal image demonstrates opaque suture material in the upper abdomen to the left of midline at or near the esophagogastric junction. Bowel gas pattern unremarkable. Expected stool burden.  Patient swallowed the water-soluble contrast without difficulty. The Nissen fundoplication has a normal appearance. There is no evidence of leak. The esophagus empties promptly with the patient in the semi-erect position. A very small amount of residual contrast is present in the esophagus at the end of the procedure.  There is mucosal irregularity involving the distal esophagus, particularly along  its left lateral wall, associated with fold thickening. This is more prominent than was seen on the preoperative examination.  There is no evidence of residual paraesophageal hernia. The visualized portion of the stomach is unremarkable and the stomach empties  promptly.  IMPRESSION: 1. Satisfactory appearance of the Nissen fundoplication. The esophagus empties promptly and there is no evidence of leak. 2. Postoperative edema/inflammation involving the distal esophagus. 3. No evidence of residual paraesophageal hernia.   Electronically Signed   By: Evangeline Dakin M.D.   On: 07/30/2014 10:31    Discharge Exam: Blood pressure 112/78, pulse 89, temperature 98.9 F (37.2 C), temperature source Oral, resp. rate 18, height '5\' 4"'  (1.626 m), weight 161 lb (73.029 kg), SpO2 94.00%.  General: Pt awake/alert/oriented x4 in no major acute distress Eyes: PERRL, normal EOM. Sclera nonicteric Neuro: CN II-XII intact w/o focal sensory/motor deficits. Lymph: No head/neck/groin lymphadenopathy Psych:  No delerium/psychosis/paranoia HENT: Normocephalic, Mucus membranes moist.  No thrush.  Mild hoarseness (improving per pt) Neck: Supple, No tracheal deviation Chest: No pain.  Good respiratory excursion. CV:  Pulses intact.  Regular rhythm MS: Normal AROM mjr joints.  No obvious deformity Abdomen: Soft, Nondistended.  Nontender.  No incarcerated hernias. Ext:  SCDs BLE.  No significant edema.  No cyanosis Skin: No petechiae / purpura  Discharged Condition: good   Past Medical History  Diagnosis Date  . Hypertension   . Barrett's esophagus   . IBS (irritable bowel syndrome)   . GERD (gastroesophageal reflux disease)   . Hiatal hernia   . Thyroid disease   . Diverticulosis of colon (without mention of hemorrhage)   . Hypothyroidism   . Cancer     hx of skin cancer     Past Surgical History  Procedure Laterality Date  . Total vaginal hysterectomy    . Colonoscopy  2009    562.10  . Tmj arthroplasty    . Trigger finger release    . Nissen fundoplication  2542  . Appendectomy    . Skin cancer removal from right thigh       History   Social History  . Marital Status: Married    Spouse Name: N/A    Number of Children: N/A  . Years of  Education: N/A   Occupational History  . Not on file.   Social History Main Topics  . Smoking status: Never Smoker   . Smokeless tobacco: Never Used  . Alcohol Use: No  . Drug Use: No  . Sexual Activity: Not on file   Other Topics Concern  . Not on file   Social History Narrative  . No narrative on file    Family History  Problem Relation Age of Onset  . Cancer Father 55    MOUTH AND THROAT  . Esophageal cancer Father   . Colon cancer Neg Hx     Current Facility-Administered Medications  Medication Dose Route Frequency Provider Last Rate Last Dose  . 0.9 %  sodium chloride infusion  250 mL Intravenous PRN Michael Boston, MD      . acetaminophen (TYLENOL) suppository 650 mg  650 mg Rectal Q6H PRN Michael Boston, MD      . acetaminophen (TYLENOL) tablet 325-650 mg  325-650 mg Oral Q6H PRN Michael Boston, MD      . alum & mag hydroxide-simeth (MAALOX/MYLANTA) 200-200-20 MG/5ML suspension 30 mL  30 mL Oral Q6H PRN Michael Boston, MD      . bisacodyl (DULCOLAX) suppository 10 mg  10  mg Rectal Q12H PRN Michael Boston, MD      . diphenhydrAMINE (BENADRYL) injection 12.5-25 mg  12.5-25 mg Intravenous Q6H PRN Michael Boston, MD      . heparin injection 5,000 Units  5,000 Units Subcutaneous 3 times per day Kaylyn Lim, MD   5,000 Units at 07/31/14 [phone]  . HYDROmorphone (DILAUDID) injection 0.5-2 mg  0.5-2 mg Intravenous Q2H PRN Michael Boston, MD   1 mg at 07/30/14 1915  . lactated ringers bolus 1,000 mL  1,000 mL Intravenous Q8H PRN Michael Boston, MD      . lip balm (CARMEX) ointment 1 application  1 application Topical BID Michael Boston, MD   1 application at 32/35/57 2142  . magic mouthwash  15 mL Oral QID PRN Michael Boston, MD      . menthol-cetylpyridinium (CEPACOL) lozenge 3 mg  1 lozenge Oral PRN Michael Boston, MD      . metoprolol (LOPRESSOR) injection 5 mg  5 mg Intravenous Q6H PRN Michael Boston, MD      . ondansetron [facility]) tablet 4 mg  4 mg Oral Q6H PRN Kaylyn Lim, MD       Or  .  ondansetron (ZOFRAN) injection 4 mg  4 mg Intravenous Q6H PRN Kaylyn Lim, MD      . oxyCODONE (Oxy IR/ROXICODONE) immediate release tablet 5-10 mg  5-10 mg Oral Q4H PRN Michael Boston, MD      . phenol (CHLORASEPTIC) mouth spray 2 spray  2 spray Mouth/Throat PRN Michael Boston, MD      . polyethylene glycol (MIRALAX / GLYCOLAX) packet 17 g  17 g Oral Q12H PRN Michael Boston, MD      . promethazine (PHENERGAN) injection 6.25-12.5 mg  6.25-12.5 mg Intravenous Q4H PRN Michael Boston, MD      . sodium chloride 0.9 % injection 3 mL  3 mL Intravenous Q12H Michael Boston, MD   3 mL at 07/30/14 2143  . sodium chloride 0.9 % injection 3 mL  3 mL Intravenous PRN Michael Boston, MD      . zolpidem (AMBIEN) tablet 5 mg  5 mg Oral QHS PRN Michael Boston, MD         Allergies  Allergen Reactions  . Alendronate Sodium Other (See Comments)    Irritates GERD  . Ciprofloxacin     REACTION: rash, hives  . Codeine Nausea Only    REACTION: NAUSEA  . Tetracycline     REACTION: rash, hives    Disposition:   Discharge Instructions   Call MD for:  extreme fatigue    Complete by:  As directed      Call MD for:  hives    Complete by:  As directed      Call MD for:  persistant nausea and vomiting    Complete by:  As directed      Call MD for:  redness, tenderness, or signs of infection (pain, swelling, redness, odor or green/yellow discharge around incision site)    Complete by:  As directed      Call MD for:  severe uncontrolled pain    Complete by:  As directed      Call MD for:    Complete by:  As directed   Temperature > 101.72F     Discharge instructions    Complete by:  As directed   Please see discharge instruction sheets.  Also refer to handout given an office.  Please call our office if you have any questions or concerns (  [phone]     Discharge wound care:    Complete by:  As directed   If you have closed incisions, shower and bathe over these incisions with soap and water every day.  Remove all  surgical dressings on postoperative day #3.  You do not need to replace dressings over the closed incisions unless you feel more comfortable with a Band-Aid covering it.   If you have an open wound that requires packing, please see wound care instructions.  In general, remove all dressings, wash wound with soap and water and then replace with saline moistened gauze.  Do the dressing change at least every day.  Please call our office [phone] if you have further questions.     Driving Restrictions    Complete by:  As directed   No driving until off narcotics and can safely swerve away without pain during an emergency     Increase activity slowly    Complete by:  As directed   Walk an hour a day.  Use 20-30 minute walks.  When you can walk 30 minutes without difficulty, increase to low impact/moderate activities such as biking, jogging, swimming, sexual activity..  Eventually can increase to unrestricted activity when not feeling pain.  If you feel pain: STOP!Marland Kitchen   Let pain protect you from overdoing it.  Use ice/heat/over-the-counter pain medications to help minimize his soreness.  Use pain prescriptions as needed to remain active.  It is better to take extra pain medications and be more active than to stay bedridden to avoid all pain medications.     Lifting restrictions    Complete by:  As directed   Avoid heavy lifting initially.  Do not push through pain.  You have no specific weight limit.  Coughing and sneezing or four more stressful to your incision than any lifting you will do. Pain will protect you from injury.  Therefore, avoid intense activity until off all narcotic pain medications.  Coughing and sneezing or four more stressful to your incision than any lifting he will do.     May shower / Bathe    Complete by:  As directed      May walk up steps    Complete by:  As directed      Sexual Activity Restrictions    Complete by:  As directed   Sexual activity as tolerated.  Do not push  through pain.  Pain will protect you from injury.     Walk with assistance    Complete by:  As directed   Walk over an hour a day.  May use a walker/cane/companion to help with balance and stamina.            Medication List         acetaminophen 325 MG tablet  Commonly known as:  TYLENOL  Take 325 mg by mouth at bedtime.     ALIGN 4 MG Caps  Take 1 capsule by mouth every morning.     aspirin 81 MG tablet  Take 81 mg by mouth every morning.     CALCIUM 600 + D PO  Take 1 tablet by mouth daily at 12 noon.     cefUROXime 250 MG tablet  Commonly known as:  CEFTIN  Take 250 mg by mouth 2 (two) times daily with a meal. paie int started on 07/11/2014 to complete on 07/21/2014.     diphenhydrAMINE 25 mg capsule  Commonly known as:  BENADRYL  Take 25 mg  by mouth every 6 (six) hours as needed. For sinus drainage     EVISTA 60 MG tablet  Generic drug:  raloxifene  Take 60 mg by mouth every morning.     famotidine 20 MG tablet  Commonly known as:  PEPCID  Take 20 mg by mouth 3 (three) times daily.     hydrochlorothiazide 25 MG tablet  Commonly known as:  HYDRODIURIL  Take 25 mg by mouth every morning.     hyoscyamine 0.125 MG SL tablet  Commonly known as:  LEVSIN SL  Place 0.125 mg under the tongue every 6 (six) hours as needed for cramping.     levothyroxine 88 MCG tablet  Commonly known as:  SYNTHROID, LEVOTHROID  Take 88 mcg by mouth every morning.     multivitamin tablet  Take 1 tablet by mouth daily at 12 noon.     ondansetron 4 MG tablet  Commonly known as:  ZOFRAN  Take 1 tablet (4 mg total) by mouth every 8 (eight) hours as needed for nausea.     polyvinyl alcohol 1.4 % ophthalmic solution  Commonly known as:  LIQUIFILM TEARS  Place 2 drops into both eyes daily as needed for dry eyes (allergies.).     promethazine 25 MG suppository  Commonly known as:  PHENERGAN  Place 1 suppository (25 mg total) rectally every 6 (six) hours as needed for nausea.      traMADol 50 MG tablet  Commonly known as:  ULTRAM  Take 1 tablet (50 mg total) by mouth every 6 (six) hours as needed for moderate pain.     valACYclovir 1000 MG tablet  Commonly known as:  VALTREX  Take 1,000 mg by mouth every morning.           Follow-up Information   Follow up with Johnathan Hausen B, MD In 3 weeks. (To follow up after your operation, To follow up after your hospital stay)    Specialty:  General Surgery   Contact information:   [address] [place] [address] [phone]        Signed: Morton Peters, M.D., F.A.C.S. Gastrointestinal and Minimally Invasive Surgery [facility] [address] [place], [place] [address] [phone] Main / Paging   07/31/2014, 10:00 AM

## 2014-07-31 NOTE — Discharge Instructions (Signed)
EATING AFTER YOUR ESOPHAGEAL SURGERY (Stomach Fundoplication, Hiatal Hernia repair, Achalasia surgery, etc)  After your esophageal surgery, expect some sticking with swallowing over the next 1-2 months.    If food sticks when you eat, it is called "dysphagia".  This is due to swelling around your esophagus at the wrap & hiatal diaphragm repair.  It will gradually ease off over the next few months.  To help you through this temporary phase, we start you out on a pureed (blenderized) diet.  Your first meal in the hospital was thin liquids.  You should have been given a pureed diet by the time you left the hospital.  We ask patients to stay on a pureed diet for the first 2-3 weeks to avoid anything getting "stuck" near your recent surgery.  Don't be alarmed if your ability to swallow doesn't progress according to this plan.  Everyone is different and some diets can advance more or less quickly.     Some BASIC RULES to follow are:  Maintain an upright position whenever eating or drinking.  Take small bites - just a teaspoon size bite at a time.  Eat slowly.  It may also help to eat only one food at a time.  Consider nibbling through smaller, more frequent meals & avoid the urge to eat BIG meals  Do not push through feelings of fullness, nausea, or bloatedness  Do not mix solid foods and liquids in the same mouthful  Try not to "wash foods down" with large gulps of liquids. Avoid carbonated (bubbly/fizzy) drinks.  Understand that it will be hard to burp and belch at first.  This gradually improves with time.  Expect to be more gassy/flatulent/bloated initially.  Walking will help you work through that.  Maalox/Gas-X can help as well.  Eat in a relaxed atmosphere & minimize distractions.  Avoid talking while eating.    Do not use straws.  Following each meal, sit in an upright position (90 degree angle) for 60 to 90 minutes.  Going for a short walk can help as well  If food does stick,  don't panic.  Try to relax and let the food pass on its own.  Sipping WARM LIQUID such as strong hot black tea can also help slide it down.   Be gradual in changes & use common sense:  -If you easily tolerating a certain "level" of foods, advance to the next level gradually -If you are having trouble swallowing a particular food, then avoid it.   -If food is sticking when you advance your diet, go back to thinner previous diet (the lower LEVEL) for 1-2 days.  LEVEL 1 = PUREED DIET  Do for the first 2 WEEKS AFTER SURGERY  -Foods in this group are pureed or blenderized to a smooth, mashed potato-like consistency.  -If necessary, the pureed foods can keep their shape with the addition of a thickening agent.   -Meat should be pureed to a smooth, pasty consistency.  Hot broth or gravy may be added to the pureed meat, approximately 1 oz. of liquid per 3 oz. serving of meat. -CAUTION:  If any foods do not puree into a smooth consistency, swallowing will be more difficult.  (For example, nuts or seeds sometimes do not blend well.)  Hot Foods Cold Foods  Pureed scrambled eggs and cheese Pureed cottage cheese  Baby cereals Thickened juices and nectars  Thinned cooked cereals (no lumps) Thickened milk or eggnog  Pureed [place] toast or pancakes Ensure  Mashed  potatoes Ice cream  Pureed parsley, au gratin, scalloped potatoes, candied sweet potatoes Fruit or [place] ice, sherbet  Pureed buttered or alfredo noodles Plain yogurt  Pureed vegetables (no corn or peas) Instant breakfast  Pureed soups and creamed soups Smooth pudding, mousse, custard  Pureed scalloped apples Whipped gelatin  Gravies Sugar, syrup, honey, jelly  Sauces, cheese, tomato, barbecue, white, creamed Cream  Any baby food Creamer  Alcohol in moderation (not beer or champagne) Margarine  Coffee or tea Mayonnaise   Ketchup, mustard   Apple sauce   SAMPLE MENU:  PUREED DIET Breakfast Lunch Dinner   Orange juice, 1/2  cup  Cream of wheat, 1/2 cup  Pineapple juice, 1/2 cup  Pureed [place], barley soup, 3/4 cup  Pureed Hawaiian chicken, 3 oz   Scrambled eggs, mashed or blended with cheese, 1/2 cup  Tea or coffee, 1 cup   Whole milk, 1 cup   Non-dairy creamer, 2 Tbsp.  Mashed potatoes, 1/2 cup  Pureed cooled broccoli, 1/2 cup  Apple sauce, 1/2 cup  Coffee or tea  Mashed potatoes, 1/2 cup  Pureed spinach, 1/2 cup  Frozen yogurt, 1/2 cup  Tea or coffee      LEVEL 2 = SOFT DIET  After your first 2 weeks, you can advance to a soft diet.   Keep on this diet until everything goes down easily.  Hot Foods Cold Foods  White fish Cottage cheese  Stuffed fish Junior baby fruit  Baby food meals Semi thickened juices  Minced soft cooked, scrambled, poached eggs nectars  Souffle & omelets Ripe mashed bananas  Cooked cereals Canned fruit, pineapple sauce, milk  potatoes Milkshake  Buttered or Alfredo noodles Custard  Cooked cooled vegetable Puddings, including tapioca  Sherbet Yogurt  Vegetable soup or alphabet soup Fruit ice, [place] ice  Gravies Whipped gelatin  Sugar, syrup, honey, jelly Junior baby desserts  Sauces:  Cheese, creamed, barbecue, tomato, white Cream  Coffee or tea Margarine   SAMPLE MENU:  LEVEL 2 Breakfast Lunch Dinner   Orange juice, 1/2 cup  Oatmeal, 1/2 cup  Scrambled eggs with cheese, 1/2 cup  Decaffeinated tea, 1 cup  Whole milk, 1 cup  Non-dairy creamer, 2 Tbsp  Pineapple juice, 1/2 cup  Minced beef, 3 oz  Gravy, 2 Tbsp  Mashed potatoes, 1/2 cup  Minced fresh broccoli, 1/2 cup  Applesauce, 1/2 cup  Coffee, 1 cup  [place], barley soup, 3/4 cup  Minced Hawaiian chicken, 3 oz  Mashed potatoes, 1/2 cup  Cooked spinach, 1/2 cup  Frozen yogurt, 1/2 cup  Non-dairy creamer, 2 Tbsp      LEVEL 3 = CHOPPED DIET  -After all the foods in level 2 (soft diet) are passing through well you should advance up to more chopped foods.  -It is still  important to cut these foods into small pieces and eat slowly.  Hot Foods Cold Foods  Poultry Cottage cheese  Chopped Swedish meatballs Yogurt  Meat salads (ground or flaked meat) Milk  Flaked fish (tuna) Milkshakes  Poached or scrambled eggs Soft, cold, dry cereal  Souffles and omelets Fruit juices or nectars  Cooked cereals Chopped canned fruit  Chopped [place] toast or pancakes Canned fruit cocktail  Noodles or pasta (no rice) Pudding, mousse, custard  Cooked vegetables (no frozen peas, corn, or mixed vegetables) Green salad  Canned small sweet peas Ice cream  Creamed soup or vegetable soup Fruit ice, [place] ice  Pureed vegetable soup or alphabet soup Non-dairy creamer  Ground scalloped  apples Margarine  Gravies Mayonnaise  Sauces:  Cheese, creamed, barbecue, tomato, white Ketchup  Coffee or tea Mustard   SAMPLE MENU:  LEVEL 3 Breakfast Lunch Dinner   Orange juice, 1/2 cup  Oatmeal, 1/2 cup  Scrambled eggs with cheese, 1/2 cup  Decaffeinated tea, 1 cup  Whole milk, 1 cup  Non-dairy creamer, 2 Tbsp  Ketchup, 1 Tbsp  Margarine, 1 tsp  Salt, 1/4 tsp  Sugar, 2 tsp  Pineapple juice, 1/2 cup  Ground beef, 3 oz  Gravy, 2 Tbsp  Mashed potatoes, 1/2 cup  Cooked spinach, 1/2 cup  Applesauce, 1/2 cup  Decaffeinated coffee  Whole milk  Non-dairy creamer, 2 Tbsp  Margarine, 1 tsp  Salt, 1/4 tsp  Pureed [place], barley soup, 3/4 cup  Barbecue chicken, 3 oz  Mashed potatoes, 1/2 cup  Ground fresh broccoli, 1/2 cup  Frozen yogurt, 1/2 cup  Decaffeinated tea, 1 cup  Non-dairy creamer, 2 Tbsp  Margarine, 1 tsp  Salt, 1/4 tsp  Sugar, 1 tsp    LEVEL 4:  REGULAR FOODS  -Foods in this group are soft, moist, regularly textured foods.   -This level includes meat and breads, which tend to be the hardest things to swallow.   -Eat very slowly, chew well and continue to avoid carbonated drinks. -most people are at this level in 4-6 weeks  Hot Foods  Cold Foods  Baked fish or skinned Soft cheeses - cottage cheese  Souffles and omelets Cream cheese  Eggs Yogurt  Stuffed shells Milk  Spaghetti with meat sauce Milkshakes  Cooked cereal Cold dry cereals (no nuts, dried fruit, coconut)  [place] toast or pancakes Crackers  Buttered toast Fruit juices or nectars  Noodles or pasta (no rice) Canned fruit  Potatoes (all types) Ripe bananas  Soft, cooked vegetables (no corn, lima, or baked beans) Peeled, ripe, fresh fruit  Creamed soups or vegetable soup Cakes (no nuts, dried fruit, coconut)  Canned chicken noodle soup Plain doughnuts  Gravies Ice cream  Bacon dressing Pudding, mousse, custard  Sauces:  Cheese, creamed, barbecue, tomato, white Fruit ice, [place] ice, sherbet  Decaffeinated tea or coffee Whipped gelatin  Pork chops Regular gelatin   Canned fruited gelatin molds   Sugar, syrup, honey, jam, jelly   Cream   Non-dairy   Margarine   Oil   Mayonnaise   Ketchup   Mustard    If you have any questions please call our office at [place]: [phone].  LAPAROSCOPIC SURGERY: POST OP INSTRUCTIONS  1. DIET: Follow a light bland diet the first 24 hours after arrival home, such as soup, liquids, crackers, etc.  Be sure to include lots of fluids daily.  Avoid fast food or heavy meals as your are more likely to get nauseated.  Eat a low fat the next few days after surgery.   2. Take your usually prescribed home medications unless otherwise directed. 3. PAIN CONTROL: a. Pain is best controlled by a usual combination of three different methods TOGETHER: i. Ice/Heat ii. Over the counter pain medication iii. Prescription pain medication b. Most patients will experience some swelling and bruising around the incisions.  Ice packs or heating pads (30-60 minutes up to 6 times a day) will help. Use ice for the first few days to help decrease swelling and bruising, then switch to heat to help relax tight/sore spots and speed  recovery.  Some people prefer to use ice alone, heat alone, alternating between ice & heat.  Experiment to what  works for you.  Swelling and bruising can take several weeks to resolve.   c. It is helpful to take an over-the-counter pain medication regularly for the first few weeks.  Choose one of the following that works best for you: i. Naproxen (Aleve, etc)  Two '220mg'$  tabs twice a day ii. Ibuprofen (Advil, etc) Three '200mg'$  tabs four times a day (every meal & bedtime) iii. Acetaminophen (Tylenol, etc) 500-'650mg'$  four times a day (every meal & bedtime) d. A  prescription for pain medication (such as oxycodone, hydrocodone, etc) should be given to you upon discharge.  Take your pain medication as prescribed.  i. If you are having problems/concerns with the prescription medicine (does not control pain, nausea, vomiting, rash, itching, etc), please call us [phone] to see if we need to switch you to a different pain medicine that will work better for you and/or control your side effect better. ii. If you need a refill on your pain medication, please contact your pharmacy.  They will contact our office to request authorization. Prescriptions will not be filled after 5 pm or on week-ends. 4. Avoid getting constipated.  Between the surgery and the pain medications, it is common to experience some constipation.  Increasing fluid intake and taking a fiber supplement (such as Metamucil, Citrucel, FiberCon, MiraLax, etc) 1-2 times a day regularly will usually help prevent this problem from occurring.  A mild laxative (prune juice, Milk of Magnesia, MiraLax, etc) should be taken according to package directions if there are no bowel movements after 48 hours.   5. Watch out for diarrhea.  If you have many loose bowel movements, simplify your diet to bland foods & liquids for a few days.  Stop any stool softeners and decrease your fiber supplement.  Switching to mild anti-diarrheal medications (Kayopectate, Pepto  Bismol) can help.  If this worsens or does not improve, please call us. 6. Wash / shower every day.  You may shower over the dressings as they are waterproof.  Continue to shower over incision(s) after the dressing is off. 7. Remove your waterproof bandages 5 days after surgery.  You may leave the incision open to air.  You may replace a dressing/Band-Aid to cover the incision for comfort if you wish.  8. ACTIVITIES as tolerated:   a. You may resume regular (light) daily activities beginning the next day--such as daily self-care, walking, climbing stairs--gradually increasing activities as tolerated.  If you can walk 30 minutes without difficulty, it is safe to try more intense activity such as jogging, treadmill, bicycling, low-impact aerobics, swimming, etc. b. Save the most intensive and strenuous activity for last such as sit-ups, heavy lifting, contact sports, etc  Refrain from any heavy lifting or straining until you are off narcotics for pain control.   c. DO NOT PUSH THROUGH PAIN.  Let pain be your guide: If it hurts to do something, don't do it.  Pain is your body warning you to avoid that activity for another week until the pain goes down. d. You may drive when you are no longer taking prescription pain medication, you can comfortably wear a seatbelt, and you can safely maneuver your car and apply brakes. e. Dennis Bast may have sexual intercourse when it is comfortable.  9. FOLLOW UP in our office a. Please call [facility] at [phone] to set up an appointment to see your surgeon in the office for a follow-up appointment approximately 2-3 weeks after your surgery. b. Make sure that you call for  this appointment the day you arrive home to insure a convenient appointment time. 10. IF YOU HAVE DISABILITY OR FAMILY LEAVE FORMS, BRING THEM TO THE OFFICE FOR PROCESSING.  DO NOT GIVE THEM TO YOUR DOCTOR.   WHEN TO CALL us [phone]: 1. Poor pain control 2. Reactions / problems with new medications  (rash/itching, nausea, etc)  3. Fever over 101.5 F (38.5 C) 4. Inability to urinate 5. Nausea and/or vomiting 6. Worsening swelling or bruising 7. Continued bleeding from incision. 8. Increased pain, redness, or drainage from the incision   The clinic staff is available to answer your questions during regular business hours (8:30am-5pm).  Please dont hesitate to call and ask to speak to one of our nurses for clinical concerns.   If you have a medical emergency, go to the nearest emergency room or call 911.  A surgeon from [facility] is always on call at the [facility], [place], [place], [place], [place]  [address] ? MAIN: [phone] ? TOLL FREE: [phone] ?  FAX [phone] [email or web address]  GETTING TO GOOD BOWEL HEALTH. Irregular bowel habits such as constipation and diarrhea can lead to many problems over time.  Having one soft bowel movement a day is the most important way to prevent further problems.  The anorectal canal is designed to handle stretching and feces to safely manage our ability to get rid of solid waste (feces, poop, stool) out of our body.  BUT, hard constipated stools can act like ripping concrete bricks and diarrhea can be a burning fire to this very sensitive area of our body, causing inflamed hemorrhoids, anal fissures, increasing risk is perirectal abscesses, abdominal pain/bloating, an making irritable bowel worse.     The goal: ONE SOFT BOWEL MOVEMENT A DAY!  To have soft, regular bowel movements:    Drink at least 8 tall glasses of water a day.     Take plenty of fiber.  Fiber is the undigested part of plant food that passes into the colon, acting s natures broom to encourage bowel motility and movement.  Fiber can absorb and hold large amounts of water. This results in a larger, bulkier stool, which is soft and easier to pass. Work gradually over several weeks up to 6 servings a day  of fiber (25g a day even more if needed) in the form of: o Vegetables -- Root (potatoes, carrots, turnips), leafy green (lettuce, salad greens, celery, spinach), or cooked high residue (cabbage, broccoli, etc) o Fruit -- Fresh (unpeeled skin & pulp), Dried (prunes, apricots, cherries, etc ),  or stewed ( applesauce)  o Whole grain breads, pasta, etc (whole wheat)  o Bran cereals    Bulking Agents -- This type of water-retaining fiber generally is easily obtained each day by one of the following:  o Psyllium bran -- The psyllium plant is remarkable because its ground seeds can retain so much water. This product is available as Metamucil, Konsyl, Effersyllium, Per Diem Fiber, or the less expensive generic preparation in drug and health food stores. Although labeled a laxative, it really is not a laxative.  o Methylcellulose -- This is another fiber derived from wood which also retains water. It is available as Citrucel. o Polyethylene Glycol - and artificial fiber commonly called Miralax or Glycolax.  It is helpful for people with gassy or bloated feelings with regular fiber o Flax Seed - a less gassy fiber than psyllium   No reading  or other relaxing activity while on the toilet. If bowel movements take longer than 5 minutes, you are too constipated   AVOID CONSTIPATION.  High fiber and water intake usually takes care of this.  Sometimes a laxative is needed to stimulate more frequent bowel movements, but    Laxatives are not a good long-term solution as it can wear the colon out. o Osmotics (Milk of Magnesia, Fleets phosphosoda, Magnesium citrate, MiraLax, GoLytely) are safer than  o Stimulants (Senokot, Castor Oil, Dulcolax, Ex Lax)    o Do not take laxatives for more than 7days in a row.    IF SEVERELY CONSTIPATED, try a Bowel Retraining Program: o Do not use laxatives.  o Eat a diet high in roughage, such as bran cereals and leafy vegetables.  o Drink six (6) ounces of prune or apricot juice  each morning.  o Eat two (2) large servings of stewed fruit each day.  o Take one (1) heaping tablespoon of a psyllium-based bulking agent twice a day. Use sugar-free sweetener when possible to avoid excessive calories.  o Eat a normal breakfast.  o Set aside 15 minutes after breakfast to sit on the toilet, but do not strain to have a bowel movement.  o If you do not have a bowel movement by the third day, use an enema and repeat the above steps.    Controlling diarrhea o Switch to liquids and simpler foods for a few days to avoid stressing your intestines further. o Avoid dairy products (especially milk & ice cream) for a short time.  The intestines often can lose the ability to digest lactose when stressed. o Avoid foods that cause gassiness or bloating.  Typical foods include beans and other legumes, cabbage, broccoli, and dairy foods.  Every person has some sensitivity to other foods, so listen to our body and avoid those foods that trigger problems for you. o Adding fiber (Citrucel, Metamucil, psyllium, Miralax) gradually can help thicken stools by absorbing excess fluid and retrain the intestines to act more normally.  Slowly increase the dose over a few weeks.  Too much fiber too soon can backfire and cause cramping & bloating. o Probiotics (such as active yogurt, Align, etc) may help repopulate the intestines and colon with normal bacteria and calm down a sensitive digestive tract.  Most studies show it to be of mild help, though, and such products can be costly. o Medicines:   Bismuth subsalicylate (ex. Kayopectate, Pepto Bismol) every 30 minutes for up to 6 doses can help control diarrhea.  Avoid if pregnant.   Loperamide (Immodium) can slow down diarrhea.  Start with two tablets (4mg  total) first and then try one tablet every 6 hours.  Avoid if you are having fevers or severe pain.  If you are not better or start feeling worse, stop all medicines and call your doctor for advice o Call your  doctor if you are getting worse or not better.  Sometimes further testing (cultures, endoscopy, X-ray studies, bloodwork, etc) may be needed to help diagnose and treat the cause of the diarrhea.  Managing Pain  Pain after surgery or related to activity is often due to strain/injury to muscle, tendon, nerves and/or incisions.  This pain is usually short-term and will improve in a few months.   Many people find it helpful to do the following things TOGETHER to help speed the process of healing and to get back to regular activity more quickly:  1. Avoid heavy physical activity a.  no lifting greater than 20 pounds b. Do not push through the pain.  Listen to your body and avoid positions and maneuvers than reproduce the pain c. Walking is okay as tolerated, but go slowly and stop when getting sore.  d. Remember: If it hurts to do it, then dont do it! 2. Take Anti-inflammatory medication  a. Take with food/snack around the clock for 1-2 weeks i. This helps the muscle and nerve tissues become less irritable and calm down faster b. Choose ONE of the following over-the-counter medications: i. Naproxen 220mg  tabs (ex. Aleve) 1-2 pills twice a day  ii. Ibuprofen 200mg  tabs (ex. Advil, Motrin) 3-4 pills with every meal and just before bedtime iii. Acetaminophen 500mg  tabs (Tylenol) 1-2 pills with every meal and just before bedtime 3. Use a Heating pad or Ice/Cold Pack a. 4-6 times a day b. May use warm bath/hottub  or showers 4. Try Gentle Massage and/or Stretching  a. at the area of pain many times a day b. stop if you feel pain - do not overdo it  Try these steps together to help you body heal faster and avoid making things get worse.  Doing just one of these things may not be enough.    If you are not getting better after two weeks or are noticing you are getting worse, contact our office for further advice; we may need to re-evaluate you & see what other things we can do to help.  Hiatal  Hernia A hiatal hernia occurs when part of your stomach slides above the muscle that separates your abdomen from your chest (diaphragm). You can be born with a hiatal hernia (congenital), or it may develop over time. In almost all cases of hiatal hernia, only the top part of the stomach pushes through.  Many people have a hiatal hernia with no symptoms. The larger the hernia, the more likely that you will have symptoms. In some cases, a hiatal hernia allows stomach acid to flow back into the tube that carries food from your mouth to your stomach (esophagus). This may cause heartburn symptoms. Severe heartburn symptoms may mean you have developed a condition called gastroesophageal reflux disease (GERD).  CAUSES  Hiatal hernias are caused by a weakness in the opening (hiatus) where your esophagus passes through your diaphragm to attach to the upper part of your stomach. You may be born with a weakness in your hiatus, or a weakness can develop. RISK FACTORS Older age is a major risk factor for a hiatal hernia. Anything that increases pressure on your diaphragm can also increase your risk of a hiatal hernia. This includes:  Pregnancy.  Excess weight.  Frequent constipation. SIGNS AND SYMPTOMS  People with a hiatal hernia often have no symptoms. If symptoms develop, they are almost always caused by GERD. They may include:  Heartburn.  Belching.  Indigestion.  Trouble swallowing.  Coughing or wheezing.  Sore throat.  Hoarseness.  Chest pain. DIAGNOSIS  A hiatal hernia is sometimes found during an exam for another problem. Your health care provider may suspect a hiatal hernia if you have symptoms of GERD. Tests may be done to diagnose GERD. These may include:  X-rays of your stomach or chest.  An upper gastrointestinal (GI) series. This is an X-ray exam of your GI tract involving the use of a chalky liquid that you swallow. The liquid shows up clearly on the X-ray.  Endoscopy. This  is a procedure to look into your stomach using a thin,  flexible tube that has a tiny camera and light on the end of it. TREATMENT  If you have no symptoms, you may not need treatment. If you have symptoms, treatment may include:  Dietary and lifestyle changes to help reduce GERD symptoms.  Medicines. These may include:  Over-the-counter antacids.  Medicines that make your stomach empty more quickly.  Medicines that block the production of stomach acid (H2 blockers).  Stronger medicines to reduce stomach acid (proton pump inhibitors).  You may need surgery to repair the hernia if other treatments are not helping. HOME CARE INSTRUCTIONS   Take all medicines as directed by your health care provider.  Quit smoking, if you smoke.  Try to achieve and maintain a healthy body weight.  Eat frequent small meals instead of three large meals a day. This keeps your stomach from getting too full.  Eat slowly.  Do not lie down right after eating.  Do noteat 1-2 hours before bed.   Do not drink beverages with caffeine. These include cola, coffee, cocoa, and tea.  Do not drink alcohol.  Avoid foods that can make symptoms of GERD worse. These may include:  Fatty foods.  Citrus fruits.  Other foods and drinks that contain acid.  Avoid putting pressure on your belly. Anything that puts pressure on your belly increases the amount of acid that may be pushed up into your esophagus.   Avoid bending over, especially after eating.  Raise the head of your bed by putting blocks under the legs. This keeps your head and esophagus higher than your stomach.  Do not wear tight clothing around your chest or stomach.  Try not to strain when having a bowel movement, when urinating, or when lifting heavy objects. SEEK MEDICAL CARE IF:  Your symptoms are not controlled with medicines or lifestyle changes.  You are having trouble swallowing.  You have coughing or wheezing that will not go  away. SEEK IMMEDIATE MEDICAL CARE IF:  Your pain is getting worse.  Your pain spreads to your arms, neck, jaw, teeth, or back.  You have shortness of breath.  You sweat for no reason.  You feel sick to your stomach (nauseous) or vomit.  You vomit blood.  You have bright red blood in your stools.  You have black, tarry stools.  Document Released: 12/28/2003 Document Revised: 02/21/2014 Document Reviewed: 09/24/2013 [facility] Patient Information 2015 [place], [place]. This information is not intended to replace advice given to you by your health care provider. Make sure you discuss any questions you have with your health care provider.

## 2014-08-01 ENCOUNTER — Encounter (HOSPITAL_COMMUNITY): Payer: Self-pay | Admitting: Surgery

## 2014-08-02 ENCOUNTER — Ambulatory Visit: Payer: BC Managed Care – PPO | Admitting: Internal Medicine

## 2015-05-18 ENCOUNTER — Encounter: Payer: Self-pay | Admitting: Gastroenterology

## 2015-09-26 ENCOUNTER — Other Ambulatory Visit: Payer: Self-pay | Admitting: Dermatology

## 2015-11-14 ENCOUNTER — Encounter: Payer: Self-pay | Admitting: Cardiovascular Disease

## 2015-11-14 ENCOUNTER — Ambulatory Visit (INDEPENDENT_AMBULATORY_CARE_PROVIDER_SITE_OTHER): Payer: BLUE CROSS/BLUE SHIELD | Admitting: Cardiovascular Disease

## 2015-11-14 VITALS — BP 110/72 | HR 86 | Ht 69.0 in | Wt 157.0 lb

## 2015-11-14 DIAGNOSIS — I2510 Atherosclerotic heart disease of native coronary artery without angina pectoris: Secondary | ICD-10-CM

## 2015-11-14 DIAGNOSIS — I10 Essential (primary) hypertension: Secondary | ICD-10-CM

## 2015-11-14 DIAGNOSIS — I447 Left bundle-branch block, unspecified: Secondary | ICD-10-CM

## 2015-11-14 DIAGNOSIS — Z01818 Encounter for other preprocedural examination: Secondary | ICD-10-CM | POA: Diagnosis not present

## 2015-11-14 NOTE — Patient Instructions (Signed)
Continue all current medications. Your physician wants you to follow up in:  2 years.  You will receive a reminder letter in the mail one-two months in advance.  If you don't receive a letter, please call our office to schedule the follow up appointment

## 2015-11-14 NOTE — Progress Notes (Signed)
Patient ID: [name], female   DOB: 25-Oct-1950, 65 y.o.   MRN: [id number]      SUBJECTIVE: The patient is a 65 year old woman was most recently evaluated by Dr. Harl Bowie on 06/15/14. She reportedly has a history of coronary artery calcifications seen on CT scan. She reportedly had a normal nuclear stress test in 2012 and has a history of a left bundle branch block. She has a history of GERD as well.  She has been doing well and denies chest pain , palpitations, shortness of breath, and leg swelling. She cut her right hand during Christmas time and needs to undergo hand surgery with tendon repair.   ECG performed in the office today demonstrates sinus rhythm with left bundle-branch block.  Review of Systems: As per "subjective", otherwise negative.  Allergies  Allergen Reactions  . Alendronate Sodium Other (See Comments)    Irritates GERD  . Ciprofloxacin     REACTION: rash, hives  . Codeine Nausea Only    REACTION: NAUSEA  . Tetracycline     REACTION: rash, hives    Current Outpatient Prescriptions  Medication Sig Dispense Refill  . acetaminophen (TYLENOL) 325 MG tablet Take 325 mg by mouth at bedtime.     Marland Kitchen aspirin 81 MG tablet Take 81 mg by mouth every morning.     . Calcium Carbonate-Vitamin D (CALCIUM 600 + D PO) Take 1 tablet by mouth daily at 12 noon.     . diphenhydrAMINE (BENADRYL) 25 mg capsule Take 25 mg by mouth every 6 (six) hours as needed. For sinus drainage    . famotidine (PEPCID) 20 MG tablet Take 20 mg by mouth 3 (three) times daily.     . hydrochlorothiazide (HYDRODIURIL) 25 MG tablet Take 25 mg by mouth every morning.    . hyoscyamine (LEVSIN SL) 0.125 MG SL tablet Place 0.125 mg under the tongue every 6 (six) hours as needed for cramping.     Marland Kitchen levothyroxine (SYNTHROID, LEVOTHROID) 88 MCG tablet Take 88 mcg by mouth every morning.     . Multiple Vitamin (MULTIVITAMIN) tablet Take 1 tablet by mouth daily at 12 noon.     . Probiotic Product (ALIGN) 4 MG CAPS  Take 1 capsule by mouth every morning.     . raloxifene (EVISTA) 60 MG tablet Take 60 mg by mouth every morning.     . traMADol (ULTRAM) 50 MG tablet Take 1 tablet (50 mg total) by mouth every 6 (six) hours as needed for moderate pain. 50 tablet 0  . valACYclovir (VALTREX) 1000 MG tablet Take 1,000 mg by mouth every morning.      No current facility-administered medications for this visit.    Past Medical History  Diagnosis Date  . Hypertension   . Barrett's esophagus   . IBS (irritable bowel syndrome)   . GERD (gastroesophageal reflux disease)   . Hiatal hernia   . Thyroid disease   . Diverticulosis of colon (without mention of hemorrhage)   . Hypothyroidism   . Cancer ([place])     hx of skin cancer     Past Surgical History  Procedure Laterality Date  . Total vaginal hysterectomy    . Colonoscopy  2009    562.10  . Tmj arthroplasty    . Trigger finger release    . Nissen fundoplication  [id number]  . Appendectomy    . Skin cancer removal from right thigh     . Laparoscopic nissen fundoplication N/A [id number]    Procedure:  REDO NISSEN FUNDOPLICATION WITH UPPER ENDOSCOPY;  Surgeon: Kaylyn Lim, MD;  Location: [facility];  Service: General;  Laterality: N/A;    Social History   Social History  . Marital Status: Married    Spouse Name: N/A  . Number of Children: N/A  . Years of Education: N/A   Occupational History  . Not on file.   Social History Main Topics  . Smoking status: Never Smoker   . Smokeless tobacco: Never Used  . Alcohol Use: No  . Drug Use: No  . Sexual Activity: Not on file   Other Topics Concern  . Not on file   Social History Narrative     Filed Vitals:   11/14/15 1603  BP: 110/72  Pulse: 86  Height: 5\' 9"  (1.753 m)  Weight: 157 lb (71.215 kg)  SpO2: 97%    PHYSICAL EXAM General: NAD HEENT: Normal. Neck: No JVD, no thyromegaly. Lungs: Clear to auscultation bilaterally with normal respiratory effort. CV: Nondisplaced PMI.  Regular rate and  rhythm, normal S1/S2, no S3/S4, no murmur. No pretibial or periankle edema.  No carotid bruit.  Normal pedal pulses.  Abdomen: Soft, nontender, no hepatosplenomegaly, no distention.  Neurologic: Alert and oriented x 3.  Psych: Normal affect. Skin: Normal. Musculoskeletal: Normal range of motion, no gross deformities. Extremities: No clubbing or cyanosis.   ECG: Most recent ECG reviewed.      ASSESSMENT AND PLAN: 1. CAD: Symptomatically stable.  Has chronic left bundle branch block with prior nuclear stress test which was normal. No indication for noninvasive testing at this time.  2. Preoperative risk stratification: Given her normal exercise tolerance and symptoms stability over the past several years, I feel she is at low risk for a major adverse cardiac event in the perioperative period. No indication for noninvasive testing at this time.  Dispo: f/u 2 years with Dr. Neldon Labella, M.D., F.A.C.C.

## 2015-11-16 ENCOUNTER — Other Ambulatory Visit: Payer: Self-pay | Admitting: Orthopedic Surgery

## 2015-11-20 ENCOUNTER — Encounter (HOSPITAL_BASED_OUTPATIENT_CLINIC_OR_DEPARTMENT_OTHER): Payer: Self-pay | Admitting: *Deleted

## 2015-11-21 ENCOUNTER — Ambulatory Visit (INDEPENDENT_AMBULATORY_CARE_PROVIDER_SITE_OTHER): Payer: BLUE CROSS/BLUE SHIELD | Admitting: Internal Medicine

## 2015-11-21 ENCOUNTER — Encounter: Payer: Self-pay | Admitting: Internal Medicine

## 2015-11-21 VITALS — BP 126/76 | HR 72 | Ht 63.0 in

## 2015-11-21 DIAGNOSIS — K2270 Barrett's esophagus without dysplasia: Secondary | ICD-10-CM | POA: Diagnosis not present

## 2015-11-21 DIAGNOSIS — K580 Irritable bowel syndrome with diarrhea: Secondary | ICD-10-CM | POA: Diagnosis not present

## 2015-11-21 DIAGNOSIS — R195 Other fecal abnormalities: Secondary | ICD-10-CM

## 2015-11-21 DIAGNOSIS — R1032 Left lower quadrant pain: Secondary | ICD-10-CM

## 2015-11-21 DIAGNOSIS — R1031 Right lower quadrant pain: Secondary | ICD-10-CM | POA: Diagnosis not present

## 2015-11-21 MED ORDER — RIFAXIMIN 550 MG PO TABS: 550.0000 mg | ORAL_TABLET | Freq: Three times a day (TID) | ORAL | Status: DC

## 2015-11-21 NOTE — Patient Instructions (Signed)
We have sent your demographic information and a prescription for Xifaxan to [facility]. This pharmacy is able to get medication approved through insurance and get you the lowest copay possible. If you have not heard from them within 1 week, please call our office at [phone] to let us know.  After taking your Xifaxan, start Align (over the counter) once daily.  Follow up with Dr Hilarie Fredrickson in 3 months. Call our office if you do not improve after the Xifaxan  We may need to perform a colonoscopy.

## 2015-11-21 NOTE — Progress Notes (Signed)
Subjective:    Patient ID: [name], female    DOB: 1951/01/28, 65 y.o.   MRN: [id number]  HPI [name] is a 65 year old female with a past medical history of GERD, Barrett's esophagus status post Nissen fundoplication in [id number], left upper quadrant abdominal pain evaluated in 2015 which led to redo Nissen fundoplication by Dr. Hassell Done and Dr. Lucia Gaskins, history of diverticulosis and IBS who seen for follow-up. She is here alone today.  She reports the epigastric and left upper quadrant abdominal pain with bloating and belching which was evaluated nearly 2 years ago resolved entirely and almost instantly after redo Nissen fundoplication. At the time of her last endoscopy in August 2015 she did have very slight and short segment Barrett's esophagus. She denies issues with heartburn. No epigastric pain. No nausea or vomiting.  New for her is episodic somewhat sudden onset lower abdominal cramping pain followed by soft to loose stools. This occurs over a 1-4 hour period and can be significant pain for her causing her to leave work. No definite trigger. Does not feel like it definitely relates to diet. No bleeding or light in her stool. No melena. Pain does not radiate and is cramping in nature. If the cramping is severe enough it can trigger nausea but not vomiting. She tries Levsin for the pain which helps some. This has occurred maybe once every 3-4 weeks over the last 4-5 months. It last happened Sunday which was 2 days ago. In between episodes her bowel movements are daily formed and regular. She does use align as a probiotic daily. She is on Pepcid 2 times daily  Her last colonoscopy was performed by Verl Blalock on 09/05/2008. This showed a redundant and tortuous colon with diverticulosis in the left side. Exam was to the cecum with excellent prep. No polyps were seen. There is no family history of colon cancer  Review of Systems As per history of present illness, otherwise  negative  Current Medications, Allergies, Past Medical History, Past Surgical History, Family History and Social History were reviewed in [employer] record.     Objective:   Physical Exam BP 126/76 mmHg  Pulse 72  Ht 5\' 3"  (1.6 m) Constitutional: Well-developed and well-nourished. No distress. HEENT: Normocephalic and atraumatic. Oropharynx is clear and moist. No oropharyngeal exudate. Conjunctivae are normal.  No scleral icterus. Neck: Neck supple. Trachea midline. Cardiovascular: Normal rate, regular rhythm and intact distal pulses. No M/R/G Pulmonary/chest: Effort normal and breath sounds normal. No wheezing, rales or rhonchi. Abdominal: Soft, nontender, nondistended. Bowel sounds active throughout. There are no masses palpable. No hepatosplenomegaly. Extremities: no clubbing, cyanosis, or edema Neurological: Alert and oriented to person place and time. Skin: Skin is warm and dry. No rashes noted. Psychiatric: Normal mood and affect. Behavior is normal.      Assessment & Plan:   65 year old female with a past medical history of GERD, Barrett's esophagus status post Nissen fundoplication in [id number], left upper quadrant abdominal pain evaluated in 2015 which led to redo Nissen fundoplication by Dr. Hassell Done and Dr. Lucia Gaskins, history of diverticulosis and IBS who seen for follow-up to evaluate crampy lower abdominal pain and loose frequent stools during episodes  1. Episodic lower abdominal pain with frequent loose stools -- pain seems to be related to colonic spasm and she does have a history of irritable bowel. In between episodes she feels well. She's had no blood in her stool or other alarm symptoms. She does have a history of  diverticulosis and tortuous left colon which may be precipitating attacks. These attacks are not consistent with diverticulitis. We discussed evaluation and she would prefer medication trial first. If no improvement my recommendation is for  colonoscopy. Trial of rifaximin 550 mg 3 times a day 14 days for irritable bowel with diarrhea. Resume align after rifaximin therapy. Follow-up in 2-3 months, sooner if necessary. If no benefit or worsening prior to follow-up she is asked to notify me. She voices understanding. She can continue Levsin on an as-needed basis as directed.  2. Barrett's esophagus -- short segment without dysplasia. Surveillance endoscopy recommended 2018.  25 minutes spent with the patient today. Greater than 50% was spent in counseling and coordination of care with the patient

## 2015-11-22 ENCOUNTER — Encounter (HOSPITAL_COMMUNITY)
Admission: RE | Admit: 2015-11-22 | Discharge: 2015-11-22 | Disposition: A | Discharge status: Home / Self Care | Payer: BLUE CROSS/BLUE SHIELD | Source: Ambulatory Visit | Attending: Orthopedic Surgery | Admitting: Orthopedic Surgery

## 2015-11-22 DIAGNOSIS — K580 Irritable bowel syndrome with diarrhea: Secondary | ICD-10-CM | POA: Diagnosis not present

## 2015-11-22 DIAGNOSIS — W260XXA Contact with knife, initial encounter: Secondary | ICD-10-CM | POA: Diagnosis not present

## 2015-11-22 DIAGNOSIS — Z79899 Other long term (current) drug therapy: Secondary | ICD-10-CM | POA: Diagnosis not present

## 2015-11-22 DIAGNOSIS — Z79810 Long term (current) use of selective estrogen receptor modulators (SERMs): Secondary | ICD-10-CM | POA: Diagnosis not present

## 2015-11-22 DIAGNOSIS — I10 Essential (primary) hypertension: Secondary | ICD-10-CM | POA: Diagnosis not present

## 2015-11-22 DIAGNOSIS — S66126A Laceration of flexor muscle, fascia and tendon of right little finger at wrist and hand level, initial encounter: Secondary | ICD-10-CM | POA: Diagnosis present

## 2015-11-22 DIAGNOSIS — Z85828 Personal history of other malignant neoplasm of skin: Secondary | ICD-10-CM | POA: Diagnosis not present

## 2015-11-22 DIAGNOSIS — Z7982 Long term (current) use of aspirin: Secondary | ICD-10-CM | POA: Diagnosis not present

## 2015-11-22 DIAGNOSIS — K219 Gastro-esophageal reflux disease without esophagitis: Secondary | ICD-10-CM | POA: Diagnosis not present

## 2015-11-22 DIAGNOSIS — E039 Hypothyroidism, unspecified: Secondary | ICD-10-CM | POA: Diagnosis not present

## 2015-11-22 LAB — BASIC METABOLIC PANEL
Anion gap: 8 (ref 5–15)
BUN: 13 mg/dL (ref 6–20)
CO2: 29 mmol/L (ref 22–32)
Calcium: 9 mg/dL (ref 8.9–10.3)
Chloride: 104 mmol/L (ref 101–111)
Creatinine, Ser: 0.69 mg/dL (ref 0.44–1.00)
GFR calc Af Amer: >60 mL/min (ref >60)
GFR calc non Af Amer: >60 mL/min (ref >60)
Glucose, Bld: 86 mg/dL (ref 65–99)
Potassium: 4 mmol/L (ref 3.5–5.1)
Sodium: 141 mmol/L (ref 135–145)

## 2015-11-23 ENCOUNTER — Encounter (HOSPITAL_BASED_OUTPATIENT_CLINIC_OR_DEPARTMENT_OTHER)
Admission: RE | Disposition: A | Discharge status: Home / Self Care | Payer: Self-pay | Source: Ambulatory Visit | Attending: Orthopedic Surgery

## 2015-11-23 ENCOUNTER — Encounter (HOSPITAL_BASED_OUTPATIENT_CLINIC_OR_DEPARTMENT_OTHER): Payer: Self-pay

## 2015-11-23 ENCOUNTER — Ambulatory Visit (HOSPITAL_BASED_OUTPATIENT_CLINIC_OR_DEPARTMENT_OTHER): Payer: BLUE CROSS/BLUE SHIELD | Admitting: Certified Registered"

## 2015-11-23 ENCOUNTER — Ambulatory Visit (HOSPITAL_BASED_OUTPATIENT_CLINIC_OR_DEPARTMENT_OTHER)
Admission: RE | Admit: 2015-11-23 | Discharge: 2015-11-23 | Disposition: A | Discharge status: Home / Self Care | Payer: BLUE CROSS/BLUE SHIELD | Source: Ambulatory Visit | Attending: Orthopedic Surgery | Admitting: Orthopedic Surgery

## 2015-11-23 DIAGNOSIS — E039 Hypothyroidism, unspecified: Secondary | ICD-10-CM | POA: Insufficient documentation

## 2015-11-23 DIAGNOSIS — K580 Irritable bowel syndrome with diarrhea: Secondary | ICD-10-CM | POA: Insufficient documentation

## 2015-11-23 DIAGNOSIS — Z79899 Other long term (current) drug therapy: Secondary | ICD-10-CM | POA: Insufficient documentation

## 2015-11-23 DIAGNOSIS — Z79810 Long term (current) use of selective estrogen receptor modulators (SERMs): Secondary | ICD-10-CM | POA: Insufficient documentation

## 2015-11-23 DIAGNOSIS — S66126A Laceration of flexor muscle, fascia and tendon of right little finger at wrist and hand level, initial encounter: Secondary | ICD-10-CM | POA: Insufficient documentation

## 2015-11-23 DIAGNOSIS — I10 Essential (primary) hypertension: Secondary | ICD-10-CM | POA: Insufficient documentation

## 2015-11-23 DIAGNOSIS — Z85828 Personal history of other malignant neoplasm of skin: Secondary | ICD-10-CM | POA: Insufficient documentation

## 2015-11-23 DIAGNOSIS — Z7982 Long term (current) use of aspirin: Secondary | ICD-10-CM | POA: Insufficient documentation

## 2015-11-23 DIAGNOSIS — W260XXA Contact with knife, initial encounter: Secondary | ICD-10-CM | POA: Insufficient documentation

## 2015-11-23 DIAGNOSIS — K219 Gastro-esophageal reflux disease without esophagitis: Secondary | ICD-10-CM | POA: Insufficient documentation

## 2015-11-23 HISTORY — PX: FLEXOR TENDON REPAIR: SHX6501

## 2015-11-23 SURGERY — REPAIR, TENDON, FLEXOR
Anesthesia: General | Site: Finger | Laterality: Right

## 2015-11-23 MED ORDER — LACTATED RINGERS IV SOLN
INTRAVENOUS | Status: DC
  Administered 2015-11-23: 12:00:00 via INTRAVENOUS

## 2015-11-23 MED ORDER — PROMETHAZINE HCL 25 MG/ML IJ SOLN: 6.2500 mg | INTRAMUSCULAR | Status: DC | PRN

## 2015-11-23 MED ORDER — MEPERIDINE HCL 25 MG/ML IJ SOLN: 6.2500 mg | INTRAMUSCULAR | Status: DC | PRN

## 2015-11-23 MED ORDER — FENTANYL CITRATE (PF) 100 MCG/2ML IJ SOLN
INTRAMUSCULAR | Status: AC
  Filled 2015-11-23: qty 2

## 2015-11-23 MED ORDER — MIDAZOLAM HCL 2 MG/2ML IJ SOLN
1.0000 mg | INTRAMUSCULAR | Status: DC | PRN
  Administered 2015-11-23 (×2): 1 mg via INTRAVENOUS

## 2015-11-23 MED ORDER — DEXAMETHASONE SODIUM PHOSPHATE 10 MG/ML IJ SOLN
INTRAMUSCULAR | Status: DC | PRN
  Administered 2015-11-23: 10 mg via INTRAVENOUS

## 2015-11-23 MED ORDER — MEPERIDINE HCL 50 MG PO TABS: 50.0000 mg | ORAL_TABLET | ORAL | Status: DC | PRN

## 2015-11-23 MED ORDER — CHLORHEXIDINE GLUCONATE 4 % EX LIQD: 60.0000 mL | Freq: Once | CUTANEOUS | Status: DC

## 2015-11-23 MED ORDER — BUPIVACAINE-EPINEPHRINE (PF) 0.5% -1:200000 IJ SOLN
INTRAMUSCULAR | Status: DC | PRN
  Administered 2015-11-23: 30 mL via PERINEURAL

## 2015-11-23 MED ORDER — CEFAZOLIN SODIUM-DEXTROSE 2-3 GM-% IV SOLR
2.0000 g | INTRAVENOUS | Status: AC
  Administered 2015-11-23: 2 g via INTRAVENOUS

## 2015-11-23 MED ORDER — CEFAZOLIN SODIUM-DEXTROSE 2-3 GM-% IV SOLR
INTRAVENOUS | Status: AC
  Filled 2015-11-23: qty 50

## 2015-11-23 MED ORDER — LACTATED RINGERS IV SOLN
INTRAVENOUS | Status: DC
Start: 2015-11-23 — End: 2015-11-23

## 2015-11-23 MED ORDER — LIDOCAINE HCL (CARDIAC) 20 MG/ML IV SOLN
INTRAVENOUS | Status: DC | PRN
  Administered 2015-11-23: 50 mg via INTRAVENOUS

## 2015-11-23 MED ORDER — CHLORHEXIDINE GLUCONATE 4 % EX LIQD
60.0000 mL | Freq: Once | CUTANEOUS | Status: DC
Start: 2015-11-23 — End: 2015-11-23

## 2015-11-23 MED ORDER — ONDANSETRON HCL 4 MG/2ML IJ SOLN
INTRAMUSCULAR | Status: DC | PRN
  Administered 2015-11-23: 4 mg via INTRAVENOUS

## 2015-11-23 MED ORDER — ONDANSETRON HCL 4 MG/2ML IJ SOLN
INTRAMUSCULAR | Status: AC
  Filled 2015-11-23: qty 2

## 2015-11-23 MED ORDER — CEFAZOLIN SODIUM-DEXTROSE 2-3 GM-% IV SOLR: 2.0000 g | INTRAVENOUS | Status: DC

## 2015-11-23 MED ORDER — MIDAZOLAM HCL 2 MG/2ML IJ SOLN
INTRAMUSCULAR | Status: AC
  Filled 2015-11-23: qty 2

## 2015-11-23 MED ORDER — BUPIVACAINE HCL (PF) 0.25 % IJ SOLN
INTRAMUSCULAR | Status: DC | PRN
  Administered 2015-11-23: 7 mL

## 2015-11-23 MED ORDER — HYDROMORPHONE HCL 1 MG/ML IJ SOLN: 0.2500 mg | INTRAMUSCULAR | Status: DC | PRN

## 2015-11-23 MED ORDER — SUCCINYLCHOLINE CHLORIDE 20 MG/ML IJ SOLN
INTRAMUSCULAR | Status: AC
  Filled 2015-11-23: qty 1

## 2015-11-23 MED ORDER — GLYCOPYRROLATE 0.2 MG/ML IJ SOLN: 0.2000 mg | Freq: Once | INTRAMUSCULAR | Status: DC | PRN

## 2015-11-23 MED ORDER — PROPOFOL 10 MG/ML IV BOLUS
INTRAVENOUS | Status: DC | PRN
  Administered 2015-11-23: 300 mg via INTRAVENOUS

## 2015-11-23 MED ORDER — SCOPOLAMINE 1 MG/3DAYS TD PT72: 1.0000 | MEDICATED_PATCH | Freq: Once | TRANSDERMAL | Status: DC | PRN

## 2015-11-23 MED ORDER — DEXAMETHASONE SODIUM PHOSPHATE 10 MG/ML IJ SOLN
INTRAMUSCULAR | Status: AC
  Filled 2015-11-23: qty 1

## 2015-11-23 MED ORDER — FENTANYL CITRATE (PF) 100 MCG/2ML IJ SOLN
50.0000 ug | INTRAMUSCULAR | Status: AC | PRN
  Administered 2015-11-23 (×2): 25 ug via INTRAVENOUS
  Administered 2015-11-23 (×2): 50 ug via INTRAVENOUS

## 2015-11-23 MED ORDER — LIDOCAINE HCL (CARDIAC) 20 MG/ML IV SOLN
INTRAVENOUS | Status: AC
  Filled 2015-11-23: qty 5

## 2015-11-23 SURGICAL SUPPLY — 76 items
BAG DECANTER FOR FLEXI CONT (MISCELLANEOUS) IMPLANT
BLADE MINI RND TIP GREEN BEAV (BLADE) IMPLANT
BLADE SURG 15 STRL LF DISP TIS (BLADE) ×2 IMPLANT
BLADE SURG 15 STRL SS (BLADE) ×2
BNDG COHESIVE 3X5 TAN STRL LF (GAUZE/BANDAGES/DRESSINGS) ×2 IMPLANT
BNDG CONFORM 2 STRL LF (GAUZE/BANDAGES/DRESSINGS) IMPLANT
BNDG ESMARK 4X9 LF (GAUZE/BANDAGES/DRESSINGS) ×2 IMPLANT
BNDG GAUZE ELAST 4 BULKY (GAUZE/BANDAGES/DRESSINGS) IMPLANT
CHLORAPREP W/TINT 26ML (MISCELLANEOUS) ×2 IMPLANT
CORDS BIPOLAR (ELECTRODE) ×2 IMPLANT
COTTONBALL LRG STERILE PKG (GAUZE/BANDAGES/DRESSINGS) IMPLANT
COVER BACK TABLE 60X90IN (DRAPES) ×2 IMPLANT
COVER MAYO STAND STRL (DRAPES) ×2 IMPLANT
CUFF TOURNIQUET SINGLE 18IN (TOURNIQUET CUFF) ×2 IMPLANT
DECANTER SPIKE VIAL GLASS SM (MISCELLANEOUS) IMPLANT
DRAIN TLS ROUND 10FR (DRAIN) IMPLANT
DRAPE EXTREMITY T 121X128X90 (DRAPE) ×2 IMPLANT
DRAPE SURG 17X23 STRL (DRAPES) ×2 IMPLANT
DRSG PAD ABDOMINAL 8X10 ST (GAUZE/BANDAGES/DRESSINGS) IMPLANT
GAUZE SPONGE 4X4 12PLY STRL (GAUZE/BANDAGES/DRESSINGS) ×2 IMPLANT
GAUZE SPONGE 4X4 16PLY XRAY LF (GAUZE/BANDAGES/DRESSINGS) IMPLANT
GAUZE XEROFORM 1X8 LF (GAUZE/BANDAGES/DRESSINGS) ×2 IMPLANT
GLOVE BIO SURGEON STRL SZ 6.5 (GLOVE) ×2 IMPLANT
GLOVE BIO SURGEON STRL SZ7.5 (GLOVE) ×2 IMPLANT
GLOVE BIOGEL PI IND STRL 7.0 (GLOVE) ×1 IMPLANT
GLOVE BIOGEL PI IND STRL 8 (GLOVE) ×2 IMPLANT
GLOVE BIOGEL PI IND STRL 8.5 (GLOVE) ×1 IMPLANT
GLOVE BIOGEL PI INDICATOR 7.0 (GLOVE) ×1
GLOVE BIOGEL PI INDICATOR 8 (GLOVE) ×2
GLOVE BIOGEL PI INDICATOR 8.5 (GLOVE) ×1
GLOVE SURG ORTHO 8.0 STRL STRW (GLOVE) ×2 IMPLANT
GOWN STRL REUS W/ TWL LRG LVL3 (GOWN DISPOSABLE) ×1 IMPLANT
GOWN STRL REUS W/TWL LRG LVL3 (GOWN DISPOSABLE) ×1
GOWN STRL REUS W/TWL XL LVL3 (GOWN DISPOSABLE) ×2 IMPLANT
K-WIRE .035X4 (WIRE) IMPLANT
LOOP VESSEL MAXI BLUE (MISCELLANEOUS) IMPLANT
NEEDLE HYPO 25X1 1.5 SAFETY (NEEDLE) IMPLANT
NEEDLE KEITH (NEEDLE) IMPLANT
NEEDLE PRECISIONGLIDE 27X1.5 (NEEDLE) ×2 IMPLANT
NS IRRIG 1000ML POUR BTL (IV SOLUTION) ×2 IMPLANT
PACK BASIN DAY SURGERY FS (CUSTOM PROCEDURE TRAY) ×2 IMPLANT
PAD CAST 3X4 CTTN HI CHSV (CAST SUPPLIES) ×1 IMPLANT
PAD CAST 4YDX4 CTTN HI CHSV (CAST SUPPLIES) IMPLANT
PADDING CAST ABS 3INX4YD NS (CAST SUPPLIES)
PADDING CAST ABS 4INX4YD NS (CAST SUPPLIES) ×1
PADDING CAST ABS COTTON 3X4 (CAST SUPPLIES) IMPLANT
PADDING CAST ABS COTTON 4X4 ST (CAST SUPPLIES) ×1 IMPLANT
PADDING CAST COTTON 3X4 STRL (CAST SUPPLIES) ×1
PADDING CAST COTTON 4X4 STRL (CAST SUPPLIES)
SLEEVE SCD COMPRESS KNEE MED (MISCELLANEOUS) IMPLANT
SLING ARM FOAM STRAP MED (SOFTGOODS) ×2 IMPLANT
SPLINT PLASTER CAST XFAST 3X15 (CAST SUPPLIES) IMPLANT
SPLINT PLASTER XTRA FASTSET 3X (CAST SUPPLIES)
STOCKINETTE 4X48 STRL (DRAPES) ×2 IMPLANT
SUT CHROMIC 5 0 P 3 (SUTURE) IMPLANT
SUT ETHIBOND 3-0 V-5 (SUTURE) IMPLANT
SUT ETHILON 3 0 PS 1 (SUTURE) IMPLANT
SUT ETHILON 4 0 PS 2 18 (SUTURE) ×4 IMPLANT
SUT FIBERWIRE 4-0 18 DIAM BLUE (SUTURE)
SUT MERSILENE 2.0 SH NDLE (SUTURE) IMPLANT
SUT MERSILENE 4 0 P 3 (SUTURE) IMPLANT
SUT MERSILENE 6 0 P 1 (SUTURE) ×2 IMPLANT
SUT POLY BUTTON 15MM (SUTURE) IMPLANT
SUT PROLENE 2 0 SH DA (SUTURE) IMPLANT
SUT PROLENE 5 0 P 3 (SUTURE) IMPLANT
SUT SILK 4 0 PS 2 (SUTURE) IMPLANT
SUT SUPRAMID 4-0 (SUTURE) ×2 IMPLANT
SUT VIC AB 4-0 P-3 18XBRD (SUTURE) IMPLANT
SUT VIC AB 4-0 P3 18 (SUTURE)
SUT VICRYL 4-0 PS2 18IN ABS (SUTURE) IMPLANT
SUTURE FIBERWR 4-0 18 DIA BLUE (SUTURE) IMPLANT
SYR BULB 3OZ (MISCELLANEOUS) ×2 IMPLANT
SYR CONTROL 10ML LL (SYRINGE) ×2 IMPLANT
TOWEL OR 17X24 6PK STRL BLUE (TOWEL DISPOSABLE) ×2 IMPLANT
TUBE FEEDING 5FR 15 INCH (TUBING) IMPLANT
UNDERPAD 30X30 (UNDERPADS AND DIAPERS) ×2 IMPLANT

## 2015-11-23 NOTE — Anesthesia Postprocedure Evaluation (Addendum)
Anesthesia Post Note  Patient: [name]  Procedure(s) Performed: Procedure(s) (LRB): RECONSTUCTION FLEXOR DIGITORUM PROFUNDUS RIGHT SMALL FINGER  POLLICIS LONGUS GRAFT  (Right)  Patient location during evaluation: PACU Anesthesia Type: General and Regional Level of consciousness: awake and alert Pain management: pain level controlled Vital Signs Assessment: post-procedure vital signs reviewed and stable Respiratory status: spontaneous breathing Cardiovascular status: blood pressure returned to baseline Anesthetic complications: no    Last Vitals:  Filed Vitals:   11/23/15 1445 11/23/15 1500  BP: 140/73   Pulse: 82 78  Temp:    Resp: 20 16    Last Pain:  Filed Vitals:   11/23/15 1501  PainSc: 0-No pain                 Tiajuana Amass

## 2015-11-23 NOTE — Op Note (Signed)
I assisted Surgeon(s) and Role:    * Daryll Brod, MD - Primary    * Leanora Cover, MD on the Procedure(s): RECONSTUCTION FLEXOR DIGITORUM PROFUNDUS RIGHT SMALL FINGER  POLLICIS LONGUS GRAFT  on 11/23/2015.  I provided assistance on this case as follows: retraction of soft tissues and positioning of finger to allow access to the flexor tendon for repair.  Electronically signed by: Tennis Must, MD Date: 11/23/2015 Time: 2:32 PM

## 2015-11-23 NOTE — Anesthesia Procedure Notes (Addendum)
Anesthesia Regional Block:  Axillary brachial plexus block  Pre-Anesthetic Checklist: ,, timeout performed, Correct Patient, Correct Site, Correct Laterality, Correct Procedure, Correct Position, site marked, Risks and benefits discussed,  Surgical consent,  Pre-op evaluation,  At surgeon's request and post-op pain management  Laterality: Right  Prep: chloraprep       Needles:  Injection technique: Single-shot  Needle Type: Echogenic Needle     Needle Length: 9cm 9 cm Needle Gauge: 21 and 21 G    Additional Needles:  Procedures: ultrasound guided (picture in chart) Axillary brachial plexus block Narrative:  Start time: 11/23/2015 12:19 PM End time: 11/23/2015 12:21 PM Injection made incrementally with aspirations every 5 mL.  Performed by: Personally  Anesthesiologist: Suella Broad D  Additional Notes: No immediate complications noted.    Procedure Name: LMA Insertion Date/Time: 11/23/2015 1:15 PM Performed by: Melynda Ripple D Pre-anesthesia Checklist: Patient identified, Emergency Drugs available, Suction available and Patient being monitored Patient Re-evaluated:Patient Re-evaluated prior to inductionOxygen Delivery Method: Circle System Utilized Preoxygenation: Pre-oxygenation with 100% oxygen Intubation Type: IV induction Ventilation: Mask ventilation without difficulty LMA: LMA inserted LMA Size: 4.0 Number of attempts: 1 Airway Equipment and Method: Bite block Placement Confirmation: positive ETCO2 Tube secured with: Tape Dental Injury: Teeth and Oropharynx as per pre-operative assessment

## 2015-11-23 NOTE — Op Note (Signed)
Dictation Number [id number]

## 2015-11-23 NOTE — H&P (Signed)
[name] is an 65 y.o. female.   Chief Complaint: inability to move right small finger HPI:[name] is a 65 year old right hand dominant female who suffered a laceration to her right small finger. She is referred by Dr. Chauncey Reading. The injury occurred with a butcher knife separating chocolate, she slid down the blade and this occurred over the Christmas, she placed Neosporin and dressed it, the wound is healed, but she is unable to bend her small finger on her right hand. She has no other prior history of injuries. She is not complaining of numbness or tingling. She has a prior history of trigger finger releases on her right middle finger. She has occasional numbness and tingling to the fingers. She is not complaining of any pain. She is not taking anything at the present time. She states this is now five weeks. She has a history of arthritis, and thyroid problems. She has no history of diabetes or gout. There is a family history of diabetes, thyroid problems and arthritis. She suffered small lacerations over her palm the middle and ring fingers. She is not complaining of either of those being problematic for her.  Past Medical History  Diagnosis Date  . GERD (gastroesophageal reflux disease)  . Hypertension  . Thyroid disease   Past Surgical History  Procedure Laterality Date  . Hysterectomy  . Appendectomy  . Hand surgery  . Melanoma excision leg  . Tubal ligation  . Temporomandibular joint surgery    Past Medical History  Diagnosis Date  . Hypertension   . Barrett's esophagus   . IBS (irritable bowel syndrome)   . GERD (gastroesophageal reflux disease)   . Hiatal hernia   . Thyroid disease   . Diverticulosis of colon (without mention of hemorrhage)   . Hypothyroidism   . Skin cancer   . Irritable bowel syndrome with diarrhea     Past Surgical History  Procedure Laterality Date  . Total vaginal hysterectomy    . Colonoscopy  2009    562.10  . Tmj arthroplasty    .  Trigger finger release    . Nissen fundoplication  2248  . Appendectomy    . Skin cancer removal from right thigh     . Laparoscopic nissen fundoplication N/A 25/0/0370    Procedure: REDO NISSEN FUNDOPLICATION WITH UPPER ENDOSCOPY;  Surgeon: Kaylyn Lim, MD;  Location: [facility];  Service: General;  Laterality: N/A;  . Abdominal hysterectomy      Family History  Problem Relation Age of Onset  . Throat cancer Father 25    and mouth  . Colon cancer Neg Hx   . Heart disease Mother   . Diabetes Mother   . Hypertension Father   . Hypertension Mother    Social History:  reports that she has never smoked. She has never used smokeless tobacco. She reports that she drinks alcohol. She reports that she does not use illicit drugs.  Allergies:  Allergies  Allergen Reactions  . Alendronate Sodium Other (See Comments)    Irritates GERD  . Ciprofloxacin     REACTION: rash, hives  . Codeine Nausea Only    REACTION: NAUSEA  . Tetracycline     REACTION: rash, hives    Medications Prior to Admission  Medication Sig Dispense Refill  . acetaminophen (TYLENOL) 325 MG tablet Take 325 mg by mouth at bedtime.     Marland Kitchen aspirin 81 MG tablet Take 81 mg by mouth every morning.     . Calcium  Carbonate-Vitamin D (CALCIUM 600 + D PO) Take 1 tablet by mouth daily at 12 noon.     . famotidine (PEPCID) 20 MG tablet Take 20 mg by mouth 3 (three) times daily.     . hydrochlorothiazide (HYDRODIURIL) 25 MG tablet Take 25 mg by mouth every morning.    . hyoscyamine (LEVSIN SL) 0.125 MG SL tablet Place 0.125 mg under the tongue every 6 (six) hours as needed for cramping.     Marland Kitchen levothyroxine (SYNTHROID, LEVOTHROID) 88 MCG tablet Take 88 mcg by mouth every morning.     . Multiple Vitamin (MULTIVITAMIN) tablet Take 1 tablet by mouth daily at 12 noon.     . Probiotic Product (ALIGN) 4 MG CAPS Take 1 capsule by mouth every morning.     . raloxifene (EVISTA) 60 MG tablet Take 60 mg by mouth every morning.     . traMADol  (ULTRAM) 50 MG tablet Take 1 tablet (50 mg total) by mouth every 6 (six) hours as needed for moderate pain. 50 tablet 0  . valACYclovir (VALTREX) 1000 MG tablet Take 1,000 mg by mouth every morning.     . rifaximin (XIFAXAN) 550 MG TABS tablet Take 1 tablet (550 mg total) by mouth 3 (three) times daily. 42 tablet 0    Results for orders placed or performed during the hospital encounter of 11/22/15 (from the past 48 hour(s))  Basic metabolic panel     Status: None   Collection Time: 11/22/15  8:30 AM  Result Value Ref Range   Sodium 141 135 - 145 mmol/L   Potassium 4.0 3.5 - 5.1 mmol/L   Chloride 104 101 - 111 mmol/L   CO2 29 22 - 32 mmol/L   Glucose, Bld 86 65 - 99 mg/dL   BUN 13 6 - 20 mg/dL   Creatinine, Ser 0.69 0.44 - 1.00 mg/dL   Calcium 9.0 8.9 - 10.3 mg/dL   GFR calc non Af Amer >60 >60 mL/min   GFR calc Af Amer >60 >60 mL/min    Comment: (NOTE) The eGFR has been calculated using the CKD EPI equation. This calculation has not been validated in all clinical situations. eGFR's persistently <60 mL/min signify possible Chronic Kidney Disease.    Anion gap 8 5 - 15    No results found.   Pertinent items noted in HPI and remainder of comprehensive ROS otherwise negative.  Blood pressure 134/73, pulse 68, temperature 98.3 F (36.8 C), temperature source Oral, resp. rate 20, height '5\' 3"'  (1.6 m), weight 68.04 kg (150 lb), SpO2 100 %.  General appearance: alert, cooperative and appears stated age Head: Normocephalic, without obvious abnormality Neck: no JVD Resp: clear to auscultation bilaterally Cardio: regular rate and rhythm, S1, S2 normal, no murmur, click, rub or gallop GI: soft, non-tender; bowel sounds normal; no masses,  no organomegaly Extremities: no flexion ability right small finger Pulses: 2+ and symmetric Skin: Skin color, texture, turgor normal. No rashes or lesions Neurologic: Grossly normal Incision/Wound: healed  Assessment/Plan  Diagnosis:   laceration of superficialis and profundus tendon small finger.  Plan; We have had a long discussion with respect exploration and possible repair. We do this at six weeks, she shows reasonable settling of her tissues. She is advised; however, this may require a tendon rod and graft that it is possible that we can do a primary tendon graft. She desires lungs on both sides. She is advised that there is no guarantee with the surgery, possibility of infection, recurrence, injury  to arteries, nerves, tendons, and dystrophy. She is advised to apply a graft as necessary. We will use palmaris longus. She is advised that the rod is necessary, then a two-stage procedure will have to be undertaken she is aware that she will be in a limited work for three months for healing for this 6-8 months for a tendon rod graft. She is aware that the present tendons will have to be removed this will be scheduled as an outpatient under regional anesthesia. Pre and postoperative course were discussed at length with her. She has elected to proceed and is scheduled for a primary tendon grafting, possible staged tendon reconstruction right small fingers as an outpatient under regional anesthesia.   Jeet Shough R 11/23/2015, 11:51 AM

## 2015-11-23 NOTE — Discharge Instructions (Addendum)

## 2015-11-23 NOTE — Progress Notes (Signed)
Assisted Dr. Hollis with right, ultrasound guided, axillary block. Side rails up, monitors on throughout procedure. See vital signs in flow sheet. Tolerated Procedure well. 

## 2015-11-23 NOTE — Anesthesia Preprocedure Evaluation (Addendum)
Anesthesia Evaluation  Patient identified by MRN, date of birth, ID band Patient awake    Reviewed: Allergy & Precautions, NPO status , Patient's Chart, lab work & pertinent test results  Airway Mallampati: I  TM Distance: >3 FB Neck ROM: Full    Dental  (+) Teeth Intact   Pulmonary neg pulmonary ROS,    breath sounds clear to auscultation       Cardiovascular hypertension, Pt. on medications  Rhythm:Regular Rate:Normal     Neuro/Psych  Neuromuscular disease negative psych ROS   GI/Hepatic Neg liver ROS, hiatal hernia, GERD  Medicated,  Endo/Other  Hypothyroidism   Renal/GU negative Renal ROS  negative genitourinary   Musculoskeletal negative musculoskeletal ROS (+)   Abdominal   Peds negative pediatric ROS (+)  Hematology negative hematology ROS (+)   Anesthesia Other Findings   Reproductive/Obstetrics negative OB ROS                           Anesthesia Physical Anesthesia Plan  ASA: II  Anesthesia Plan: General   Post-op Pain Management: GA combined w/ Regional for post-op pain   Induction: Intravenous  Airway Management Planned: LMA  Additional Equipment:   Intra-op Plan:   Post-operative Plan: Extubation in OR  Informed Consent: I have reviewed the patients History and Physical, chart, labs and discussed the procedure including the risks, benefits and alternatives for the proposed anesthesia with the patient or authorized representative who has indicated his/her understanding and acceptance.   Dental advisory given  Plan Discussed with: CRNA  Anesthesia Plan Comments:         Anesthesia Quick Evaluation

## 2015-11-23 NOTE — Brief Op Note (Signed)
11/23/2015  2:25 PM  PATIENT:  [name]  65 y.o. female  PRE-OPERATIVE DIAGNOSIS:  LACERATION RIGHT SMALL FINGER  POST-OPERATIVE DIAGNOSIS:  LACERATION RIGHT SMALL FINGER  PROCEDURE:  Procedure(s): RECONSTUCTION FLEXOR DIGITORUM PROFUNDUS RIGHT SMALL FINGER  POLLICIS LONGUS GRAFT  (Right)  SURGEON:  Surgeon(s) and Role:    * Daryll Brod, MD - Primary    * Leanora Cover, MD  PHYSICIAN ASSISTANT:   ASSISTANTS: K Hai Grabe,MD   ANESTHESIA:   local, regional and general  EBL:  Total I/O In: 1000 [I.V.:1000] Out: -   BLOOD ADMINISTERED:none  DRAINS: none   LOCAL MEDICATIONS USED:  BUPIVICAINE   SPECIMEN:  No Specimen  DISPOSITION OF SPECIMEN:  N/A  COUNTS:  YES  TOURNIQUET:   Total Tourniquet Time Documented: Upper Arm (Right) - 54 minutes Total: Upper Arm (Right) - 54 minutes   DICTATION: .Other Dictation: Dictation Number [id number]  PLAN OF CARE: Discharge to home after PACU  PATIENT DISPOSITION:  ICU - intubated and hemodynamically stable.

## 2015-11-23 NOTE — Transfer of Care (Signed)
Immediate Anesthesia Transfer of Care Note  Patient: [name]  Procedure(s) Performed: Procedure(s): RECONSTUCTION FLEXOR DIGITORUM PROFUNDUS RIGHT SMALL FINGER  POLLICIS LONGUS GRAFT  (Right)  Patient Location: PACU  Anesthesia Type:GA combined with regional for post-op pain  Level of Consciousness: awake, alert  and oriented  Airway & Oxygen Therapy: Patient Spontanous Breathing and Patient connected to face mask oxygen  Post-op Assessment: Report given to RN and Post -op Vital signs reviewed and stable  Post vital signs: Reviewed and stable  Last Vitals:  Filed Vitals:   11/23/15 1235 11/23/15 1240  BP:    Pulse: 88 84  Temp:    Resp: 18 17    Complications: No apparent anesthesia complications

## 2015-11-23 NOTE — Op Note (Signed)
[name], [name]                ACCOUNT NO.:  [id number]  MEDICAL RECORD NO.:  [id number]  LOCATION:                                 FACILITY:  PHYSICIAN:  Daryll Brod, M.D.       DATE OF BIRTH:  1951-09-25  DATE OF PROCEDURE:  11/23/2015 DATE OF DISCHARGE:                              OPERATIVE REPORT   PREOPERATIVE DIAGNOSIS:  Laceration flexor tendon, right small finger.  POSTOPERATIVE DIAGNOSIS:  Laceration flexor tendon, right small finger.  OPERATION:  Repair of flexor digitorum profundus, right small finger.  SURGEON:  Daryll Brod, M.D.  ASSISTANT:  Leanora Cover, MD.  ANESTHESIA:  Supraclavicular block general with local infiltration.  ANESTHESIOLOGIST:  Hall.  HISTORY:  The patient is a 65 year old female, who suffered a laceration to her right small finger approximately 6 weeks ago, Christmas, she did not seek medical attention until 5-1/2 half weeks later when she was seen with the inability to flex the small finger on her right hand at the PIP, DIP joint.  She was scheduled for repair, reconstruction with possible graft, possible tendon rod followed by grafting.  Pre, peri, and postoperative courses have been discussed along with risks and complications.  She is aware that there is no guarantee with the surgery; possibility of infection; recurrence of injury to arteries, nerves, tendons; incomplete relief of symptoms; dystrophy.  The amount of therapy required, the expectations of incomplete mobility, possibility of rupture of repair, possibility of tenolysis.  In the preoperative area, the patient is seen, the extremity marked by both patient and surgeon.  Antibiotic given.  PROCEDURE IN DETAIL:  The patient was brought to the operating room, where a general anesthetic was carried out without difficulty. Supraclavicular block was carried out in the preoperative area, but she was still able to move her small finger.  She was prepped using ChloraPrep, supine  position with right arm free.  A 3-minute dry time was allowed.  Time-out taken, confirming the patient and procedure.  The limb was exsanguinated with an Esmarch bandage.  Tourniquet placed high on the arm was inflated to 250 mmHg.  A volar Bruner incision was made, carried down through subcutaneous tissue.  Bleeders were electrocauterized with bipolar.  The dissection carried down, the MT flexor sheath was identified, this had not scarred down.  The wound was extended proximally into the mid palm.  This allowed identification of the profundus tendon.  This was a graft, then placed under traction came out nearly to full length, had some scarring beginning around the portion of the epitenon.  This was able to be stripped off and the tendon came out to full length, did not have any significant muscle contracture to it.  It was decided to proceed with a tendon repair rather than reconstruction.  The sheath was followed distally.  The stump distally was found that the A4 pulley.  Scar was removed from the sheath where this was attempting to heal and this allowed the distal stump to be brought to the proximal aspect of the A4 pulley.  A small incision was made between the A1 and A2.  A 4-0 Supramid suture was then placed into  the proximal stump, this was a modified Kessler.  This was then delivered distally through each segment of the sheath and brought out to the A4 pulley where it was pinned, the A2 pulley remained in its position.  The completion of the repair was performed distally with the 4-0 Supramid double-arm suture creating a 4-strand repair.  This was tied.  A 5-0 epitenon suture using Mersilene was then inserted.  A stable repair was identified, this was able to pass through the pulley system without difficulty on removal of the 25-gauge needle.  Wound was copiously irrigated with saline.  The skin then closed with interrupted 4-0 nylon sutures.  A local infiltration at the mid  palm to the ulnar nerve complex was then performed, approximately 7 mL was used.  A sterile compressive dressing, dorsal splint with the wrist flexed approximately 20 degrees, fingers flex was applied prior to application of the dressing.  The finger cascade was in normal position.  The patient tolerated the procedure well.  On deflation of the tourniquet, all fingers immediately pinked.  She was taken to the recovery room for observation in satisfactory condition.  She will return to the [facility] in 1 week.          ______________________________ Daryll Brod, M.D.     GK/MEDQ  D:  11/23/2015  T:  11/23/2015  Job:  [id number]

## 2015-11-24 ENCOUNTER — Telehealth: Payer: Self-pay | Admitting: Internal Medicine

## 2015-11-24 ENCOUNTER — Encounter (HOSPITAL_BASED_OUTPATIENT_CLINIC_OR_DEPARTMENT_OTHER): Payer: Self-pay | Admitting: Orthopedic Surgery

## 2015-11-24 NOTE — Telephone Encounter (Signed)
Noted  

## 2015-11-30 NOTE — Telephone Encounter (Signed)
Patient's [employer] has approved patient's Xifaxan from 11/30/15-12/14/15. Reference number [id number].

## 2015-12-05 ENCOUNTER — Telehealth: Payer: Self-pay | Admitting: Internal Medicine

## 2015-12-05 NOTE — Telephone Encounter (Signed)
Noted  

## 2016-05-16 DIAGNOSIS — G8929 Other chronic pain: Secondary | ICD-10-CM | POA: Diagnosis present

## 2016-05-16 DIAGNOSIS — M545 Low back pain: Secondary | ICD-10-CM

## 2016-12-19 ENCOUNTER — Emergency Department (HOSPITAL_COMMUNITY)
Admission: EM | Admit: 2016-12-19 | Discharge: 2016-12-20 | Disposition: A | Discharge status: Home / Self Care | Payer: BLUE CROSS/BLUE SHIELD | Attending: Emergency Medicine | Admitting: Emergency Medicine

## 2016-12-19 ENCOUNTER — Emergency Department (HOSPITAL_COMMUNITY): Payer: BLUE CROSS/BLUE SHIELD

## 2016-12-19 ENCOUNTER — Ambulatory Visit (INDEPENDENT_AMBULATORY_CARE_PROVIDER_SITE_OTHER): Payer: BLUE CROSS/BLUE SHIELD | Admitting: Emergency Medicine

## 2016-12-19 ENCOUNTER — Encounter (HOSPITAL_COMMUNITY): Payer: Self-pay

## 2016-12-19 VITALS — BP 102/80 | HR 98 | Temp 98.7°F | Resp 16 | Ht 63.0 in | Wt 151.2 lb

## 2016-12-19 DIAGNOSIS — M25559 Pain in unspecified hip: Secondary | ICD-10-CM

## 2016-12-19 DIAGNOSIS — K5732 Diverticulitis of large intestine without perforation or abscess without bleeding: Secondary | ICD-10-CM | POA: Diagnosis not present

## 2016-12-19 DIAGNOSIS — Z79899 Other long term (current) drug therapy: Secondary | ICD-10-CM | POA: Insufficient documentation

## 2016-12-19 DIAGNOSIS — Z7982 Long term (current) use of aspirin: Secondary | ICD-10-CM | POA: Diagnosis not present

## 2016-12-19 DIAGNOSIS — G894 Chronic pain syndrome: Secondary | ICD-10-CM

## 2016-12-19 DIAGNOSIS — I10 Essential (primary) hypertension: Secondary | ICD-10-CM | POA: Diagnosis not present

## 2016-12-19 DIAGNOSIS — R109 Unspecified abdominal pain: Secondary | ICD-10-CM | POA: Diagnosis not present

## 2016-12-19 DIAGNOSIS — E119 Type 2 diabetes mellitus without complications: Secondary | ICD-10-CM | POA: Insufficient documentation

## 2016-12-19 DIAGNOSIS — Z85828 Personal history of other malignant neoplasm of skin: Secondary | ICD-10-CM | POA: Insufficient documentation

## 2016-12-19 MED ORDER — ONDANSETRON 4 MG PO TBDP
4.0000 mg | ORAL_TABLET | Freq: Once | ORAL | Status: DC
  Filled 2016-12-19: qty 1

## 2016-12-19 MED ORDER — OXYCODONE-ACETAMINOPHEN 5-325 MG PO TABS
1.0000 | ORAL_TABLET | ORAL | Status: DC | PRN
  Filled 2016-12-19 (×2): qty 1

## 2016-12-19 NOTE — ED Notes (Signed)
Pt transported to CT ?

## 2016-12-19 NOTE — Patient Instructions (Addendum)
Advised to go to ER now. Kidney Stones Kidney stones (urolithiasis) are rock-like masses that form inside of the kidneys. Kidneys are organs that make pee (urine). A kidney stone can cause very bad pain and can block the flow of pee. The stone usually leaves your body (passes) through your pee. You may need to have a doctor take out the stone. Follow these instructions at home: Eating and drinking   Drink enough fluid to keep your pee clear or pale yellow. This will help you pass the stone.  If told by your doctor, change the foods you eat (your diet). This may include:  Limiting how much salt (sodium) you eat.  Eating more fruits and vegetables.  Limiting how much meat, poultry, fish, and eggs you eat.  Follow instructions from your doctor about eating or drinking restrictions. General instructions   Collect pee samples as told by your doctor. You may need to collect a pee sample:  24 hours after a stone comes out.  8-12 weeks after a stone comes out, and every 6-12 months after that.  Strain your pee every time you pee (urinate), for as long as told. Use the strainer that your doctor recommends.  Do not throw out the stone. Keep it so that it can be tested by your doctor.  Take over-the-counter and prescription medicines only as told by your doctor.  Keep all follow-up visits as told by your doctor. This is important. You may need follow-up tests. Preventing kidney stones  To prevent another kidney stone:  Drink enough fluid to keep your pee clear or pale yellow. This is the best way to prevent kidney stones.  Eat healthy foods.  Avoid certain foods as told by your doctor. You may be told to eat less protein.  Stay at a healthy weight. Contact a doctor if:  You have pain that gets worse or does not get better with medicine. Get help right away if:  You have a fever or chills.  You get very bad pain.  You get new pain in your belly (abdomen).  You pass out  (faint).  You cannot pee. This information is not intended to replace advice given to you by your health care provider. Make sure you discuss any questions you have with your health care provider. Document Released: 03/25/2008 Document Revised: 06/25/2016 Document Reviewed: 06/25/2016 Elsevier Interactive Patient Education  2017 [employer].    IF you received an x-ray today, you will receive an invoice from [facility]iology. Please contact [facility]iology at [phone] with questions or concerns regarding your invoice.   IF you received labwork today, you will receive an invoice from [facility]. Please contact [facility] at [phone] with questions or concerns regarding your invoice.   Our billing staff will not be able to assist you with questions regarding bills from these companies.  You will be contacted with the lab results as soon as they are available. The fastest way to get your results is to activate your My Chart account. Instructions are located on the last page of this paperwork. If you have not heard from [place] regarding the results in 2 weeks, please contact this office.    We recommend that you schedule a mammogram for breast cancer screening. Typically, you do not need a referral to do this. Please contact a local imaging center to schedule your mammogram.  [facility] ([place]) - [phone]  *ask for the Radiology [place] ([place]) - [phone] or [phone])  [place] [phone] [place] [phone] [facility] - [phone]  *ask for the [facility] - [phone]  *ask for the Radiology Department [facility] - [phone]  *ask for the [place] - [phone]

## 2016-12-19 NOTE — ED Triage Notes (Signed)
Pt with rt flank pain starting yesterday.  Hx of kidney stones.  Pt states pain radiates to groin.  Went to [place] today and told to come here.  MD called ahead for suspected kidney stone.

## 2016-12-19 NOTE — Progress Notes (Signed)
[name] 66 y.o.   Chief Complaint  Patient presents with  . Flank Pain    per patient on RIGHT side x 1 day  . Pelvic Pain    with tenderness     HISTORY OF PRESENT ILLNESS: This is a 66 y.o. female complaining of right flank pain x 24 hours radiating to front and into pelvic area; pain different than chronic lumbar pain; has some urinary symptoms; feels like this could be a kidney stone.  HPI   Prior to Admission medications   Medication Sig Start Date End Date Taking? Authorizing Provider  acetaminophen (TYLENOL) 325 MG tablet Take 325 mg by mouth at bedtime.    Yes Historical Provider, MD  aspirin 81 MG tablet Take 81 mg by mouth every morning.    Yes Historical Provider, MD  Calcium Carbonate-Vitamin D (CALCIUM 600 + D PO) Take 1 tablet by mouth daily at 12 noon.    Yes Historical Provider, MD  famotidine (PEPCID) 20 MG tablet Take 20 mg by mouth 3 (three) times daily.    Yes Historical Provider, MD  hydrochlorothiazide (HYDRODIURIL) 25 MG tablet Take 25 mg by mouth every morning.   Yes Historical Provider, MD  hyoscyamine (LEVSIN SL) 0.125 MG SL tablet Place 0.125 mg under the tongue every 6 (six) hours as needed for cramping.    Yes Historical Provider, MD  levothyroxine (SYNTHROID, LEVOTHROID) 88 MCG tablet Take 88 mcg by mouth every morning.    Yes Historical Provider, MD  Multiple Vitamin (MULTIVITAMIN) tablet Take 1 tablet by mouth daily at 12 noon.    Yes Historical Provider, MD  Probiotic Product (ALIGN) 4 MG CAPS Take 1 capsule by mouth every morning.    Yes Historical Provider, MD  raloxifene (EVISTA) 60 MG tablet Take 60 mg by mouth every morning.    Yes Historical Provider, MD  traMADol (ULTRAM) 50 MG tablet Take 1 tablet (50 mg total) by mouth every 6 (six) hours as needed for moderate pain. 07/31/14  Yes Michael Boston, MD  valACYclovir (VALTREX) 1000 MG tablet Take 1,000 mg by mouth every morning.  02/11/11  Yes Historical Provider, MD  meperidine (DEMEROL) 50  MG tablet Take 1 tablet (50 mg total) by mouth every 4 (four) hours as needed for severe pain. Patient not taking: Reported on 12/19/2016 11/23/15   Daryll Brod, MD  rifaximin (XIFAXAN) 550 MG TABS tablet Take 1 tablet (550 mg total) by mouth 3 (three) times daily. Patient not taking: Reported on 12/19/2016 11/21/15   Jerene Bears, MD    Allergies  Allergen Reactions  . Alendronate Sodium Other (See Comments)    Irritates GERD  . Ciprofloxacin     REACTION: rash, hives  . Codeine Nausea Only    REACTION: NAUSEA  . Tetracycline     REACTION: rash, hives    Patient Active Problem List   Diagnosis Date Noted  . Status post laparoscopic Nissen fundoplication 0000000  . Abdominal pain, epigastric 05/20/2014  . Diarrhea 03/29/2013  . Nonspecific (abnormal) findings on radiological and other examination of gastrointestinal tract 03/29/2013  . GERD (gastroesophageal reflux disease) 05/14/2011  . Bloating 05/14/2011  . Gas 05/14/2011  . Barrett esophagus 05/14/2011  . S/P redo revision of Lap Nissen Oct 2015 04/12/2011  . Duodenitis 04/12/2011  . Abdominal gas pain 04/12/2011  . HYPOTHYROIDISM 07/17/2010  . BARRETT'S ESOPHAGUS 11/10/2008  . GASTRITIS 10/05/2008  . LACTOSE INTOLERANCE 10/04/2008  . Essential hypertension 10/04/2008  . GERD 10/04/2008  .  HIATAL HERNIA 10/04/2008  . OSTEOPOROSIS 10/04/2008  . FLATULENCE ERUCTATION AND GAS PAIN 10/04/2008  . DIVERTICULOSIS, COLON 10/03/2008    Past Medical History:  Diagnosis Date  . Barrett's esophagus   . Diverticulosis of colon (without mention of hemorrhage)   . GERD (gastroesophageal reflux disease)   . Hiatal hernia   . Hypertension   . Hypothyroidism   . IBS (irritable bowel syndrome)   . Irritable bowel syndrome with diarrhea   . Skin cancer   . Thyroid disease     Past Surgical History:  Procedure Laterality Date  . ABDOMINAL HYSTERECTOMY    . APPENDECTOMY    . COLONOSCOPY  2009   562.10  . FLEXOR TENDON REPAIR  Right 11/23/2015   Procedure: RECONSTUCTION FLEXOR DIGITORUM PROFUNDUS RIGHT SMALL FINGER  POLLICIS LONGUS GRAFT ;  Surgeon: Daryll Brod, MD;  Location: [facility];  Service: Orthopedics;  Laterality: Right;  . LAPAROSCOPIC NISSEN FUNDOPLICATION N/A [id number]   Procedure: REDO NISSEN FUNDOPLICATION WITH UPPER ENDOSCOPY;  Surgeon: Kaylyn Lim, MD;  Location: [facility];  Service: General;  Laterality: N/A;  . NISSEN FUNDOPLICATION  [id number]  . skin cancer removal from right thigh     . TMJ ARTHROPLASTY    . TOTAL VAGINAL HYSTERECTOMY    . TRIGGER FINGER RELEASE      Social History   Social History  . Marital status: Married    Spouse name: N/A  . Number of children: N/A  . Years of education: N/A   Occupational History  . clerical    Social History Main Topics  . Smoking status: Never Smoker  . Smokeless tobacco: Never Used  . Alcohol use Yes     Comment: social  . Drug use: No  . Sexual activity: Not on file   Other Topics Concern  . Not on file   Social History Narrative  . No narrative on file    Family History  Problem Relation Age of Onset  . Throat cancer Father 18    and mouth  . Colon cancer Neg Hx   . Heart disease Mother   . Diabetes Mother   . Hypertension Father   . Hypertension Mother      Review of Systems  Constitutional: Negative for chills and fever.  HENT: Negative for sinus pain and sore throat.   Eyes: Negative for blurred vision and double vision.  Respiratory: Negative for cough and shortness of breath.   Cardiovascular: Negative for chest pain and palpitations.  Gastrointestinal: Positive for abdominal pain and nausea. Negative for diarrhea.  Genitourinary: Positive for flank pain and urgency.  Musculoskeletal: Positive for back pain (chronic).  Skin: Negative for rash.  Neurological: Negative for dizziness and headaches.  All other systems reviewed and are negative.  Vitals:   12/19/16 1722  BP: 102/80  Pulse: 98  Resp: 16    Temp: 98.7 F (37.1 C)     Physical Exam  Constitutional: She is oriented to person, place, and time. She appears well-developed and well-nourished.  HENT:  Head: Normocephalic and atraumatic.  Eyes: EOM are normal. Pupils are equal, round, and reactive to light.  Neck: Neck supple.  Cardiovascular: Normal rate and regular rhythm.   Pulmonary/Chest: Effort normal.  Abdominal: Soft. She exhibits no mass. There is tenderness (lower and suprapubic). There is no rebound.  Right flank tenderness  Musculoskeletal: Normal range of motion.  Neurological: She is alert and oriented to person, place, and time.  Skin: Skin is warm and  dry. Capillary refill takes less than 2 seconds.  Psychiatric: She has a normal mood and affect. Her behavior is normal.  Vitals reviewed.    ASSESSMENT & PLAN: Telina was seen today for flank pain and pelvic pain.  Diagnoses and all orders for this visit:  Flank pain Comments: suspected kidney stone  Pain in joint involving pelvic region and thigh, unspecified laterality    Patient Instructions    Advised to go to ER now. Kidney Stones Kidney stones (urolithiasis) are rock-like masses that form inside of the kidneys. Kidneys are organs that make pee (urine). A kidney stone can cause very bad pain and can block the flow of pee. The stone usually leaves your body (passes) through your pee. You may need to have a doctor take out the stone. Follow these instructions at home: Eating and drinking   Drink enough fluid to keep your pee clear or pale yellow. This will help you pass the stone.  If told by your doctor, change the foods you eat (your diet). This may include:  Limiting how much salt (sodium) you eat.  Eating more fruits and vegetables.  Limiting how much meat, poultry, fish, and eggs you eat.  Follow instructions from your doctor about eating or drinking restrictions. General instructions   Collect pee samples as told by your doctor.  You may need to collect a pee sample:  24 hours after a stone comes out.  8-12 weeks after a stone comes out, and every 6-12 months after that.  Strain your pee every time you pee (urinate), for as long as told. Use the strainer that your doctor recommends.  Do not throw out the stone. Keep it so that it can be tested by your doctor.  Take over-the-counter and prescription medicines only as told by your doctor.  Keep all follow-up visits as told by your doctor. This is important. You may need follow-up tests. Preventing kidney stones  To prevent another kidney stone:  Drink enough fluid to keep your pee clear or pale yellow. This is the best way to prevent kidney stones.  Eat healthy foods.  Avoid certain foods as told by your doctor. You may be told to eat less protein.  Stay at a healthy weight. Contact a doctor if:  You have pain that gets worse or does not get better with medicine. Get help right away if:  You have a fever or chills.  You get very bad pain.  You get new pain in your belly (abdomen).  You pass out (faint).  You cannot pee. This information is not intended to replace advice given to you by your health care provider. Make sure you discuss any questions you have with your health care provider. Document Released: 03/25/2008 Document Revised: 06/25/2016 Document Reviewed: 06/25/2016 Elsevier Interactive Patient Education  2017 [employer].    IF you received an x-ray today, you will receive an invoice from [facility] Radiology. Please contact [facility] Radiology at [phone] with questions or concerns regarding your invoice.   IF you received labwork today, you will receive an invoice from [facility]. Please contact [facility] at [phone] with questions or concerns regarding your invoice.   Our billing staff will not be able to assist you with questions regarding bills from these companies.  You will be contacted with the lab results as soon as  they are available. The fastest way to get your results is to activate your My Chart account. Instructions are located on the last page of this  paperwork. If you have not heard from [place] regarding the results in 2 weeks, please contact this office.    We recommend that you schedule a mammogram for breast cancer screening. Typically, you do not need a referral to do this. Please contact a local imaging center to schedule your mammogram.  [facility] - [phone]  *ask for the Radiology Department [facility] ([place]) - [phone] or [phone]  [facility] - [phone] [place] [phone] [facility] - [phone]  *ask for the [facility] - [phone]  *ask for the Radiology Department [facility] - [phone]  *ask for the Mammography Department [facility] - [phone]     Agustina Caroli, MD [facility]

## 2016-12-19 NOTE — ED Notes (Signed)
Provider at bedside

## 2016-12-19 NOTE — ED Provider Notes (Signed)
[facility] DEPT Provider Note   CSN: [id number] Arrival date & time: 12/19/16  1831  By signing my name below, I, Collene Leyden, attest that this documentation has been prepared under the direction and in the presence of Carlene Bickley [place] PA-C. Electronically Signed: Collene Leyden, Scribe. 12/19/16. 11:15 PM.  History   Chief Complaint Chief Complaint  Patient presents with  . Flank Pain    HPI Comments: [name] is a 66 y.o. female with a hx of GERD, diverticulosis, IBS, and Barrett's esophagus, who presents to the Emergency Department complaining of sudden-onset, constant right flank pain that began yesterday. Patient has associated radiation to the lower abdomen and groin, nausea, and discomfort while urinating (yesterday, no longer present). Patient reports going to urgent care today, in which she was referred here for an abdominal CT. Patient has had similar symptoms before, but did not go to the doctor and assumed she had kidney stones. Patient reports taking tramadol every night (Patient is on this medication chronically) with no relief in pain. Patient states her pain is worse when sitting. Patient denies tobacco use. Patient denies any dysuria, fever, cough, chills, chest pain, shortness of breath, recent UTI, dizziness, falling, visual changes, vomiting, melena, hematochezia, or hematuria.   The history is provided by the patient. No language interpreter was used.    Past Medical History:  Diagnosis Date  . Barrett's esophagus   . Diverticulosis of colon (without mention of hemorrhage)   . GERD (gastroesophageal reflux disease)   . Hiatal hernia   . Hypertension   . Hypothyroidism   . IBS (irritable bowel syndrome)   . Irritable bowel syndrome with diarrhea   . Skin cancer   . Thyroid disease     Patient Active Problem List   Diagnosis Date Noted  . Chronic pain syndrome 12/19/2016  . Flank pain 12/19/2016  . Status post laparoscopic Nissen fundoplication  0000000  . Abdominal pain, epigastric 05/20/2014  . Diarrhea 03/29/2013  . Nonspecific (abnormal) findings on radiological and other examination of gastrointestinal tract 03/29/2013  . GERD (gastroesophageal reflux disease) 05/14/2011  . Bloating 05/14/2011  . Gas 05/14/2011  . Barrett esophagus 05/14/2011  . S/P redo revision of Lap Nissen Oct 2015 04/12/2011  . Duodenitis 04/12/2011  . Abdominal gas pain 04/12/2011  . HYPOTHYROIDISM 07/17/2010  . BARRETT'S ESOPHAGUS 11/10/2008  . GASTRITIS 10/05/2008  . LACTOSE INTOLERANCE 10/04/2008  . Essential hypertension 10/04/2008  . GERD 10/04/2008  . HIATAL HERNIA 10/04/2008  . OSTEOPOROSIS 10/04/2008  . FLATULENCE ERUCTATION AND GAS PAIN 10/04/2008  . DIVERTICULOSIS, COLON 10/03/2008    Past Surgical History:  Procedure Laterality Date  . ABDOMINAL HYSTERECTOMY    . APPENDECTOMY    . COLONOSCOPY  2009   562.10  . FLEXOR TENDON REPAIR Right 11/23/2015   Procedure: RECONSTUCTION FLEXOR DIGITORUM PROFUNDUS RIGHT SMALL FINGER  POLLICIS LONGUS GRAFT ;  Surgeon: Daryll Brod, MD;  Location: [facility];  Service: Orthopedics;  Laterality: Right;  . LAPAROSCOPIC NISSEN FUNDOPLICATION N/A [id number]   Procedure: REDO NISSEN FUNDOPLICATION WITH UPPER ENDOSCOPY;  Surgeon: Kaylyn Lim, MD;  Location: [facility];  Service: General;  Laterality: N/A;  . NISSEN FUNDOPLICATION  [id number]  . skin cancer removal from right thigh     . TMJ ARTHROPLASTY    . TOTAL VAGINAL HYSTERECTOMY    . TRIGGER FINGER RELEASE      OB History    No data available       Home Medications    Prior  to Admission medications   Medication Sig Start Date End Date Taking? Authorizing Provider  acetaminophen (TYLENOL) 325 MG tablet Take 325 mg by mouth at bedtime.    Yes Historical Provider, MD  aspirin 81 MG tablet Take 81 mg by mouth every morning.    Yes Historical Provider, MD  Calcium Carbonate-Vitamin D (CALCIUM 600 + D PO) Take 1 tablet by mouth daily at  12 noon.    Yes Historical Provider, MD  famotidine (PEPCID) 20 MG tablet Take 20 mg by mouth 3 (three) times daily.    Yes Historical Provider, MD  hydrochlorothiazide (HYDRODIURIL) 25 MG tablet Take 25 mg by mouth every morning.   Yes Historical Provider, MD  hyoscyamine (LEVSIN SL) 0.125 MG SL tablet Place 0.125 mg under the tongue every 6 (six) hours as needed for cramping.    Yes Historical Provider, MD  levothyroxine (SYNTHROID, LEVOTHROID) 88 MCG tablet Take 88 mcg by mouth every morning.    Yes Historical Provider, MD  Multiple Vitamin (MULTIVITAMIN) tablet Take 1 tablet by mouth daily at 12 noon.    Yes Historical Provider, MD  Probiotic Product (ALIGN) 4 MG CAPS Take 1 capsule by mouth every morning.    Yes Historical Provider, MD  raloxifene (EVISTA) 60 MG tablet Take 60 mg by mouth every morning.    Yes Historical Provider, MD  traMADol (ULTRAM) 50 MG tablet Take 1 tablet (50 mg total) by mouth every 6 (six) hours as needed for moderate pain. 07/31/14  Yes Michael Boston, MD  valACYclovir (VALTREX) 1000 MG tablet Take 1,000 mg by mouth every morning.  02/11/11  Yes Historical Provider, MD  meperidine (DEMEROL) 50 MG tablet Take 1 tablet (50 mg total) by mouth every 4 (four) hours as needed for severe pain. Patient not taking: Reported on 12/19/2016 11/23/15   Daryll Brod, MD  rifaximin (XIFAXAN) 550 MG TABS tablet Take 1 tablet (550 mg total) by mouth 3 (three) times daily. Patient not taking: Reported on 12/19/2016 11/21/15   Jerene Bears, MD    Family History Family History  Problem Relation Age of Onset  . Throat cancer Father 54    and mouth  . Hypertension Father   . Heart disease Mother   . Diabetes Mother   . Hypertension Mother   . Colon cancer Neg Hx     Social History Social History  Substance Use Topics  . Smoking status: Never Smoker  . Smokeless tobacco: Never Used  . Alcohol use Yes     Comment: social     Allergies   Alendronate sodium; Ciprofloxacin; Codeine;  and Tetracycline   Review of Systems Review of Systems  Constitutional: Negative for chills and fever.  Gastrointestinal: Positive for abdominal pain (lower) and nausea. Negative for diarrhea and vomiting.  Genitourinary: Positive for difficulty urinating, flank pain (right) and pelvic pain. Negative for dysuria and hematuria.  Neurological: Negative for dizziness.     Physical Exam Updated Vital Signs BP 127/91 (BP Location: Left Arm)   Pulse 86   Temp 98.3 F (36.8 C) (Oral)   Resp 18   SpO2 99%   Physical Exam  Constitutional: She is oriented to person, place, and time. She appears well-developed.  HENT:  Head: Normocephalic and atraumatic.  Mouth/Throat: Oropharynx is clear and moist.  Eyes: Conjunctivae and EOM are normal. Pupils are equal, round, and reactive to light.  Neck: Normal range of motion. Neck supple.  Cardiovascular: Normal rate and regular rhythm.  Exam reveals no gallop  and no friction rub.   No murmur heard. Pulmonary/Chest: Effort normal. No respiratory distress. She has no wheezes. She has no rales. She exhibits no tenderness.  Crackles in all lung fields. Unlabored breathing.   Abdominal: Soft. Bowel sounds are normal. She exhibits no distension. There is tenderness. There is no guarding.  Tenderness on the lower abdomen bilaterally. Suprapubic tenderness.   Genitourinary:  Genitourinary Comments: Rigth-sided CVA tenderness.   Musculoskeletal: Normal range of motion. She exhibits no edema.  Neurological: She is alert and oriented to person, place, and time.  Skin: Skin is warm and dry.  Psychiatric: She has a normal mood and affect.     ED Treatments / Results  DIAGNOSTIC STUDIES: Oxygen Saturation is 99% on RA, normal by my interpretation.    COORDINATION OF CARE: 11:14 PM Discussed treatment plan with pt at bedside and pt agreed to plan, which includes an abdominal CT.   Labs (all labs ordered are listed, but only abnormal results are  displayed) Labs Reviewed  URINALYSIS, ROUTINE W REFLEX MICROSCOPIC   Results for orders placed or performed during the hospital encounter of [id number]  Basic metabolic panel  Result Value Ref Range   Sodium 141 135 - 145 mmol/L   Potassium 4.0 3.5 - 5.1 mmol/L   Chloride 104 101 - 111 mmol/L   CO2 29 22 - 32 mmol/L   Glucose, Bld 86 65 - 99 mg/dL   BUN 13 6 - 20 mg/dL   Creatinine, Ser 0.69 0.44 - 1.00 mg/dL   Calcium 9.0 8.9 - 10.3 mg/dL   GFR calc non Af Amer >60 >60 mL/min   GFR calc Af Amer >60 >60 mL/min   Anion gap 8 5 - 15    EKG  EKG Interpretation None       Radiology No results found. Ct Renal Stone Study  Result Date: 12/19/2016 CLINICAL DATA:  Acute onset of right flank pain, radiating to the groin. Initial encounter. EXAM: CT ABDOMEN AND PELVIS WITHOUT CONTRAST TECHNIQUE: Multidetector CT imaging of the abdomen and pelvis was performed following the standard protocol without IV contrast. COMPARISON:  None. FINDINGS: Lower chest: The visualized lung bases are grossly clear. The visualized portions of the mediastinum are unremarkable. Hepatobiliary: The liver is unremarkable in appearance. Likely tiny stones are seen dependently within the gallbladder. The gallbladder is otherwise unremarkable. The common bile duct remains normal in caliber. Pancreas: The pancreas is within normal limits. Spleen: The spleen is unremarkable in appearance. Adrenals/Urinary Tract: The adrenal glands are unremarkable in appearance. The kidneys are within normal limits. There is no evidence of hydronephrosis. No renal or ureteral stones are identified. No perinephric stranding is seen. Stomach/Bowel: The stomach is unremarkable in appearance. The patient is status post Nissen fundoplication. The small bowel is within normal limits. The patient is status post appendectomy. Mild wall thickening is noted along the mid sigmoid colon, with associated soft tissue inflammation and trace fluid, concerning  for acute diverticulitis. Diverticulosis is noted along the sigmoid colon. There is no evidence of perforation or abscess formation at this time. Vascular/Lymphatic: Scattered calcification is seen along the distal abdominal aorta and its branches. The abdominal aorta is otherwise grossly unremarkable. The inferior vena cava is grossly unremarkable. No retroperitoneal lymphadenopathy is seen. No pelvic sidewall lymphadenopathy is identified. Reproductive: The bladder is mildly distended and grossly unremarkable. The patient is status post hysterectomy. No suspicious adnexal masses are seen. Other: No additional soft tissue abnormalities are seen. Musculoskeletal: No acute osseous abnormalities  are identified. Vacuum phenomenon is noted at L3-L4. The visualized musculature is unremarkable in appearance. IMPRESSION: 1. Likely acute diverticulitis at the mid sigmoid colon, with mild wall thickening, soft tissue inflammation and trace fluid. Diverticulosis along the sigmoid colon. No evidence of perforation or abscess formation at this time. 2. Likely minimal cholelithiasis noted. 3. Scattered aortic atherosclerosis. Electronically Signed   By: Garald Balding M.D.   On: 12/19/2016 23:52    Procedures Procedures (including critical care time)  Medications Ordered in ED Medications  oxyCODONE-acetaminophen (PERCOCET/ROXICET) 5-325 MG per tablet 1 tablet (not administered)     Initial Impression / Assessment and Plan / ED Course  I have reviewed the triage vital signs and the nursing notes.  Pertinent labs & imaging results that were available during my care of the patient were reviewed by me and considered in my medical decision making (see chart for details).     Patient presents with right flank and lower abdominal pain. No fever, dysuria. CT done to evaluate for kidney stone and is negative for stone but shows sigmoid diverticulitis.   The patient is very well appearing. VSS and normal. No fever.  No bloody stools. She has a history of diverticulitis in the past. She can be discharged home with outpatient follow up with her GI doctor (Dr. Hilarie Fredrickson). Will Rx Augmentin on recommendation of pharmacy given her Cipro allergy.   Final Clinical Impressions(s) / ED Diagnoses   Final diagnoses:  None   1. Diverticulitis   New Prescriptions New Prescriptions   No medications on file   I personally performed the services described in this documentation, which was scribed in my presence. The recorded information has been reviewed and is accurate.     Charlann Lange, PA-C 12/20/16 0100    Daleen Bo, MD 12/20/16 1323

## 2016-12-20 ENCOUNTER — Telehealth: Payer: Self-pay | Admitting: Internal Medicine

## 2016-12-20 MED ORDER — AMOXICILLIN-POT CLAVULANATE ER 1000-62.5 MG PO TB12: 2.0000 | ORAL_TABLET | Freq: Two times a day (BID) | ORAL | 0 refills | Status: DC

## 2016-12-20 MED ORDER — AMOXICILLIN-POT CLAVULANATE ER 1000-62.5 MG PO TB12
2.0000 | ORAL_TABLET | Freq: Once | ORAL | Status: AC
  Administered 2016-12-20: 2 via ORAL
  Filled 2016-12-20: qty 2

## 2016-12-20 NOTE — Telephone Encounter (Signed)
Pt was seen in the ER yesterday and diagnosed with diverticulitis. She was placed on Augmentin 1000mg  BID for 7 days. Pt states she was told to follow-up with Dr. Hilarie Fredrickson. Please advise if pt needs OV or should she just contact the office if symptoms do not resolve or worsen.

## 2016-12-20 NOTE — Telephone Encounter (Signed)
Office followup is recommended  Her last colonoscopy was 9 yrs ago and we need to discuss repeating this once diverticulitis episode has resolved She is also due Barrett's surveillance this year

## 2016-12-20 NOTE — Telephone Encounter (Signed)
Pt scheduled for OV with Dr. Hilarie Fredrickson 02/06/17@10am . Pt aware of appt.

## 2016-12-20 NOTE — Telephone Encounter (Signed)
Left message for pt to call back  °

## 2017-01-09 ENCOUNTER — Other Ambulatory Visit: Payer: Self-pay | Admitting: Internal Medicine

## 2017-01-09 DIAGNOSIS — Z1231 Encounter for screening mammogram for malignant neoplasm of breast: Secondary | ICD-10-CM

## 2017-02-03 ENCOUNTER — Ambulatory Visit: Payer: BLUE CROSS/BLUE SHIELD

## 2017-02-06 ENCOUNTER — Ambulatory Visit (INDEPENDENT_AMBULATORY_CARE_PROVIDER_SITE_OTHER): Payer: BLUE CROSS/BLUE SHIELD | Admitting: Internal Medicine

## 2017-02-06 ENCOUNTER — Encounter: Payer: Self-pay | Admitting: Internal Medicine

## 2017-02-06 VITALS — BP 134/80 | HR 82 | Ht 63.5 in | Wt 152.6 lb

## 2017-02-06 DIAGNOSIS — Z8719 Personal history of other diseases of the digestive system: Secondary | ICD-10-CM

## 2017-02-06 DIAGNOSIS — K2270 Barrett's esophagus without dysplasia: Secondary | ICD-10-CM | POA: Diagnosis not present

## 2017-02-06 DIAGNOSIS — K219 Gastro-esophageal reflux disease without esophagitis: Secondary | ICD-10-CM | POA: Diagnosis not present

## 2017-02-06 DIAGNOSIS — K5730 Diverticulosis of large intestine without perforation or abscess without bleeding: Secondary | ICD-10-CM

## 2017-02-06 MED ORDER — NA SULFATE-K SULFATE-MG SULF 17.5-3.13-1.6 GM/177ML PO SOLN: ORAL | 0 refills | Status: DC

## 2017-02-06 MED ORDER — HYOSCYAMINE SULFATE 0.125 MG SL SUBL: 0.1250 mg | SUBLINGUAL_TABLET | Freq: Four times a day (QID) | SUBLINGUAL | 2 refills | Status: DC | PRN

## 2017-02-06 NOTE — Patient Instructions (Signed)
You have been scheduled for an endoscopy and colonoscopy. Please follow the written instructions given to you at your visit today. Please pick up your prep supplies at the pharmacy within the next 1-3 days. If you use inhalers (even only as needed), please bring them with you on the day of your procedure. Your physician has requested that you go to www.startemmi.com and enter the access code given to you at your visit today. This web site gives a general overview about your procedure. However, you should still follow specific instructions given to you by our office regarding your preparation for the procedure.  We have sent the following medications to your pharmacy for you to pick up at your convenience: Levsin  If you are age 82 or older, your body mass index should be between 23-30. Your Body mass index is 26.61 kg/m. If this is out of the aforementioned range listed, please consider follow up with your Primary Care Provider.  If you are age 85 or younger, your body mass index should be between 19-25. Your Body mass index is 26.61 kg/m. If this is out of the aformentioned range listed, please consider follow up with your Primary Care Provider.

## 2017-02-06 NOTE — Progress Notes (Signed)
Subjective:    Patient ID: [name], female    DOB: 11-26-1950, 66 y.o.   MRN: [id number]  HPI [name] is a 66 year old female with history of GERD with Barrett's esophagus status post Nissen fundoplication and redo fundoplication, history of colonic diverticulosis and recent diverticulitis, IBS who is here for follow-up. He was last seen in the office in January 2017 and presents alone today.  She reports that in early March 2018 she developed sharp right-sided abdominal pain radiating to her groin. This was associated also with extreme mid lower abdominal pain. This progressed to severe and she was seen in the ER. She thought she had a kidney stone. She reports feeling like she could not void or have normal bowel movements during this time. A CT scan was performed without contrast renal stone protocol, and showed sigmoid wall thickening and inflammation consistent with diverticulitis. She was treated with Augmentin for 7 days. She reports the pain improved in 2 days and her bowel habits and urinary flow returned to normal.  She was treated with rifaximin for IBS with diarrhea predominance 1 year ago in this did improve her symptoms however over the last few months to the last 6 months she has had more lower abdominal cramping and also more food intolerances. She's also felt more constipated and incomplete defecation. She reports that she simply cannot eat salads and raw vegetables. This leads to right sided and lower right-sided abdominal pain which is crampy in nature. She hasn't tried Levsin. She's been on a probiotic daily without much benefit. She's having a bowel movement nearly daily but stool still somewhat harder to pass an incomplete. Doubt one day a week she will have a "really good bowel movement" and feel relief. No blood in her stool or melena.  She has continued Pepcid 20 mg twice a day without dysphagia, odynophagia or significant heartburn. She occasionally will have  nocturnal reflux which she attributes to eating late at night.  Her last colonoscopy was November 2009 which showed a redundant and tortuous colon with left-sided diverticulosis. No polyps were seen.   Review of Systems    As per history of present illness, otherwise negative  Current Medications, Allergies, Past Medical History, Past Surgical History, Family History and Social History were reviewed in [employer] record.    Objective:   Physical Exam BP 134/80   Pulse 82   Ht 5' 3.5" (1.613 m)   Wt 152 lb 9.6 oz (69.2 kg)   BMI 26.61 kg/m  Constitutional: Well-developed and well-nourished. No distress. HEENT: Normocephalic and atraumatic. Oropharynx is clear and moist. Conjunctivae are normal.  No scleral icterus. Neck: Neck supple. Trachea midline. Cardiovascular: Normal rate, regular rhythm and intact distal pulses. No M/R/G Pulmonary/chest: Effort normal and breath sounds normal. No wheezing, rales or rhonchi. Abdominal: Soft, lower, bilateral, abdominal discomfort to deep palpation without rebound or guarding, nondistended. Bowel sounds active throughout. There are no masses palpable. No hepatosplenomegaly. Extremities: no clubbing, cyanosis, or edema Neurological: Alert and oriented to person place and time. Skin: Skin is warm and dry. Psychiatric: Normal mood and affect. Behavior is normal.  CBC    Component Value Date/Time   WBC 8.2 07/31/2014 0551   RBC 3.56 (L) 07/31/2014 0551   HGB 11.5 (L) 07/31/2014 0551   HCT 33.7 (L) 07/31/2014 0551   PLT 238 07/31/2014 0551   MCV 94.7 07/31/2014 0551   MCH 32.3 07/31/2014 0551   MCHC 34.1 07/31/2014 0551  RDW 13.0 07/31/2014 0551   LYMPHSABS 1.2 01/20/2009 0508   MONOABS 0.9 01/20/2009 0508   EOSABS 0.1 01/20/2009 0508   BASOSABS 0.1 01/20/2009 0508   CMP     Component Value Date/Time   NA 141 11/22/2015 0830   K 4.0 11/22/2015 0830   CL 104 11/22/2015 0830   CO2 29 11/22/2015 0830    GLUCOSE 86 11/22/2015 0830   BUN 13 11/22/2015 0830   CREATININE 0.69 11/22/2015 0830   CALCIUM 9.0 11/22/2015 0830   PROT 7.3 06/05/2010   ALBUMIN 4.1 06/05/2010   AST 30 06/05/2010   ALT 30 06/05/2010   ALKPHOS 56 06/05/2010   BILITOT 0.9 06/05/2010   GFRNONAA >60 11/22/2015 0830   GFRAA >60 11/22/2015 0830   CT ABDOMEN AND PELVIS WITHOUT CONTRAST   TECHNIQUE: Multidetector CT imaging of the abdomen and pelvis was performed following the standard protocol without IV contrast.   COMPARISON:  None.   FINDINGS: Lower chest: The visualized lung bases are grossly clear. The visualized portions of the mediastinum are unremarkable.   Hepatobiliary: The liver is unremarkable in appearance. Likely tiny stones are seen dependently within the gallbladder. The gallbladder is otherwise unremarkable. The common bile duct remains normal in caliber.   Pancreas: The pancreas is within normal limits.   Spleen: The spleen is unremarkable in appearance.   Adrenals/Urinary Tract: The adrenal glands are unremarkable in appearance. The kidneys are within normal limits. There is no evidence of hydronephrosis. No renal or ureteral stones are identified. No perinephric stranding is seen.   Stomach/Bowel: The stomach is unremarkable in appearance. The patient is status post Nissen fundoplication. The small bowel is within normal limits. The patient is status post appendectomy.   Mild wall thickening is noted along the mid sigmoid colon, with associated soft tissue inflammation and trace fluid, concerning for acute diverticulitis. Diverticulosis is noted along the sigmoid colon. There is no evidence of perforation or abscess formation at this time.   Vascular/Lymphatic: Scattered calcification is seen along the distal abdominal aorta and its branches. The abdominal aorta is otherwise grossly unremarkable. The inferior vena cava is grossly unremarkable. No retroperitoneal lymphadenopathy is  seen. No pelvic sidewall lymphadenopathy is identified.   Reproductive: The bladder is mildly distended and grossly unremarkable. The patient is status post hysterectomy. No suspicious adnexal masses are seen.   Other: No additional soft tissue abnormalities are seen.   Musculoskeletal: No acute osseous abnormalities are identified. Vacuum phenomenon is noted at L3-L4. The visualized musculature is unremarkable in appearance.   IMPRESSION: 1. Likely acute diverticulitis at the mid sigmoid colon, with mild wall thickening, soft tissue inflammation and trace fluid. Diverticulosis along the sigmoid colon. No evidence of perforation or abscess formation at this time. 2. Likely minimal cholelithiasis noted. 3. Scattered aortic atherosclerosis.     Electronically Signed   By: Garald Balding M.D.   On: 12/19/2016 23:52       Assessment & Plan:  66 year old female with history of GERD with Barrett's esophagus status post Nissen fundoplication and redo fundoplication, history of colonic diverticulosis and recent diverticulitis, IBS who is here for follow-up.  1. Recent sigmoid diverticulitis/symptomatic diverticular disease -- she had CT scan evidence of sigmoid diverticulitis despite her symptoms being right-sided. She was treated appropriately with antibiotics and the pain did improve. Her symptoms over the last 6 months sound like symptomatic diverticulosis to me. I recommended colonoscopy to exclude colitis, neoplasm, etc. We discussed the risks, benefits and alternatives and she wishes  to proceed. Her left colon was extremely tortuous at the time of her last colonoscopy and I feel that her pain, incomplete defecation, etc. is likely related to colonic narrowing and poor distensibility due to symptomatic diverticulosis. She very well may benefit from sigmoid resection but we will discuss this more after her colonoscopy. It should be noted that her symptoms of diverticulitis were arrived  lower abdomen despite CT evidence of sigmoid diverticulitis. Levsin 0.125, 1-2 tablets every 6-8 hours as needed for crampy lower abdominal pain.   2. GERD with hx of Barrett's esophagus -- no significant reflux symptoms and no dysphagia. Last EGD was 3 years ago. I recommended we repeat EGD for Barrett's surveillance. We discussed the risks, benefits and alternatives and she wishes to proceed. She will continue Pepcid 20 mg twice a day.  25 minutes spent with the patient today. Greater than 50% was spent in counseling and coordination of care with the patient

## 2017-02-13 ENCOUNTER — Telehealth: Payer: Self-pay

## 2017-02-13 ENCOUNTER — Telehealth: Payer: Self-pay | Admitting: Internal Medicine

## 2017-02-13 NOTE — Telephone Encounter (Signed)
A prior auth request was sent yesterday and we are awaiting response from insurance right now.

## 2017-02-13 NOTE — Telephone Encounter (Signed)
I have advised patient that a prior authorization was requested for this medication yesterday and we are awaiting a response from insurance. However, she is having abdominal pain/cramping so may be best to give her something else since we seem to be at a standstill with insurance... Any advice?

## 2017-02-13 NOTE — Telephone Encounter (Signed)
See previous phone note from earlier today  

## 2017-02-13 NOTE — Telephone Encounter (Signed)
Patient calling in regarding this. She is requesting callback regarding this medication. She states that there are two other ways that this med can be dispensed?? Best # [phone]

## 2017-02-14 MED ORDER — DICYCLOMINE HCL 20 MG PO TABS: 20.0000 mg | ORAL_TABLET | Freq: Three times a day (TID) | ORAL | 2 refills | Status: DC | PRN

## 2017-02-14 NOTE — Addendum Note (Signed)
Addended by: Larina Bras on: 02/14/2017 02:42 PM   Modules accepted: Orders

## 2017-02-14 NOTE — Telephone Encounter (Signed)
Patient advised that Dr Hilarie Fredrickson has okayed bentyl in place of hyoscyamine (at this time, insurance has now denied hyoscyamine). Rx has been sent to pharmacy. Patient verbalizes understanding.

## 2017-02-14 NOTE — Telephone Encounter (Signed)
Try bentyl 20 mg TID PRN -- abd pain, cramping, spasm

## 2017-02-24 ENCOUNTER — Ambulatory Visit
Admission: RE | Admit: 2017-02-24 | Discharge: 2017-02-24 | Disposition: A | Discharge status: Home / Self Care | Payer: BLUE CROSS/BLUE SHIELD | Source: Ambulatory Visit | Attending: Internal Medicine | Admitting: Internal Medicine

## 2017-02-24 DIAGNOSIS — Z1231 Encounter for screening mammogram for malignant neoplasm of breast: Secondary | ICD-10-CM

## 2017-03-04 ENCOUNTER — Other Ambulatory Visit: Payer: Self-pay | Admitting: Dermatology

## 2017-03-10 ENCOUNTER — Encounter: Payer: Self-pay | Admitting: Internal Medicine

## 2017-03-21 ENCOUNTER — Ambulatory Visit (AMBULATORY_SURGERY_CENTER): Payer: BLUE CROSS/BLUE SHIELD | Admitting: Internal Medicine

## 2017-03-21 ENCOUNTER — Encounter: Payer: Self-pay | Admitting: Internal Medicine

## 2017-03-21 VITALS — BP 133/74 | HR 74 | Temp 97.3°F | Resp 12 | Ht 63.5 in | Wt 152.0 lb

## 2017-03-21 DIAGNOSIS — K5730 Diverticulosis of large intestine without perforation or abscess without bleeding: Secondary | ICD-10-CM | POA: Diagnosis not present

## 2017-03-21 DIAGNOSIS — K2270 Barrett's esophagus without dysplasia: Secondary | ICD-10-CM | POA: Diagnosis not present

## 2017-03-21 MED ORDER — SODIUM CHLORIDE 0.9 % IV SOLN: 500.0000 mL | INTRAVENOUS | Status: DC

## 2017-03-21 NOTE — Progress Notes (Signed)
Called to room to assist during endoscopic procedure.  Patient ID and intended procedure confirmed with present staff. Received instructions for my participation in the procedure from the performing physician.  

## 2017-03-21 NOTE — Op Note (Signed)
[place] Patient Name: [name] Procedure Date: 03/21/2017 2:29 PM MRN: [id number] Endoscopist: Jerene Bears , MD Age: 66 Referring MD:  Date of Birth: 11-May-1951 Gender: Female Account #: [id number] Procedure:                Upper GI endoscopy Indications:              Surveillance for malignancy due to personal history                            of Barrett's esophagus Medicines:                Monitored Anesthesia Care Procedure:                Pre-Anesthesia Assessment:                           - Prior to the procedure, a History and Physical                            was performed, and patient medications and                            allergies were reviewed. [name] tolerance of                            previous anesthesia was also reviewed. The risks                            and benefits of the procedure and the sedation                            options and risks were discussed with the patient.                            All questions were answered, and informed consent                            was obtained. Prior Anticoagulants: The patient has                            taken no previous anticoagulant or antiplatelet                            agents. ASA Grade Assessment: II - A patient with                            mild systemic disease. After reviewing the risks                            and benefits, the patient was deemed in                            satisfactory condition to undergo the procedure.  After obtaining informed consent, the endoscope was                            passed under direct vision. Throughout the                            procedure, the patient's blood pressure, pulse, and                            oxygen saturations were monitored continuously. The                            Endoscope was introduced through the mouth, and                            advanced to the second part of  duodenum. The upper                            GI endoscopy was accomplished without difficulty.                            The patient tolerated the procedure well. Scope In: Scope Out: Findings:                 The Z-line was very slightly irregular and was                            found 37 cm from the incisors. Given personal                            history of Barrett's this was biopsied with a cold                            forceps for evaluation to rule out Barrett's                            Esophagus.                           The exam of the esophagus was otherwise normal.                           Evidence of a Nissen fundoplication was found in                            the cardia. The wrap appeared intact.                           The entire examined stomach was normal.                           The examined duodenum was normal. Complications:            No immediate complications. Estimated Blood Loss:     Estimated blood loss was minimal. Impression:               -  Slightly Z-line irregular, 37 cm from the                            incisors. Biopsied.                           - A Nissen fundoplication was found. The wrap                            appears intact.                           - Normal stomach.                           - Normal examined duodenum. Recommendation:           - Patient has a contact number available for                            emergencies. The signs and symptoms of potential                            delayed complications were discussed with the                            patient. Return to normal activities tomorrow.                            Written discharge instructions were provided to the                            patient.                           - Resume previous diet.                           - Continue present medications.                           - Await pathology results. Jerene Bears, MD 03/21/2017 3:10:12  PM This report has been signed electronically.

## 2017-03-21 NOTE — Progress Notes (Signed)
To recovery, report to Jones, RN, VSS 

## 2017-03-21 NOTE — Op Note (Signed)
[place] Patient Name: [name] Procedure Date: 03/21/2017 2:45 PM MRN: [id number] Endoscopist: Jerene Bears , MD Age: 66 Referring MD:  Date of Birth: 08/25/1951 Gender: Female Account #: [id number] Procedure:                Colonoscopy Indications:              Diverticulosis of the colon, Follow-up of                            diverticulitis, symptomatic diverticular disease Medicines:                Monitored Anesthesia Care Procedure:                Pre-Anesthesia Assessment:                           - Prior to the procedure, a History and Physical                            was performed, and patient medications and                            allergies were reviewed. [name] tolerance of                            previous anesthesia was also reviewed. The risks                            and benefits of the procedure and the sedation                            options and risks were discussed with the patient.                            All questions were answered, and informed consent                            was obtained. Prior Anticoagulants: The patient has                            taken no previous anticoagulant or antiplatelet                            agents. ASA Grade Assessment: II - A patient with                            mild systemic disease. After reviewing the risks                            and benefits, the patient was deemed in                            satisfactory condition to undergo the procedure.                           -  Prior to the procedure, a History and Physical                            was performed, and patient medications and                            allergies were reviewed. [name] tolerance of                            previous anesthesia was also reviewed. The risks                            and benefits of the procedure and the sedation                            options and risks were discussed  with the patient.                            All questions were answered, and informed consent                            was obtained. Prior Anticoagulants: The patient has                            taken no previous anticoagulant or antiplatelet                            agents. ASA Grade Assessment: II - A patient with                            mild systemic disease. After reviewing the risks                            and benefits, the patient was deemed in                            satisfactory condition to undergo the procedure.                           After obtaining informed consent, the colonoscope                            was passed under direct vision. Throughout the                            procedure, the patient's blood pressure, pulse, and                            oxygen saturations were monitored continuously. The                            Model PCF-[id number]) scope was introduced  through the anus and advanced to the the cecum,                            identified by appendiceal orifice and ileocecal                            valve. The colonoscopy was performed without                            difficulty. The patient tolerated the procedure                            well. The quality of the bowel preparation was                            good. The ileocecal valve, appendiceal orifice, and                            rectum were photographed. Scope In: 2:47:16 PM Scope Out: 3:03:10 PM Scope Withdrawal Time: 0 hours 7 minutes 6 seconds  Total Procedure Duration: 0 hours 15 minutes 54 seconds  Findings:                 The digital rectal exam was normal.                           Multiple small and large-mouthed diverticula were                            found in the sigmoid colon. The colon was tortuous                            and angulated in this segment.                           The exam was otherwise without  abnormality on                            direct and retroflexion views. Complications:            No immediate complications. Estimated Blood Loss:     Estimated blood loss: none. Impression:               - Severe diverticulosis in the sigmoid colon. The                            colon was tortuous and angulated in this segment.                           - The examination was otherwise normal on direct                            and retroflexion views.                           - No evidence  of colitis or polyps.                           - No specimens collected. Recommendation:           - Patient has a contact number available for                            emergencies. The signs and symptoms of potential                            delayed complications were discussed with the                            patient. Return to normal activities tomorrow.                            Written discharge instructions were provided to the                            patient.                           - Resume previous diet.                           - Continue present medications.                           - Repeat colonoscopy in 10 years for screening                            purposes.                           - Refer to a surgeon at appointment to be scheduled                            to discuss sigmoidectomy for symptomatic                            diverticular disease. Jerene Bears, MD 03/21/2017 3:17:19 PM This report has been signed electronically.

## 2017-03-21 NOTE — Patient Instructions (Signed)
YOU HAD AN ENDOSCOPIC PROCEDURE TODAY AT [facility]:   Refer to the procedure report that was given to you for any specific questions about what was found during the examination.  If the procedure report does not answer your questions, please call your gastroenterologist to clarify.  If you requested that your care partner not be given the details of your procedure findings, then the procedure report has been included in a sealed envelope for you to review at your convenience later.  YOU SHOULD EXPECT: Some feelings of bloating in the abdomen. Passage of more gas than usual.  Walking can help get rid of the air that was put into your GI tract during the procedure and reduce the bloating. If you had a lower endoscopy (such as a colonoscopy or flexible sigmoidoscopy) you may notice spotting of blood in your stool or on the toilet paper. If you underwent a bowel prep for your procedure, you may not have a normal bowel movement for a few days.  Please Note:  You might notice some irritation and congestion in your nose or some drainage.  This is from the oxygen used during your procedure.  There is no need for concern and it should clear up in a day or so.  SYMPTOMS TO REPORT IMMEDIATELY:   Following lower endoscopy (colonoscopy or flexible sigmoidoscopy):  Excessive amounts of blood in the stool  Significant tenderness or worsening of abdominal pains  Swelling of the abdomen that is new, acute  Fever of 100F or higher   Following upper endoscopy (EGD)  Vomiting of blood or coffee ground material  New chest pain or pain under the shoulder blades  Painful or persistently difficult swallowing  New shortness of breath  Fever of 100F or higher  Black, tarry-looking stools  For urgent or emergent issues, a gastroenterologist can be reached at any hour by calling [phone].   DIET:  We do recommend a small meal at first, but then you may proceed to your regular diet.  Drink  plenty of fluids but you should avoid alcoholic beverages for 24 hours.  ACTIVITY:  You should plan to take it easy for the rest of today and you should NOT DRIVE or use heavy machinery until tomorrow (because of the sedation medicines used during the test).    FOLLOW UP: Our staff will call the number listed on your records the next business day following your procedure to check on you and address any questions or concerns that you may have regarding the information given to you following your procedure. If we do not reach you, we will leave a message.  However, if you are feeling well and you are not experiencing any problems, there is no need to return our call.  We will assume that you have returned to your regular daily activities without incident.  If any biopsies were taken you will be contacted by phone or by letter within the next 1-3 weeks.  Please call us at [phone] if you have not heard about the biopsies in 3 weeks.   Await for biopsy results results Continue previous medications Repeat Colonoscopy screening in 10 years Diverticulosis (handout given) Refer to a surgeon at appointment to be scheduled to discuss sigmoidectomy for symptomatic diverticular disease   SIGNATURES/CONFIDENTIALITY: You and/or your care partner have signed paperwork which will be entered into your electronic medical record.  These signatures attest to the fact that that the information above on your After Visit Summary  has been reviewed and is understood.  Full responsibility of the confidentiality of this discharge information lies with you and/or your care-partner. 

## 2017-03-24 ENCOUNTER — Telehealth: Payer: Self-pay | Admitting: *Deleted

## 2017-03-24 NOTE — Telephone Encounter (Signed)
  Follow up Call-  Call back number 03/21/2017  Post procedure Call Back phone  # [phone]  Permission to leave phone message Yes  Some recent data might be hidden     Patient questions:  Do you have a fever, pain , or abdominal swelling? No. Pain Score  0 *  Have you tolerated food without any problems? Yes.    Have you been able to return to your normal activities? Yes.    Do you have any questions about your discharge instructions: Diet   No. Medications  No. Follow up visit  No.  Do you have questions or concerns about your Care? No.  Actions: * If pain score is 4 or above: No action needed, pain <4.

## 2017-03-25 ENCOUNTER — Telehealth: Payer: Self-pay

## 2017-03-25 NOTE — Telephone Encounter (Signed)
Pt scheduled to see Dr. Johney Maine 04/15/17 @11am , arrival time 10:45am.

## 2017-03-25 NOTE — Telephone Encounter (Signed)
Referral faxed to [facility] for appt to discuss sigmoidectomy for symptomatic diverticular disease.

## 2017-04-01 ENCOUNTER — Encounter: Payer: Self-pay | Admitting: Internal Medicine

## 2017-04-15 ENCOUNTER — Ambulatory Visit: Payer: Self-pay | Admitting: Surgery

## 2017-04-15 NOTE — H&P (Signed)
[name] 04/15/2017 11:07 AM Location: [facility] Patient #: [id number] DOB: Sep 25, 1951 Married / Language: English / Race: White Female  Patient Care Team: Eber Hong, MD as PCP - General (Internal Medicine)  History of Present Illness Charlotte Hector MD; 04/15/2017 12:12 PM) The patient is a 66 year old female who presents with diverticulitis. Note for "Diverticulitis": ` ` ` Patient sent for surgical consultation at the request of Dr. Zenovia Jarred, [facility]  Chief Complaint: Recurrent sigmoid diverticulitis with chronic pain  The patient is a pleasant female that has had attacks of sigmoid diverticulitis in the past. She is pretty certain she was diagnosed with irritable bowel syndrome many many years ago. Occasionally has a regular bowels but tends to move her bowels every day. She's had episodes of diverticulitis where she will get sharp very low back pain that radiates to her suprapubic region. Usually resolves with antibiotics. The point she cannot tolerate Cipro anymore. She said semi-attacks she can remember. She does not recall episode of perforation or needing a drain placement. Usually they are anointed not severe and she tries to read them out.  She had an episode of very intense pain that scared her 3 months ago. She went to the emergency room to confirm diverticulitis. Nothing else was noted. She followed up with gastroenterology. She was due for a 10 year follow-up endoscopy. Endoscopy showed severe diverticulosis in her sigmoid colon with some tortuousness. However no other abnormalities noted. Because of her frequent attacks, lack of any other etiology for her symptoms, and now chronic soreness, surgical consultation recommended for elective colectomy.  Patient notes that she usually can control her bowels with a line probiotic. She is poor tolerance to any shells or seeds. Has to focus on bland foods. Can tolerate most proteins and soft well  cooked peeled vegetables. Has some low back pain issues controlled with a chiropractor. That is been stable. Of a large hiatal hernia was repaired by my partner, Dr. Hassell Done about 10 years ago. She had a bad coughing wrenching episode with pneumonia that broke it down. She had a redo fundoplication done in 2951. She recovered from that well. Denies any dysphagia or other issues from that.  (Review of systems as stated in this history (HPI) or in the review of systems. Otherwise all other 12 point ROS are negative)   Allergies Dalbert Mayotte, CMA; 04/15/2017 11:08 AM) Codeine/Codeine Derivatives Tetracyclines Alendronate Sodium *ENDOCRINE AND METABOLIC AGENTS - MISC.* Ciprofloxacin *FLUOROQUINOLONES* Allergies Reconciled  Medication History Dalbert Mayotte, CMA; 04/15/2017 11:12 AM) TraMADol HCl (50MG  Tablet, Oral) Active. Fish Oil Concentrate (435MG  Capsule, 1 (one) Oral) Active. Magnesium (250MG  Tablet, Oral) Active. Align (Oral) Active. Pepcid (10MG /ML Solution, Intravenous) Active. Baby Aspirin (81MG  Tablet Chewable, Oral) Active. ValACYclovir HCl (1GM Tablet, Oral) Active. Raloxifene HCl (60MG  Tablet, Oral) Active. Calcium (600MG  Capsule, Oral) Active. Tylenol (1 (one) Oral) Specific strength unknown - Active. Dicyclomine HCl (20MG  Tablet, Oral) Active. Azithromycin (250MG  Tablet, Oral) Active. Cefuroxime Axetil (250MG  Tablet, Oral) Active. Hydrochlorothiazide (25MG  Tablet, Oral) Active. Levothyroxine Sodium (88MCG Tablet, Oral) Active. Ondansetron HCl (4MG  Tablet, Oral) Active. Promethegan (25MG  Suppository, Rectal) Active. Xifaxan (550MG  Tablet, Oral) Active. Medications Reconciled    Vitals Dalbert Mayotte CMA; 04/15/2017 11:12 AM) 04/15/2017 11:12 AM Weight: 150 lb Height: 63in Body Surface Area: 1.71 m Body Mass Index: 26.57 kg/m  Temp.: 97.57F  Pulse: 101 (Regular)  BP: 142/90 (Sitting, Left Arm, Standard)      Physical  Exam Charlotte Hector MD; 04/15/2017  12:13 PM)  General Mental Status-Alert. General Appearance-Not in acute distress, Not Sickly. Orientation-Oriented X3. Hydration-Well hydrated. Voice-Normal. Note: Sitting around smiling. Pleasant.  Integumentary Global Assessment Upon inspection and palpation of skin surfaces of the - Axillae: non-tender, no inflammation or ulceration, no drainage. and Distribution of scalp and body hair is normal. General Characteristics Temperature - normal warmth is noted.  Head and Neck Head-normocephalic, atraumatic with no lesions or palpable masses. Face Global Assessment - atraumatic, no absence of expression. Neck Global Assessment - no abnormal movements, no bruit auscultated on the right, no bruit auscultated on the left, no decreased range of motion, non-tender. Trachea-midline. Thyroid Gland Characteristics - non-tender.  Eye Eyeball - Left-Extraocular movements intact, No Nystagmus. Eyeball - Right-Extraocular movements intact, No Nystagmus. Cornea - Left-No Hazy. Cornea - Right-No Hazy. Sclera/Conjunctiva - Left-No scleral icterus, No Discharge. Sclera/Conjunctiva - Right-No scleral icterus, No Discharge. Pupil - Left-Direct reaction to light normal. Pupil - Right-Direct reaction to light normal.  ENMT Ears Pinna - Left - no drainage observed, no generalized tenderness observed. Right - no drainage observed, no generalized tenderness observed. Nose and Sinuses External Inspection of the Nose - no destructive lesion observed. Inspection of the nares - Left - quiet respiration. Right - quiet respiration. Mouth and Throat Lips - Upper Lip - no fissures observed, no pallor noted. Lower Lip - no fissures observed, no pallor noted. Nasopharynx - no discharge present. Oral Cavity/Oropharynx - Tongue - no dryness observed. Oral Mucosa - no cyanosis observed. Hypopharynx - no evidence of airway distress  observed.  Chest and Lung Exam Inspection Movements - Normal and Symmetrical. Accessory muscles - No use of accessory muscles in breathing. Palpation Palpation of the chest reveals - Non-tender. Auscultation Breath sounds - Normal and Clear.  Cardiovascular Auscultation Rhythm - Regular. Murmurs & Other Heart Sounds - Auscultation of the heart reveals - No Murmurs and No Systolic Clicks.  Abdomen Inspection Inspection of the abdomen reveals - No Visible peristalsis and No Abnormal pulsations. Umbilicus - No Bleeding, No Urine drainage. Palpation/Percussion Palpation and Percussion of the abdomen reveal - Soft, Non Tender, No Rebound tenderness, No Rigidity (guarding) and No Cutaneous hyperesthesia. Note: Abdomen soft. Sensitive soreness especially in suprapubic region and slightly right lower quadrant. Less so on the left side. No diffuse peritonitis. No diastases. Old laparoscopic port sites clean without hernia. Not severely distended. No distasis recti. No umbilical or other anterior abdominal wall hernias  Female Genitourinary Sexual Maturity Tanner 5 - Adult hair pattern. Note: No vaginal bleeding nor discharge  Peripheral Vascular Upper Extremity Inspection - Left - No Cyanotic nailbeds, Not Ischemic. Right - No Cyanotic nailbeds, Not Ischemic.  Neurologic Neurologic evaluation reveals -normal attention span and ability to concentrate, able to name objects and repeat phrases. Appropriate fund of knowledge , normal sensation and normal coordination. Mental Status Affect - not angry, not paranoid. Cranial Nerves-Normal Bilaterally. Gait-Normal.  Neuropsychiatric Mental status exam performed with findings of-able to articulate well with normal speech/language, rate, volume and coherence, thought content normal with ability to perform basic computations and apply abstract reasoning and no evidence of hallucinations, delusions, obsessions or homicidal/suicidal  ideation.  Musculoskeletal Global Assessment Spine, Ribs and Pelvis - no instability, subluxation or laxity. Right Upper Extremity - no instability, subluxation or laxity.  Lymphatic Head & Neck  General Head & Neck Lymphatics: Bilateral - Description - No Localized lymphadenopathy. Axillary  General Axillary Region: Bilateral - Description - No Localized lymphadenopathy. Femoral & Inguinal  Generalized Femoral & Inguinal  Lymphatics: Left - Description - No Localized lymphadenopathy. Right - Description - No Localized lymphadenopathy.    Assessment & Plan Charlotte Hector MD; 04/15/2017 12:14 PM)  DIVERTICULITIS OF LARGE INTESTINE WITHOUT PERFORATION OR ABSCESS WITHOUT BLEEDING (K57.32) Impression: Pleasant woman with chronic lower abdominal and back pain flares and known sigmoid diverticulitis. A prior usually mild attacks with one severe attack 3 months ago. It is not really resolved. She's had chronic soreness for the past 3 months. Nothing else in the differential diagnosis suspicious. Evidence of severe diverticulosis and some torturous nest suspicious for chronic stricturing as well.  I think she would benefit from segmental colonic resection. Sigmoid colectomy. Good minimally invasive candidate. Robotically-assisted. She is interested in proceeding. She has excellent exercise tolerance. Stable left bundle branch block but otherwise no cardiopulmonary issues. We will to coordinate convenient time.  PREOP COLON - ENCOUNTER FOR PREOPERATIVE EXAMINATION FOR GENERAL SURGICAL PROCEDURE (Z01.818)  Current Plans You are being scheduled for surgery- Our schedulers will call you.  You should hear from our office's scheduling department within 5 working days about the location, date, and time of surgery. We try to make accommodations for patient's preferences in scheduling surgery, but sometimes the OR schedule or the surgeon's schedule prevents [place] from making those accommodations.  If  you have not heard from our office [phone]) in 5 working days, call the office and ask for your surgeon's nurse.  If you have other questions about your diagnosis, plan, or surgery, call the office and ask for your surgeon's nurse.  Written instructions provided Pt Education - Pamphlet Given - Laparoscopic Colorectal Surgery: discussed with patient and provided information. Pt Education - [facility] Colon Bowel Prep 2015 Miralax/Antibiotics Started Neomycin Sulfate 500MG , 2 (two) Tablet SEE NOTE, #6, 04/15/2017, No Refill. Local Order: TAKE TWO TABLETS AT 2 PM, 3 PM, AND 10 PM THE DAY PRIOR TO SURGERY Started Flagyl 500MG , 2 (two) Tablet SEE NOTE, #6, 04/15/2017, No Refill. Local Order: Take at 2pm, 3pm, and 10pm the day prior to your colon operation The anatomy & physiology of the digestive tract was discussed. The pathophysiology of the colon was discussed. Natural history risks without surgery was discussed. I feel the risks of no intervention will lead to serious problems that outweigh the operative risks; therefore, I recommended a partial colectomy to remove the pathology. Minimally invasive (Robotic/Laparoscopic) & open techniques were discussed.  Risks such as bleeding, infection, abscess, leak, reoperation, possible ostomy, hernia, heart attack, death, and other risks were discussed. I noted a good likelihood this will help address the problem. Goals of post-operative recovery were discussed as well. Need for adequate nutrition, daily bowel regimen and healthy physical activity, to optimize recovery was noted as well. We will work to minimize complications. Educational materials were available as well. Questions were answered. The patient expresses understanding & wishes to proceed with surgery.  Pt Education - [facility] Colectomy post-op instructions: discussed with patient and provided information. IRRITABLE BOWEL SYNDROME WITH DIARRHEA (K58.0) Impression: Irregular bowels. Most  likely exacerbated with repeated diverticulitis and tortuous no some possible rectal stricture.  I am guarded against the idea this will resolve after surgery, but I'm hopeful her bowels will be easier to manage with this. Continue probiotics and long-term management per her gastroenterologist.  Current Plans Pt Education - [facility] IBS patient info: discussed with patient and provided information. Pt Education - [facility] Good Bowel Health (Latonya Knight)  [name], M.D., F.A.C.S. Gastrointestinal and Minimally Invasive Surgery [facility] [phone]  [address] [place], [place] [address] [phone] Main / Paging

## 2017-05-27 DIAGNOSIS — M542 Cervicalgia: Secondary | ICD-10-CM

## 2017-05-27 DIAGNOSIS — G8929 Other chronic pain: Secondary | ICD-10-CM | POA: Diagnosis present

## 2017-05-29 NOTE — Patient Instructions (Addendum)
[name]  05/29/2017   Your procedure is scheduled on: 06-06-17   Report to [facility] Main  Entrance Take [place]  Elevators to 3rd floor to  [place] at 5:30 AM.    Call this number if you have problems the morning of surgery [phone]    Remember: ONLY 1 PERSON MAY GO WITH YOU TO SHORT STAY TO GET  READY MORNING OF [place].  Drink clear liquids the day of prep to prevent dehydration. Do not eat food or drink liquids :After Midnight.     CLEAR LIQUID DIET   Foods Allowed                                                                     Foods Excluded  Coffee and tea, regular and decaf                             liquids that you cannot  Plain Jell-O in any flavor                                             see through such as: Fruit ices (not with fruit pulp)                                     milk, soups, orange juice  Iced Popsicles                                    All solid food Carbonated beverages, regular and diet                                    Cranberry, grape and apple juices Sports drinks like Gatorade Lightly seasoned clear broth or consume(fat free) Sugar, honey syrup  Sample Menu Breakfast                                Lunch                                     Supper Cranberry juice                    Beef broth                            Chicken broth Jell-O                                     Grape juice  Apple juice Coffee or tea                        Jell-O                                      Popsicle                                                Coffee or tea                        Coffee or tea  _____________________________________________________________________     Take these medicines the morning of surgery with A SIP OF WATER: Famotidine (Pepcid), and Levothyroxine (Synthroid),                                 You may not have any metal on your body including hair pins  and              piercings  Do not wear jewelry, make-up, lotions, powders or perfumes, deodorant             Do not wear nail polish.  Do not shave  48 hours prior to surgery.                 Do not bring valuables to the hospital. [place].  Contacts, dentures or bridgework may not be worn into surgery.  Leave suitcase in the car. After surgery it may be brought to your room.     Please read over the following fact sheets you were given: _____________________________________________________________________             [facility] - Preparing for Surgery Before surgery, you can play an important role.  Because skin is not sterile, your skin needs to be as free of germs as possible.  You can reduce the number of germs on your skin by washing with CHG (chlorahexidine gluconate) soap before surgery.  CHG is an antiseptic cleaner which kills germs and bonds with the skin to continue killing germs even after washing. Please DO NOT use if you have an allergy to CHG or antibacterial soaps.  If your skin becomes reddened/irritated stop using the CHG and inform your nurse when you arrive at Short Stay. Do not shave (including legs and underarms) for at least 48 hours prior to the first CHG shower.  You may shave your face/neck. Please follow these instructions carefully:  1.  Shower with CHG Soap the night before surgery and the  morning of Surgery.  2.  If you choose to wash your hair, wash your hair first as usual with your  normal  shampoo.  3.  After you shampoo, rinse your hair and body thoroughly to remove the  shampoo.                           4.  Use CHG as you would any other liquid soap.  You can apply chg directly  to the skin and  wash                       Gently with a scrungie or clean washcloth.  5.  Apply the CHG Soap to your body ONLY FROM THE NECK DOWN.   Do not use on face/ open                           Wound or open sores.  Avoid contact with eyes, ears mouth and genitals (private parts).                       Wash face,  Genitals (private parts) with your normal soap.             6.  Wash thoroughly, paying special attention to the area where your surgery  will be performed.  7.  Thoroughly rinse your body with warm water from the neck down.  8.  DO NOT shower/wash with your normal soap after using and rinsing off  the CHG Soap.                9.  Pat yourself dry with a clean towel.            10.  Wear clean pajamas.            11.  Place clean sheets on your bed the night of your first shower and do not  sleep with pets. Day of Surgery : Do not apply any lotions/deodorants the morning of surgery.  Please wear clean clothes to the hospital/surgery center.  FAILURE TO FOLLOW THESE INSTRUCTIONS MAY RESULT IN THE CANCELLATION OF YOUR SURGERY PATIENT SIGNATURE_________________________________  NURSE SIGNATURE__________________________________  ________________________________________________________________________  WHAT IS A BLOOD TRANSFUSION? Blood Transfusion Information  A transfusion is the replacement of blood or some of its parts. Blood is made up of multiple cells which provide different functions.  Red blood cells carry oxygen and are used for blood loss replacement.  White blood cells fight against infection.  Platelets control bleeding.  Plasma helps clot blood.  Other blood products are available for specialized needs, such as hemophilia or other clotting disorders. BEFORE THE TRANSFUSION  Who gives blood for transfusions?   Healthy volunteers who are fully evaluated to make sure their blood is safe. This is blood bank blood. Transfusion therapy is the safest it has ever been in the practice of medicine. Before blood is taken from a donor, a complete history is taken to make sure that person has no history of diseases nor engages in risky social behavior (examples are intravenous drug use  or sexual activity with multiple partners). The donor's travel history is screened to minimize risk of transmitting infections, such as malaria. The donated blood is tested for signs of infectious diseases, such as HIV and hepatitis. The blood is then tested to be sure it is compatible with you in order to minimize the chance of a transfusion reaction. If you or a relative donates blood, this is often done in anticipation of surgery and is not appropriate for emergency situations. It takes many days to process the donated blood. RISKS AND COMPLICATIONS Although transfusion therapy is very safe and saves many lives, the main dangers of transfusion include:   Getting an infectious disease.  Developing a transfusion reaction. This is an allergic reaction to something in the blood you were given. Every precaution is taken to prevent this. The decision  to have a blood transfusion has been considered carefully by your caregiver before blood is given. Blood is not given unless the benefits outweigh the risks. AFTER THE TRANSFUSION  Right after receiving a blood transfusion, you will usually feel much better and more energetic. This is especially true if your red blood cells have gotten low (anemic). The transfusion raises the level of the red blood cells which carry oxygen, and this usually causes an energy increase.  The nurse administering the transfusion will monitor you carefully for complications. HOME CARE INSTRUCTIONS  No special instructions are needed after a transfusion. You may find your energy is better. Speak with your caregiver about any limitations on activity for underlying diseases you may have. SEEK MEDICAL CARE IF:   Your condition is not improving after your transfusion.  You develop redness or irritation at the intravenous (IV) site. SEEK IMMEDIATE MEDICAL CARE IF:  Any of the following symptoms occur over the next 12 hours:  Shaking chills.  You have a temperature by mouth  above 102 F (38.9 C), not controlled by medicine.  Chest, back, or muscle pain.  People around you feel you are not acting correctly or are confused.  Shortness of breath or difficulty breathing.  Dizziness and fainting.  You get a rash or develop hives.  You have a decrease in urine output.  Your urine turns a dark color or changes to pink, red, or brown. Any of the following symptoms occur over the next 10 days:  You have a temperature by mouth above 102 F (38.9 C), not controlled by medicine.  Shortness of breath.  Weakness after normal activity.  The white part of the eye turns yellow (jaundice).  You have a decrease in the amount of urine or are urinating less often.  Your urine turns a dark color or changes to pink, red, or brown. Document Released: 10/04/2000 Document Revised: 12/30/2011 Document Reviewed: 05/23/2008 [facility] Patient Information 2014 [place], [place].  _______________________________________________________________________

## 2017-05-30 ENCOUNTER — Encounter (HOSPITAL_COMMUNITY)
Admission: RE | Admit: 2017-05-30 | Discharge: 2017-05-30 | Disposition: A | Discharge status: Home / Self Care | Payer: BLUE CROSS/BLUE SHIELD | Source: Ambulatory Visit | Attending: Surgery | Admitting: Surgery

## 2017-05-30 ENCOUNTER — Encounter (HOSPITAL_COMMUNITY): Payer: Self-pay

## 2017-05-30 DIAGNOSIS — Z01812 Encounter for preprocedural laboratory examination: Secondary | ICD-10-CM | POA: Insufficient documentation

## 2017-05-30 DIAGNOSIS — Z01818 Encounter for other preprocedural examination: Secondary | ICD-10-CM | POA: Diagnosis present

## 2017-05-30 DIAGNOSIS — I10 Essential (primary) hypertension: Secondary | ICD-10-CM | POA: Insufficient documentation

## 2017-05-30 DIAGNOSIS — R9431 Abnormal electrocardiogram [ECG] [EKG]: Secondary | ICD-10-CM | POA: Diagnosis not present

## 2017-05-30 DIAGNOSIS — I447 Left bundle-branch block, unspecified: Secondary | ICD-10-CM | POA: Diagnosis not present

## 2017-05-30 DIAGNOSIS — K5792 Diverticulitis of intestine, part unspecified, without perforation or abscess without bleeding: Secondary | ICD-10-CM | POA: Insufficient documentation

## 2017-05-30 LAB — BASIC METABOLIC PANEL
Anion gap: 8 (ref 5–15)
BUN: 11 mg/dL (ref 6–20)
CO2: 28 mmol/L (ref 22–32)
Calcium: 9.5 mg/dL (ref 8.9–10.3)
Chloride: 103 mmol/L (ref 101–111)
Creatinine, Ser: 0.75 mg/dL (ref 0.44–1.00)
GFR calc Af Amer: >60 mL/min (ref >60)
GFR calc non Af Amer: >60 mL/min (ref >60)
Glucose, Bld: 95 mg/dL (ref 65–99)
Potassium: 3.2 mmol/L — ABNORMAL LOW (ref 3.5–5.1)
Sodium: 139 mmol/L (ref 135–145)

## 2017-05-30 LAB — HEMOGLOBIN A1C
Hgb A1c MFr Bld: 5.3 % (ref 4.8–5.6)
Mean Plasma Glucose: 105.41 mg/dL

## 2017-05-30 LAB — CBC
HCT: 40.7 % (ref 36.0–46.0)
Hemoglobin: 13.9 g/dL (ref 12.0–15.0)
MCH: 32 pg (ref 26.0–34.0)
MCHC: 34.2 g/dL (ref 30.0–36.0)
MCV: 93.6 fL (ref 78.0–100.0)
Platelets: 306 10*3/uL (ref 150–400)
RBC: 4.35 MIL/uL (ref 3.87–5.11)
RDW: 13.1 % (ref 11.5–15.5)
WBC: 7.6 10*3/uL (ref 4.0–10.5)

## 2017-05-30 NOTE — Progress Notes (Signed)
Consent is incomplete. Please complete

## 2017-06-04 ENCOUNTER — Ambulatory Visit: Payer: Self-pay | Admitting: Surgery

## 2017-06-05 ENCOUNTER — Encounter (HOSPITAL_COMMUNITY): Payer: Self-pay | Admitting: Anesthesiology

## 2017-06-05 MED ORDER — SODIUM CHLORIDE 0.9 % IV SOLN
INTRAVENOUS | Status: DC
  Filled 2017-06-05: qty 6

## 2017-06-05 NOTE — Anesthesia Preprocedure Evaluation (Addendum)
Anesthesia Evaluation  Patient identified by MRN, date of birth, ID band Patient awake    Reviewed: Allergy & Precautions, NPO status , Patient's Chart, lab work & pertinent test results  Airway Mallampati: III  TM Distance: >3 FB Neck ROM: Full    Dental no notable dental hx. (+) Teeth Intact, Caps   Pulmonary neg pulmonary ROS,    Pulmonary exam normal breath sounds clear to auscultation       Cardiovascular hypertension, Pt. on medications Normal cardiovascular exam Rhythm:Regular Rate:Normal  Hx/o LBBB   Neuro/Psych negative neurological ROS  negative psych ROS   GI/Hepatic Neg liver ROS, hiatal hernia, GERD  Medicated and Controlled,IBS Diverticulitis Barrett's Esophagus S/P Nissen fundoplication   Endo/Other  Hypothyroidism   Renal/GU negative Renal ROS  negative genitourinary   Musculoskeletal  (+) Arthritis , Osteoarthritis,  DDD   Abdominal   Peds  Hematology  (+) anemia ,   Anesthesia Other Findings   Reproductive/Obstetrics                            Anesthesia Physical Anesthesia Plan  ASA: II  Anesthesia Plan: General   Post-op Pain Management:    Induction: Intravenous and Cricoid pressure planned  PONV Risk Score and Plan: 4 or greater and Ondansetron, Dexamethasone, Scopolamine patch - Pre-op, Propofol infusion, Midazolam, Metaclopromide and Treatment may vary due to age or medical condition  Airway Management Planned: Oral ETT  Additional Equipment:   Intra-op Plan:   Post-operative Plan: Extubation in OR  Informed Consent: I have reviewed the patients History and Physical, chart, labs and discussed the procedure including the risks, benefits and alternatives for the proposed anesthesia with the patient or authorized representative who has indicated his/her understanding and acceptance.   Dental advisory given  Plan Discussed with: CRNA, Anesthesiologist  and Surgeon  Anesthesia Plan Comments:        Anesthesia Quick Evaluation

## 2017-06-06 ENCOUNTER — Inpatient Hospital Stay (HOSPITAL_COMMUNITY): Payer: BLUE CROSS/BLUE SHIELD | Admitting: Anesthesiology

## 2017-06-06 ENCOUNTER — Encounter (HOSPITAL_COMMUNITY)
Admission: RE | Disposition: A | Discharge status: Home / Self Care | Payer: Self-pay | Source: Ambulatory Visit | Attending: Surgery

## 2017-06-06 ENCOUNTER — Encounter (HOSPITAL_COMMUNITY): Payer: Self-pay | Admitting: *Deleted

## 2017-06-06 ENCOUNTER — Inpatient Hospital Stay (HOSPITAL_COMMUNITY)
Admission: RE | Admit: 2017-06-06 | Discharge: 2017-06-10 | DRG: 331 | Disposition: A | Discharge status: Home / Self Care | Payer: BLUE CROSS/BLUE SHIELD | Source: Ambulatory Visit | Attending: Surgery | Admitting: Surgery

## 2017-06-06 DIAGNOSIS — G8929 Other chronic pain: Secondary | ICD-10-CM | POA: Diagnosis present

## 2017-06-06 DIAGNOSIS — M542 Cervicalgia: Secondary | ICD-10-CM | POA: Diagnosis present

## 2017-06-06 DIAGNOSIS — K5732 Diverticulitis of large intestine without perforation or abscess without bleeding: Secondary | ICD-10-CM | POA: Diagnosis present

## 2017-06-06 DIAGNOSIS — Z79891 Long term (current) use of opiate analgesic: Secondary | ICD-10-CM

## 2017-06-06 DIAGNOSIS — Z885 Allergy status to narcotic agent status: Secondary | ICD-10-CM | POA: Diagnosis not present

## 2017-06-06 DIAGNOSIS — K2270 Barrett's esophagus without dysplasia: Secondary | ICD-10-CM | POA: Diagnosis present

## 2017-06-06 DIAGNOSIS — I10 Essential (primary) hypertension: Secondary | ICD-10-CM | POA: Diagnosis present

## 2017-06-06 DIAGNOSIS — R0682 Tachypnea, not elsewhere classified: Secondary | ICD-10-CM

## 2017-06-06 DIAGNOSIS — G894 Chronic pain syndrome: Secondary | ICD-10-CM | POA: Diagnosis present

## 2017-06-06 DIAGNOSIS — Z85828 Personal history of other malignant neoplasm of skin: Secondary | ICD-10-CM | POA: Diagnosis not present

## 2017-06-06 DIAGNOSIS — K5792 Diverticulitis of intestine, part unspecified, without perforation or abscess without bleeding: Secondary | ICD-10-CM | POA: Diagnosis present

## 2017-06-06 DIAGNOSIS — K5730 Diverticulosis of large intestine without perforation or abscess without bleeding: Secondary | ICD-10-CM | POA: Diagnosis present

## 2017-06-06 DIAGNOSIS — E039 Hypothyroidism, unspecified: Secondary | ICD-10-CM | POA: Diagnosis present

## 2017-06-06 DIAGNOSIS — M1990 Unspecified osteoarthritis, unspecified site: Secondary | ICD-10-CM | POA: Diagnosis present

## 2017-06-06 DIAGNOSIS — Z881 Allergy status to other antibiotic agents status: Secondary | ICD-10-CM | POA: Diagnosis not present

## 2017-06-06 DIAGNOSIS — M545 Low back pain: Secondary | ICD-10-CM | POA: Diagnosis present

## 2017-06-06 DIAGNOSIS — K589 Irritable bowel syndrome without diarrhea: Secondary | ICD-10-CM | POA: Diagnosis present

## 2017-06-06 DIAGNOSIS — D649 Anemia, unspecified: Secondary | ICD-10-CM | POA: Diagnosis present

## 2017-06-06 DIAGNOSIS — Z90710 Acquired absence of both cervix and uterus: Secondary | ICD-10-CM

## 2017-06-06 DIAGNOSIS — Z808 Family history of malignant neoplasm of other organs or systems: Secondary | ICD-10-CM

## 2017-06-06 DIAGNOSIS — K580 Irritable bowel syndrome with diarrhea: Secondary | ICD-10-CM | POA: Diagnosis present

## 2017-06-06 DIAGNOSIS — K219 Gastro-esophageal reflux disease without esophagitis: Secondary | ICD-10-CM | POA: Diagnosis present

## 2017-06-06 DIAGNOSIS — Z888 Allergy status to other drugs, medicaments and biological substances status: Secondary | ICD-10-CM

## 2017-06-06 DIAGNOSIS — Z7982 Long term (current) use of aspirin: Secondary | ICD-10-CM | POA: Diagnosis not present

## 2017-06-06 DIAGNOSIS — Z8249 Family history of ischemic heart disease and other diseases of the circulatory system: Secondary | ICD-10-CM | POA: Diagnosis not present

## 2017-06-06 DIAGNOSIS — Z792 Long term (current) use of antibiotics: Secondary | ICD-10-CM

## 2017-06-06 DIAGNOSIS — E739 Lactose intolerance, unspecified: Secondary | ICD-10-CM | POA: Diagnosis present

## 2017-06-06 DIAGNOSIS — M810 Age-related osteoporosis without current pathological fracture: Secondary | ICD-10-CM | POA: Diagnosis present

## 2017-06-06 DIAGNOSIS — I447 Left bundle-branch block, unspecified: Secondary | ICD-10-CM | POA: Diagnosis present

## 2017-06-06 HISTORY — PX: PROCTOSCOPY: SHX2266

## 2017-06-06 HISTORY — DX: Other specified postprocedural states: Z98.890

## 2017-06-06 HISTORY — DX: Gastritis, unspecified, without bleeding: K29.70

## 2017-06-06 HISTORY — DX: Gastroduodenitis, unspecified, without bleeding: K29.90

## 2017-06-06 LAB — ABO/RH: ABO/RH(D): B POS

## 2017-06-06 LAB — TYPE AND SCREEN
ABO/RH(D): B POS
Antibody Screen: NEGATIVE

## 2017-06-06 SURGERY — COLECTOMY, PARTIAL, ROBOT-ASSISTED, LAPAROSCOPIC
Anesthesia: General | Site: Rectum

## 2017-06-06 MED ORDER — HYDROCORTISONE 2.5 % RE CREA: 1.0000 "application " | TOPICAL_CREAM | Freq: Four times a day (QID) | RECTAL | Status: DC | PRN

## 2017-06-06 MED ORDER — GABAPENTIN 300 MG PO CAPS
300.0000 mg | ORAL_CAPSULE | ORAL | Status: AC
  Administered 2017-06-06: 300 mg via ORAL
  Filled 2017-06-06: qty 1

## 2017-06-06 MED ORDER — DICYCLOMINE HCL 20 MG PO TABS
20.0000 mg | ORAL_TABLET | Freq: Three times a day (TID) | ORAL | Status: DC | PRN
  Filled 2017-06-06: qty 1

## 2017-06-06 MED ORDER — ESMOLOL HCL 100 MG/10ML IV SOLN
INTRAVENOUS | Status: DC | PRN
  Administered 2017-06-06: 20 mg via INTRAVENOUS

## 2017-06-06 MED ORDER — PHENYLEPHRINE HCL 10 MG/ML IJ SOLN
INTRAMUSCULAR | Status: DC | PRN
  Administered 2017-06-06: 80 ug via INTRAVENOUS

## 2017-06-06 MED ORDER — METRONIDAZOLE 500 MG PO TABS
1000.0000 mg | ORAL_TABLET | ORAL | Status: DC
  Filled 2017-06-06: qty 2

## 2017-06-06 MED ORDER — EPHEDRINE SULFATE 50 MG/ML IJ SOLN
INTRAMUSCULAR | Status: DC | PRN
  Administered 2017-06-06: 5 mg via INTRAVENOUS

## 2017-06-06 MED ORDER — LACTATED RINGERS IV BOLUS (SEPSIS)
500.0000 mL | Freq: Once | INTRAVENOUS | Status: AC
  Administered 2017-06-06: 500 mL via INTRAVENOUS

## 2017-06-06 MED ORDER — CALCIUM CARBONATE-VITAMIN D 500-200 MG-UNIT PO TABS
1.0000 | ORAL_TABLET | Freq: Every day | ORAL | Status: DC
  Administered 2017-06-06 – 2017-06-09 (×2): 1 via ORAL
  Filled 2017-06-06 (×3): qty 1

## 2017-06-06 MED ORDER — KETAMINE HCL 10 MG/ML IJ SOLN
INTRAMUSCULAR | Status: DC | PRN
  Administered 2017-06-06: 28 mg via INTRAVENOUS

## 2017-06-06 MED ORDER — NEOMYCIN SULFATE 500 MG PO TABS
1000.0000 mg | ORAL_TABLET | ORAL | Status: DC
  Filled 2017-06-06: qty 2

## 2017-06-06 MED ORDER — HYDROCORTISONE 1 % EX CREA: 1.0000 "application " | TOPICAL_CREAM | Freq: Three times a day (TID) | CUTANEOUS | Status: DC | PRN

## 2017-06-06 MED ORDER — DEXAMETHASONE SODIUM PHOSPHATE 10 MG/ML IJ SOLN
INTRAMUSCULAR | Status: DC | PRN
  Administered 2017-06-06: 6 mg via INTRAVENOUS

## 2017-06-06 MED ORDER — BUPIVACAINE LIPOSOME 1.3 % IJ SUSP
INTRAMUSCULAR | Status: DC | PRN
  Administered 2017-06-06: 20 mL

## 2017-06-06 MED ORDER — SUCCINYLCHOLINE CHLORIDE 200 MG/10ML IV SOSY
PREFILLED_SYRINGE | INTRAVENOUS | Status: AC
  Filled 2017-06-06: qty 10

## 2017-06-06 MED ORDER — BUPIVACAINE-EPINEPHRINE (PF) 0.25% -1:200000 IJ SOLN
INTRAMUSCULAR | Status: AC
  Filled 2017-06-06: qty 30

## 2017-06-06 MED ORDER — FENTANYL CITRATE (PF) 250 MCG/5ML IJ SOLN
INTRAMUSCULAR | Status: AC
  Filled 2017-06-06: qty 5

## 2017-06-06 MED ORDER — ROCURONIUM BROMIDE 50 MG/5ML IV SOSY
PREFILLED_SYRINGE | INTRAVENOUS | Status: AC
  Filled 2017-06-06: qty 5

## 2017-06-06 MED ORDER — POLYETHYLENE GLYCOL 3350 17 GM/SCOOP PO POWD
1.0000 | Freq: Once | ORAL | Status: DC
  Filled 2017-06-06: qty 255

## 2017-06-06 MED ORDER — MIDAZOLAM HCL 5 MG/5ML IJ SOLN
INTRAMUSCULAR | Status: DC | PRN
  Administered 2017-06-06: 2 mg via INTRAVENOUS

## 2017-06-06 MED ORDER — GUAIFENESIN-DM 100-10 MG/5ML PO SYRP: 10.0000 mL | ORAL_SOLUTION | ORAL | Status: DC | PRN

## 2017-06-06 MED ORDER — BUPIVACAINE LIPOSOME 1.3 % IJ SUSP
20.0000 mL | INTRAMUSCULAR | Status: DC
  Filled 2017-06-06: qty 20

## 2017-06-06 MED ORDER — HYDROMORPHONE HCL-NACL 0.5-0.9 MG/ML-% IV SOSY
0.2500 mg | PREFILLED_SYRINGE | INTRAVENOUS | Status: DC | PRN
  Administered 2017-06-06 (×4): 0.5 mg via INTRAVENOUS

## 2017-06-06 MED ORDER — ENOXAPARIN SODIUM 40 MG/0.4ML ~~LOC~~ SOLN
40.0000 mg | SUBCUTANEOUS | Status: DC
  Administered 2017-06-07 – 2017-06-10 (×4): 40 mg via SUBCUTANEOUS
  Filled 2017-06-06 (×5): qty 0.4

## 2017-06-06 MED ORDER — SUCCINYLCHOLINE CHLORIDE 200 MG/10ML IV SOSY
PREFILLED_SYRINGE | INTRAVENOUS | Status: DC | PRN
  Administered 2017-06-06: 100 mg via INTRAVENOUS

## 2017-06-06 MED ORDER — KCL IN DEXTROSE-NACL 20-5-0.45 MEQ/L-%-% IV SOLN
INTRAVENOUS | Status: DC
  Administered 2017-06-06: 1000 mL via INTRAVENOUS
  Administered 2017-06-08: 08:00:00 via INTRAVENOUS
  Filled 2017-06-06 (×6): qty 1000

## 2017-06-06 MED ORDER — LIDOCAINE 2% (20 MG/ML) 5 ML SYRINGE
INTRAMUSCULAR | Status: DC | PRN
  Administered 2017-06-06: 1.5 mg/kg/h via INTRAVENOUS

## 2017-06-06 MED ORDER — TRAMADOL HCL 50 MG PO TABS: 50.0000 mg | ORAL_TABLET | Freq: Four times a day (QID) | ORAL | 0 refills | Status: DC | PRN

## 2017-06-06 MED ORDER — VALACYCLOVIR HCL 500 MG PO TABS
1000.0000 mg | ORAL_TABLET | Freq: Every day | ORAL | Status: DC
  Administered 2017-06-06 – 2017-06-09 (×4): 1000 mg via ORAL
  Filled 2017-06-06 (×5): qty 2

## 2017-06-06 MED ORDER — SUGAMMADEX SODIUM 200 MG/2ML IV SOLN
INTRAVENOUS | Status: DC | PRN
  Administered 2017-06-06: 150 mg via INTRAVENOUS

## 2017-06-06 MED ORDER — OMEGA-3-ACID ETHYL ESTERS 1 G PO CAPS
1.0000 g | ORAL_CAPSULE | Freq: Every day | ORAL | Status: DC
  Administered 2017-06-06 – 2017-06-09 (×2): 1 g via ORAL
  Filled 2017-06-06 (×3): qty 1

## 2017-06-06 MED ORDER — ONDANSETRON HCL 4 MG/2ML IJ SOLN
INTRAMUSCULAR | Status: DC | PRN
  Administered 2017-06-06 (×2): 4 mg via INTRAVENOUS

## 2017-06-06 MED ORDER — CEFOTETAN DISODIUM-DEXTROSE 2-2.08 GM-% IV SOLR
2.0000 g | INTRAVENOUS | Status: AC
  Administered 2017-06-06: 2 g via INTRAVENOUS

## 2017-06-06 MED ORDER — LABETALOL HCL 5 MG/ML IV SOLN
INTRAVENOUS | Status: AC
  Filled 2017-06-06: qty 4

## 2017-06-06 MED ORDER — ACETAMINOPHEN 500 MG PO TABS
1000.0000 mg | ORAL_TABLET | ORAL | Status: AC
  Administered 2017-06-06: 1000 mg via ORAL
  Filled 2017-06-06: qty 2

## 2017-06-06 MED ORDER — ONDANSETRON HCL 4 MG/2ML IJ SOLN
4.0000 mg | Freq: Four times a day (QID) | INTRAMUSCULAR | Status: DC | PRN
  Administered 2017-06-06: 4 mg via INTRAVENOUS

## 2017-06-06 MED ORDER — RISAQUAD PO CAPS
1.0000 | ORAL_CAPSULE | Freq: Every day | ORAL | Status: DC
  Administered 2017-06-06 – 2017-06-09 (×3): 1 via ORAL
  Filled 2017-06-06 (×6): qty 1

## 2017-06-06 MED ORDER — PROPOFOL 10 MG/ML IV BOLUS
INTRAVENOUS | Status: AC
  Filled 2017-06-06: qty 20

## 2017-06-06 MED ORDER — PROPOFOL 10 MG/ML IV BOLUS
INTRAVENOUS | Status: DC | PRN
  Administered 2017-06-06: 150 mg via INTRAVENOUS

## 2017-06-06 MED ORDER — PSYLLIUM 95 % PO PACK
1.0000 | PACK | Freq: Every day | ORAL | Status: DC
  Administered 2017-06-06 – 2017-06-09 (×2): 1 via ORAL
  Filled 2017-06-06 (×3): qty 1

## 2017-06-06 MED ORDER — ALVIMOPAN 12 MG PO CAPS
12.0000 mg | ORAL_CAPSULE | Freq: Once | ORAL | Status: AC
  Administered 2017-06-06: 12 mg via ORAL
  Filled 2017-06-06: qty 1

## 2017-06-06 MED ORDER — ADULT MULTIVITAMIN W/MINERALS CH
1.0000 | ORAL_TABLET | Freq: Every day | ORAL | Status: DC
  Administered 2017-06-06 – 2017-06-09 (×2): 1 via ORAL
  Filled 2017-06-06 (×3): qty 1

## 2017-06-06 MED ORDER — ASPIRIN 81 MG PO CHEW
81.0000 mg | CHEWABLE_TABLET | Freq: Every day | ORAL | Status: DC
  Administered 2017-06-06 – 2017-06-09 (×4): 81 mg via ORAL
  Filled 2017-06-06 (×4): qty 1

## 2017-06-06 MED ORDER — PHENOL 1.4 % MT LIQD: 1.0000 | OROMUCOSAL | Status: DC | PRN

## 2017-06-06 MED ORDER — ONDANSETRON HCL 4 MG/2ML IJ SOLN
INTRAMUSCULAR | Status: AC
  Filled 2017-06-06: qty 2

## 2017-06-06 MED ORDER — HYDROMORPHONE HCL-NACL 0.5-0.9 MG/ML-% IV SOSY
PREFILLED_SYRINGE | INTRAVENOUS | Status: AC
  Filled 2017-06-06: qty 2

## 2017-06-06 MED ORDER — DEXAMETHASONE SODIUM PHOSPHATE 10 MG/ML IJ SOLN
INTRAMUSCULAR | Status: AC
  Filled 2017-06-06: qty 1

## 2017-06-06 MED ORDER — ALUM & MAG HYDROXIDE-SIMETH 200-200-20 MG/5ML PO SUSP: 30.0000 mL | Freq: Four times a day (QID) | ORAL | Status: DC | PRN

## 2017-06-06 MED ORDER — ENOXAPARIN SODIUM 40 MG/0.4ML ~~LOC~~ SOLN
40.0000 mg | Freq: Once | SUBCUTANEOUS | Status: AC
  Administered 2017-06-06: 40 mg via SUBCUTANEOUS
  Filled 2017-06-06: qty 0.4

## 2017-06-06 MED ORDER — MAGIC MOUTHWASH
15.0000 mL | Freq: Four times a day (QID) | ORAL | Status: DC | PRN
  Filled 2017-06-06: qty 15

## 2017-06-06 MED ORDER — KETAMINE HCL 10 MG/ML IJ SOLN
INTRAMUSCULAR | Status: AC
  Filled 2017-06-06: qty 1

## 2017-06-06 MED ORDER — 0.9 % SODIUM CHLORIDE (POUR BTL) OPTIME
TOPICAL | Status: DC | PRN
  Administered 2017-06-06: 2000 mL

## 2017-06-06 MED ORDER — LIP MEDEX EX OINT
1.0000 "application " | TOPICAL_OINTMENT | Freq: Two times a day (BID) | CUTANEOUS | Status: DC
  Administered 2017-06-06 – 2017-06-09 (×6): 1 via TOPICAL
  Filled 2017-06-06: qty 7

## 2017-06-06 MED ORDER — SODIUM CHLORIDE 0.9 % IV SOLN
INTRAVENOUS | Status: DC | PRN
  Administered 2017-06-06: 1000 mL via INTRAPERITONEAL

## 2017-06-06 MED ORDER — FAMOTIDINE 20 MG PO TABS
20.0000 mg | ORAL_TABLET | Freq: Two times a day (BID) | ORAL | Status: DC
  Administered 2017-06-06 – 2017-06-09 (×7): 20 mg via ORAL
  Filled 2017-06-06 (×7): qty 1

## 2017-06-06 MED ORDER — LACTATED RINGERS IV BOLUS (SEPSIS)
1000.0000 mL | Freq: Three times a day (TID) | INTRAVENOUS | Status: AC | PRN
  Administered 2017-06-06 – 2017-06-07 (×2): 1000 mL via INTRAVENOUS

## 2017-06-06 MED ORDER — BISACODYL 5 MG PO TBEC
20.0000 mg | DELAYED_RELEASE_TABLET | Freq: Once | ORAL | Status: DC
  Filled 2017-06-06: qty 4

## 2017-06-06 MED ORDER — ROCURONIUM BROMIDE 50 MG/5ML IV SOSY
PREFILLED_SYRINGE | INTRAVENOUS | Status: DC | PRN
  Administered 2017-06-06: 50 mg via INTRAVENOUS
  Administered 2017-06-06: 20 mg via INTRAVENOUS

## 2017-06-06 MED ORDER — LIDOCAINE 2% (20 MG/ML) 5 ML SYRINGE
INTRAMUSCULAR | Status: DC | PRN
  Administered 2017-06-06: 100 mg via INTRAVENOUS

## 2017-06-06 MED ORDER — DICLOFENAC SODIUM 1 % TD GEL
2.0000 g | Freq: Three times a day (TID) | TRANSDERMAL | Status: DC | PRN
  Administered 2017-06-07 (×2): 2 g via TOPICAL
  Filled 2017-06-06: qty 100

## 2017-06-06 MED ORDER — LIDOCAINE 2% (20 MG/ML) 5 ML SYRINGE
INTRAMUSCULAR | Status: AC
  Filled 2017-06-06: qty 10

## 2017-06-06 MED ORDER — PHENYLEPHRINE 40 MCG/ML (10ML) SYRINGE FOR IV PUSH (FOR BLOOD PRESSURE SUPPORT)
PREFILLED_SYRINGE | INTRAVENOUS | Status: AC
  Filled 2017-06-06: qty 10

## 2017-06-06 MED ORDER — ESMOLOL HCL 100 MG/10ML IV SOLN
INTRAVENOUS | Status: AC
  Filled 2017-06-06: qty 10

## 2017-06-06 MED ORDER — RALOXIFENE HCL 60 MG PO TABS
60.0000 mg | ORAL_TABLET | Freq: Every day | ORAL | Status: DC
  Administered 2017-06-06 – 2017-06-09 (×4): 60 mg via ORAL
  Filled 2017-06-06 (×5): qty 1

## 2017-06-06 MED ORDER — LIDOCAINE 2% (20 MG/ML) 5 ML SYRINGE
INTRAMUSCULAR | Status: AC
  Filled 2017-06-06: qty 5

## 2017-06-06 MED ORDER — EQL OMEGA 3 FISH OIL 1400 MG PO CAPS: 1400.0000 mg | ORAL_CAPSULE | Freq: Every day | ORAL | Status: DC

## 2017-06-06 MED ORDER — MEPERIDINE HCL 50 MG/ML IJ SOLN: 6.2500 mg | INTRAMUSCULAR | Status: DC | PRN

## 2017-06-06 MED ORDER — METHOCARBAMOL 500 MG PO TABS
1000.0000 mg | ORAL_TABLET | Freq: Four times a day (QID) | ORAL | Status: DC
  Administered 2017-06-06 (×3): 1000 mg via ORAL
  Filled 2017-06-06 (×3): qty 2

## 2017-06-06 MED ORDER — MENTHOL 3 MG MT LOZG: 1.0000 | LOZENGE | OROMUCOSAL | Status: DC | PRN

## 2017-06-06 MED ORDER — ALIGN 4 MG PO CAPS: 1.0000 | ORAL_CAPSULE | Freq: Every day | ORAL | Status: DC

## 2017-06-06 MED ORDER — LACTATED RINGERS IR SOLN
Status: DC | PRN
  Administered 2017-06-06: 1000 mL

## 2017-06-06 MED ORDER — GLYCOPYRROLATE 0.2 MG/ML IJ SOLN
INTRAMUSCULAR | Status: DC | PRN
  Administered 2017-06-06: 0.1 mg via INTRAVENOUS

## 2017-06-06 MED ORDER — SUGAMMADEX SODIUM 200 MG/2ML IV SOLN
INTRAVENOUS | Status: AC
  Filled 2017-06-06: qty 2

## 2017-06-06 MED ORDER — LACTATED RINGERS IV SOLN
INTRAVENOUS | Status: DC | PRN
  Administered 2017-06-06 (×2): via INTRAVENOUS

## 2017-06-06 MED ORDER — TRAMADOL HCL 50 MG PO TABS
50.0000 mg | ORAL_TABLET | Freq: Four times a day (QID) | ORAL | Status: DC | PRN
  Administered 2017-06-06 – 2017-06-09 (×3): 50 mg via ORAL
  Filled 2017-06-06 (×3): qty 1

## 2017-06-06 MED ORDER — METOCLOPRAMIDE HCL 5 MG/ML IJ SOLN: 10.0000 mg | Freq: Once | INTRAMUSCULAR | Status: DC | PRN

## 2017-06-06 MED ORDER — ONDANSETRON HCL 4 MG/2ML IJ SOLN
INTRAMUSCULAR | Status: AC
  Administered 2017-06-06: 4 mg via INTRAVENOUS
  Filled 2017-06-06: qty 2

## 2017-06-06 MED ORDER — SCOPOLAMINE 1 MG/3DAYS TD PT72
MEDICATED_PATCH | TRANSDERMAL | Status: DC | PRN
  Administered 2017-06-06: 1 via TRANSDERMAL

## 2017-06-06 MED ORDER — MIDAZOLAM HCL 2 MG/2ML IJ SOLN
INTRAMUSCULAR | Status: AC
  Filled 2017-06-06: qty 2

## 2017-06-06 MED ORDER — CELECOXIB 200 MG PO CAPS
400.0000 mg | ORAL_CAPSULE | ORAL | Status: AC
  Administered 2017-06-06: 400 mg via ORAL
  Filled 2017-06-06: qty 2

## 2017-06-06 MED ORDER — BUPIVACAINE-EPINEPHRINE (PF) 0.25% -1:200000 IJ SOLN
INTRAMUSCULAR | Status: DC | PRN
  Administered 2017-06-06: 60 mL

## 2017-06-06 MED ORDER — PROMETHAZINE HCL 25 MG/ML IJ SOLN
12.5000 mg | Freq: Four times a day (QID) | INTRAMUSCULAR | Status: DC | PRN
  Administered 2017-06-07: 12.5 mg via INTRAVENOUS
  Filled 2017-06-06: qty 1

## 2017-06-06 MED ORDER — FENTANYL CITRATE (PF) 250 MCG/5ML IJ SOLN
INTRAMUSCULAR | Status: DC | PRN
  Administered 2017-06-06 (×5): 50 ug via INTRAVENOUS

## 2017-06-06 MED ORDER — LEVOTHYROXINE SODIUM 88 MCG PO TABS
88.0000 ug | ORAL_TABLET | Freq: Every day | ORAL | Status: DC
  Administered 2017-06-07 – 2017-06-10 (×4): 88 ug via ORAL
  Filled 2017-06-06 (×4): qty 1

## 2017-06-06 SURGICAL SUPPLY — 100 items
APPLIER CLIP 5 13 M/L LIGAMAX5 (MISCELLANEOUS)
APPLIER CLIP ROT 10 11.4 M/L (STAPLE)
CANNULA REDUC XI 12-8 STAPL (CANNULA) ×1
CANNULA REDUCER 12-8 DVNC XI (CANNULA) ×2 IMPLANT
CELLS DAT CNTRL 66122 CELL SVR (MISCELLANEOUS) IMPLANT
CHLORAPREP W/TINT 26ML (MISCELLANEOUS) ×3 IMPLANT
CLIP APPLIE 5 13 M/L LIGAMAX5 (MISCELLANEOUS) IMPLANT
CLIP APPLIE ROT 10 11.4 M/L (STAPLE) IMPLANT
CLIP VESOLOCK LG 6/CT PURPLE (CLIP) IMPLANT
CLIP VESOLOCK MED LG 6/CT (CLIP) IMPLANT
COUNTER NEEDLE 20 DBL MAG RED (NEEDLE) IMPLANT
COVER SURGICAL LIGHT HANDLE (MISCELLANEOUS) ×3 IMPLANT
COVER TIP SHEARS 8 DVNC (MISCELLANEOUS) ×2 IMPLANT
COVER TIP SHEARS 8MM DA VINCI (MISCELLANEOUS) ×1
DECANTER SPIKE VIAL GLASS SM (MISCELLANEOUS) ×3 IMPLANT
DEVICE PMI PUNCTURE CLOSURE (MISCELLANEOUS) ×3 IMPLANT
DRAIN CHANNEL 19F RND (DRAIN) ×6 IMPLANT
DRAPE ARM DVNC X/XI (DISPOSABLE) ×6 IMPLANT
DRAPE COLUMN DVNC XI (DISPOSABLE) ×2 IMPLANT
DRAPE DA VINCI XI ARM (DISPOSABLE) ×3
DRAPE DA VINCI XI COLUMN (DISPOSABLE) ×1
DRAPE SURG IRRIG POUCH 19X23 (DRAPES) ×3 IMPLANT
DRSG OPSITE POSTOP 4X10 (GAUZE/BANDAGES/DRESSINGS) IMPLANT
DRSG OPSITE POSTOP 4X6 (GAUZE/BANDAGES/DRESSINGS) ×3 IMPLANT
DRSG OPSITE POSTOP 4X8 (GAUZE/BANDAGES/DRESSINGS) IMPLANT
DRSG TEGADERM 2-3/8X2-3/4 SM (GAUZE/BANDAGES/DRESSINGS) ×3 IMPLANT
DRSG TEGADERM 4X4.75 (GAUZE/BANDAGES/DRESSINGS) ×3 IMPLANT
ELECT PENCIL ROCKER SW 15FT (MISCELLANEOUS) IMPLANT
ELECT REM PT RETURN 15FT ADLT (MISCELLANEOUS) ×3 IMPLANT
ENDOLOOP SUT PDS II  0 18 (SUTURE)
ENDOLOOP SUT PDS II 0 18 (SUTURE) IMPLANT
EVACUATOR SILICONE 100CC (DRAIN) ×3 IMPLANT
GAUZE SPONGE 2X2 8PLY STRL LF (GAUZE/BANDAGES/DRESSINGS) ×2 IMPLANT
GAUZE SPONGE 4X4 12PLY STRL (GAUZE/BANDAGES/DRESSINGS) IMPLANT
GLOVE ECLIPSE 8.0 STRL XLNG CF (GLOVE) ×15 IMPLANT
GLOVE INDICATOR 8.0 STRL GRN (GLOVE) ×15 IMPLANT
GOWN STRL REUS W/TWL XL LVL3 (GOWN DISPOSABLE) ×30 IMPLANT
GRASPER ENDOPATH ANVIL 10MM (MISCELLANEOUS) IMPLANT
HOLDER FOLEY CATH W/STRAP (MISCELLANEOUS) ×3 IMPLANT
IRRIG SUCT STRYKERFLOW 2 WTIP (MISCELLANEOUS) ×3
IRRIGATION SUCT STRKRFLW 2 WTP (MISCELLANEOUS) ×2 IMPLANT
IRRIGATOR SUCT 8 DISP DVNC XI (IRRIGATION / IRRIGATOR) IMPLANT
IRRIGATOR SUCTION 8MM XI DISP (IRRIGATION / IRRIGATOR)
KIT PROCEDURE DA VINCI SI (MISCELLANEOUS)
KIT PROCEDURE DVNC SI (MISCELLANEOUS) IMPLANT
LEGGING LITHOTOMY PAIR STRL (DRAPES) ×3 IMPLANT
LUBRICANT JELLY K Y 4OZ (MISCELLANEOUS) ×3 IMPLANT
NEEDLE INSUFFLATION 14GA 120MM (NEEDLE) ×3 IMPLANT
PACK CARDIOVASCULAR III (CUSTOM PROCEDURE TRAY) ×3 IMPLANT
PACK COLON (CUSTOM PROCEDURE TRAY) ×3 IMPLANT
PAD POSITIONING PINK XL (MISCELLANEOUS) ×3 IMPLANT
PORT LAP GEL ALEXIS MED 5-9CM (MISCELLANEOUS) ×3 IMPLANT
RTRCTR WOUND ALEXIS 18CM MED (MISCELLANEOUS)
SCISSORS LAP 5X35 DISP (ENDOMECHANICALS) ×3 IMPLANT
SEAL CANN UNIV 5-8 DVNC XI (MISCELLANEOUS) ×8 IMPLANT
SEAL XI 5MM-8MM UNIVERSAL (MISCELLANEOUS) ×4
SEALER VESSEL DA VINCI XI (MISCELLANEOUS) ×1
SEALER VESSEL EXT DVNC XI (MISCELLANEOUS) ×2 IMPLANT
SLEEVE ADV FIXATION 5X100MM (TROCAR) ×3 IMPLANT
SOLUTION ELECTROLUBE (MISCELLANEOUS) ×3 IMPLANT
SPONGE GAUZE 2X2 STER 10/PKG (GAUZE/BANDAGES/DRESSINGS) ×1
STAPLER 45 BLU RELOAD XI (STAPLE) IMPLANT
STAPLER 45 BLUE RELOAD XI (STAPLE)
STAPLER 45 GREEN RELOAD XI (STAPLE) ×1
STAPLER 45 GRN RELOAD XI (STAPLE) ×2 IMPLANT
STAPLER CANNULA SEAL DVNC XI (STAPLE) ×2 IMPLANT
STAPLER CANNULA SEAL XI (STAPLE) ×1
STAPLER CIRC ILS CVD 33MM 37CM (STAPLE) ×3 IMPLANT
STAPLER SHEATH (SHEATH) ×1
STAPLER SHEATH ENDOWRIST DVNC (SHEATH) ×2 IMPLANT
SUT MNCRL AB 4-0 PS2 18 (SUTURE) ×3 IMPLANT
SUT PDS AB 1 CTX 36 (SUTURE) IMPLANT
SUT PDS AB 1 TP1 96 (SUTURE) IMPLANT
SUT PDS AB 2-0 CT2 27 (SUTURE) IMPLANT
SUT PROLENE 0 CT 2 (SUTURE) ×3 IMPLANT
SUT PROLENE 2 0 KS (SUTURE) ×3 IMPLANT
SUT PROLENE 2 0 SH DA (SUTURE) IMPLANT
SUT SILK 2 0 (SUTURE) ×1
SUT SILK 2 0 SH CR/8 (SUTURE) ×3 IMPLANT
SUT SILK 2-0 18XBRD TIE 12 (SUTURE) ×2 IMPLANT
SUT SILK 3 0 (SUTURE) ×1
SUT SILK 3 0 SH CR/8 (SUTURE) ×3 IMPLANT
SUT SILK 3-0 18XBRD TIE 12 (SUTURE) ×2 IMPLANT
SUT V-LOC BARB 180 2/0GR6 GS22 (SUTURE)
SUT VIC AB 3-0 SH 18 (SUTURE) ×3 IMPLANT
SUT VIC AB 3-0 SH 27 (SUTURE) ×1
SUT VIC AB 3-0 SH 27XBRD (SUTURE) ×2 IMPLANT
SUT VICRYL 0 UR6 27IN ABS (SUTURE) ×6 IMPLANT
SUTURE V-LC BRB 180 2/0GR6GS22 (SUTURE) IMPLANT
SYR 10ML LL (SYRINGE) ×3 IMPLANT
SYS LAPSCP GELPORT 120MM (MISCELLANEOUS)
SYSTEM LAPSCP GELPORT 120MM (MISCELLANEOUS) IMPLANT
TAPE UMBILICAL COTTON 1/8X30 (MISCELLANEOUS) ×3 IMPLANT
TOWEL OR 17X26 10 PK STRL BLUE (TOWEL DISPOSABLE) IMPLANT
TOWEL OR NON WOVEN STRL DISP B (DISPOSABLE) ×3 IMPLANT
TRAY FOLEY CATH 14FRSI W/METER (CATHETERS) ×3 IMPLANT
TRAY FOLEY W/METER SILVER 16FR (SET/KITS/TRAYS/PACK) ×3 IMPLANT
TROCAR ADV FIXATION 5X100MM (TROCAR) ×3 IMPLANT
TUBING CONNECTING 10 (TUBING) IMPLANT
TUBING INSUFFLATION 10FT LAP (TUBING) ×3 IMPLANT

## 2017-06-06 NOTE — Transfer of Care (Signed)
Immediate Anesthesia Transfer of Care Note  Patient: [name]  Procedure(s) Performed: Procedure(s) with comments: XI ROBOTIC SIGMOID COLECTOMY (N/A) - ERAS PATHWAY RIGID PROCTOSCOPY (N/A)  Patient Location: PACU  Anesthesia Type:General  Level of Consciousness:  sedated, patient cooperative and responds to stimulation  Airway & Oxygen Therapy:Patient Spontanous Breathing and Patient connected to face mask oxgen  Post-op Assessment:  Report given to PACU RN and Post -op Vital signs reviewed and stable  Post vital signs:  Reviewed and stable  Last Vitals:  Vitals:   06/06/17 0526  BP: (!) 154/86  Pulse: 85  Resp: 16  Temp: 36.8 C  SpO2: 912%    Complications: No apparent anesthesia complications

## 2017-06-06 NOTE — Anesthesia Postprocedure Evaluation (Signed)
Anesthesia Post Note  Patient: [name]  Procedure(s) Performed: Procedure(s) (LRB): XI ROBOTIC SIGMOID COLECTOMY (N/A) RIGID PROCTOSCOPY (N/A)     Patient location during evaluation: PACU Anesthesia Type: General Level of consciousness: awake and alert Pain management: pain level controlled Vital Signs Assessment: post-procedure vital signs reviewed and stable Respiratory status: spontaneous breathing, nonlabored ventilation, respiratory function stable and patient connected to nasal cannula oxygen Cardiovascular status: blood pressure returned to baseline and stable Postop Assessment: no signs of nausea or vomiting Anesthetic complications: no    Last Vitals:  Vitals:   06/06/17 1045 06/06/17 1100  BP: 138/81 140/72  Pulse: 97 92  Resp: 12 12  Temp:  36.4 C  SpO2: 95% 100%    Last Pain:  Vitals:   06/06/17 1100  TempSrc:   PainSc: 3                  [name] A.

## 2017-06-06 NOTE — Anesthesia Procedure Notes (Signed)
Procedure Name: Intubation Date/Time: 06/06/2017 7:38 AM Performed by: Maxwell Caul Pre-anesthesia Checklist: Patient identified, Emergency Drugs available, Suction available and Patient being monitored Patient Re-evaluated:Patient Re-evaluated prior to induction Oxygen Delivery Method: Circle system utilized Preoxygenation: Pre-oxygenation with 100% oxygen Induction Type: IV induction, Rapid sequence and Cricoid Pressure applied Laryngoscope Size: Mac and 4 Grade View: Grade I Tube type: Oral Tube size: 7.5 mm Number of attempts: 1 Airway Equipment and Method: Stylet Placement Confirmation: ETT inserted through vocal cords under direct vision,  positive ETCO2 and breath sounds checked- equal and bilateral Secured at: 21 cm Tube secured with: Tape Dental Injury: Teeth and Oropharynx as per pre-operative assessment

## 2017-06-06 NOTE — H&P (Signed)
[name] DOB: 1951-04-18   History of Present Illness Charlotte Hector MD; 04/15/2017 12:12 PM) The patient is a 66 year old female who presents with diverticulitis. Note for "Diverticulitis": ` ` ` Patient sent for surgical consultation at the request of Dr. Zenovia Jarred, [facility]  Patient Care Team: Eber Hong, MD as PCP - General (Internal Medicine) Michael Boston, MD as Consulting Physician (Colon and Rectal Surgery) Johnathan Hausen, MD as Consulting Physician (General Surgery) Pyrtle, Lajuan Lines, MD as Consulting Physician (Gastroenterology)   Chief Complaint: Recurrent sigmoid diverticulitis with chronic pain  The patient is a pleasant female that has had attacks of sigmoid diverticulitis in the past. She is pretty certain she was diagnosed with irritable bowel syndrome many many years ago. Occasionally has a regular bowels but tends to move her bowels every day. She's had episodes of diverticulitis where she will get sharp very low back pain that radiates to her suprapubic region. Usually resolves with antibiotics. The point she cannot tolerate Cipro anymore. She said semi-attacks she can remember. She does not recall episode of perforation or needing a drain placement. Usually they are anointed not severe and she tries to read them out.  She had an episode of very intense pain that scared her 3 months ago. She went to the emergency room to confirm diverticulitis. Nothing else was noted. She followed up with gastroenterology. She was due for a 10 year follow-up endoscopy. Endoscopy showed severe diverticulosis in her sigmoid colon with some tortuousness. However no other abnormalities noted. Because of her frequent attacks, lack of any other etiology for her symptoms, and now chronic soreness, surgical consultation recommended for elective colectomy.  Patient notes that she usually can control her bowels with a line probiotic. She is poor tolerance to any shells  or seeds. Has to focus on bland foods. Can tolerate most proteins and soft well cooked peeled vegetables. Has some low back pain issues controlled with a chiropractor. That is been stable. Of a large hiatal hernia was repaired by my partner, Dr. Hassell Done about 10 years ago. She had a bad coughing wrenching episode with pneumonia that broke it down. She had a redo fundoplication done in 8921. She recovered from that well. Denies any dysphagia or other issues from that.  Ready for surgery  (Review of systems as stated in this history (HPI) or in the review of systems. Otherwise all other 12 point ROS are negative)   Allergies Dalbert Mayotte, CMA; 04/15/2017 11:08 AM) Codeine/Codeine Derivatives Tetracyclines Alendronate Sodium *ENDOCRINE AND METABOLIC AGENTS - MISC.* Ciprofloxacin *FLUOROQUINOLONES* Allergies Reconciled  Medication History Dalbert Mayotte, CMA; 04/15/2017 11:12 AM) TraMADol HCl (50MG  Tablet, Oral) Active. Fish Oil Concentrate (435MG  Capsule, 1 (one) Oral) Active. Magnesium (250MG  Tablet, Oral) Active. Align (Oral) Active. Pepcid (10MG /ML Solution, Intravenous) Active. Baby Aspirin (81MG  Tablet Chewable, Oral) Active. ValACYclovir HCl (1GM Tablet, Oral) Active. Raloxifene HCl (60MG  Tablet, Oral) Active. Calcium (600MG  Capsule, Oral) Active. Tylenol (1 (one) Oral) Specific strength unknown - Active. Dicyclomine HCl (20MG  Tablet, Oral) Active. Azithromycin (250MG  Tablet, Oral) Active. Cefuroxime Axetil (250MG  Tablet, Oral) Active. Hydrochlorothiazide (25MG  Tablet, Oral) Active. Levothyroxine Sodium (88MCG Tablet, Oral) Active. Ondansetron HCl (4MG  Tablet, Oral) Active. Promethegan (25MG  Suppository, Rectal) Active. Xifaxan (550MG  Tablet, Oral) Active. Medications Reconciled    Vitals Dalbert Mayotte CMA; 04/15/2017 11:12 AM) 04/15/2017 11:12 AM Weight: 150 lb Height: 63in Body Surface Area: 1.71 m Body Mass Index: 26.57  kg/m  Temp.: 97.55F  Pulse: 101 (Regular)  BP: 142/90 (Sitting, Left  Arm, Standard)  BP (!) 154/86   Pulse 85   Temp 98.2 F (36.8 C) (Oral)   Resp 16   Ht 5\' 4"  (1.626 m)   Wt 68 kg (150 lb)   SpO2 100%   BMI 25.75 kg/m      Physical Exam Charlotte Hector MD; 04/15/2017 12:13 PM)  General Mental Status-Alert. General Appearance-Not in acute distress, Not Sickly. Orientation-Oriented X3. Hydration-Well hydrated. Voice-Normal. Note: Sitting around smiling. Pleasant.  Integumentary Global Assessment Upon inspection and palpation of skin surfaces of the - Axillae: non-tender, no inflammation or ulceration, no drainage. and Distribution of scalp and body hair is normal. General Characteristics Temperature - normal warmth is noted.  Head and Neck Head-normocephalic, atraumatic with no lesions or palpable masses. Face Global Assessment - atraumatic, no absence of expression. Neck Global Assessment - no abnormal movements, no bruit auscultated on the right, no bruit auscultated on the left, no decreased range of motion, non-tender. Trachea-midline. Thyroid Gland Characteristics - non-tender.  Eye Eyeball - Left-Extraocular movements intact, No Nystagmus. Eyeball - Right-Extraocular movements intact, No Nystagmus. Cornea - Left-No Hazy. Cornea - Right-No Hazy. Sclera/Conjunctiva - Left-No scleral icterus, No Discharge. Sclera/Conjunctiva - Right-No scleral icterus, No Discharge. Pupil - Left-Direct reaction to light normal. Pupil - Right-Direct reaction to light normal.  ENMT Ears Pinna - Left - no drainage observed, no generalized tenderness observed. Right - no drainage observed, no generalized tenderness observed. Nose and Sinuses External Inspection of the Nose - no destructive lesion observed. Inspection of the nares - Left - quiet respiration. Right - quiet respiration. Mouth and Throat Lips - Upper Lip - no  fissures observed, no pallor noted. Lower Lip - no fissures observed, no pallor noted. Nasopharynx - no discharge present. Oral Cavity/Oropharynx - Tongue - no dryness observed. Oral Mucosa - no cyanosis observed. Hypopharynx - no evidence of airway distress observed.  Chest and Lung Exam Inspection Movements - Normal and Symmetrical. Accessory muscles - No use of accessory muscles in breathing. Palpation Palpation of the chest reveals - Non-tender. Auscultation Breath sounds - Normal and Clear.  Cardiovascular Auscultation Rhythm - Regular. Murmurs & Other Heart Sounds - Auscultation of the heart reveals - No Murmurs and No Systolic Clicks.  Abdomen Inspection Inspection of the abdomen reveals - No Visible peristalsis and No Abnormal pulsations. Umbilicus - No Bleeding, No Urine drainage. Palpation/Percussion Palpation and Percussion of the abdomen reveal - Soft, Non Tender, No Rebound tenderness, No Rigidity (guarding) and No Cutaneous hyperesthesia. Note: Abdomen soft. Sensitive soreness especially in suprapubic region and slightly right lower quadrant. Less so on the left side. No diffuse peritonitis. No diastases. Old laparoscopic port sites clean without hernia. Not severely distended. No distasis recti. No umbilical or other anterior abdominal wall hernias  Female Genitourinary Sexual Maturity Tanner 5 - Adult hair pattern. Note: No vaginal bleeding nor discharge  Peripheral Vascular Upper Extremity Inspection - Left - No Cyanotic nailbeds, Not Ischemic. Right - No Cyanotic nailbeds, Not Ischemic.  Neurologic Neurologic evaluation reveals -normal attention span and ability to concentrate, able to name objects and repeat phrases. Appropriate fund of knowledge , normal sensation and normal coordination. Mental Status Affect - not angry, not paranoid. Cranial Nerves-Normal Bilaterally. Gait-Normal.  Neuropsychiatric Mental status exam performed with  findings of-able to articulate well with normal speech/language, rate, volume and coherence, thought content normal with ability to perform basic computations and apply abstract reasoning and no evidence of hallucinations, delusions, obsessions or homicidal/suicidal ideation.  Musculoskeletal Global Assessment  Spine, Ribs and Pelvis - no instability, subluxation or laxity. Right Upper Extremity - no instability, subluxation or laxity.  Lymphatic Head & Neck  General Head & Neck Lymphatics: Bilateral - Description - No Localized lymphadenopathy. Axillary  General Axillary Region: Bilateral - Description - No Localized lymphadenopathy. Femoral & Inguinal  Generalized Femoral & Inguinal Lymphatics: Left - Description - No Localized lymphadenopathy. Right - Description - No Localized lymphadenopathy.    Assessment & Plan Charlotte Hector MD; 04/15/2017 12:14 PM)  DIVERTICULITIS OF LARGE INTESTINE WITHOUT PERFORATION OR ABSCESS WITHOUT BLEEDING (K57.32) Impression: Pleasant woman with chronic lower abdominal and back pain flares and known sigmoid diverticulitis. A prior usually mild attacks with one severe attack 3 months ago. It is not really resolved. She's had chronic soreness for the past 3 months. Nothing else in the differential diagnosis suspicious. Evidence of severe diverticulosis and some torturous nest suspicious for chronic stricturing as well.  I think she would benefit from segmental colonic resection. Sigmoid colectomy. Good minimally invasive candidate. Robotically-assisted. She is interested in proceeding. She has excellent exercise tolerance. Stable left bundle branch block but otherwise no cardiopulmonary issues. She is ready for surgery  The anatomy & physiology of the digestive tract was discussed.  The pathophysiology of the colon was discussed.  Natural history risks without surgery was discussed.   I feel the risks of no intervention will lead to serious  problems that outweigh the operative risks; therefore, I recommended a partial colectomy to remove the pathology.  Minimally invasive (Robotic/Laparoscopic) & open techniques were discussed.   Risks such as bleeding, infection, abscess, leak, reoperation, injury to other organs, need for repair of tissues / organs, possible ostomy, hernia, heart attack, stroke, death, and other risks were discussed.  I noted a good likelihood this will help address the problem.   Goals of post-operative recovery were discussed as well.   Need for adequate nutrition, daily bowel regimen and healthy physical activity, to optimize recovery was noted as well. We will work to minimize complications.  Educational materials were available as well.  Questions were answered.  The patient expresses understanding & wishes to proceed with surgery.   Pt Education - [facility] Colectomy post-op instructions: discussed with patient and provided information.  IRRITABLE BOWEL SYNDROME WITH DIARRHEA (K58.0) Impression: Irregular bowels. Most likely exacerbated with repeated diverticulitis and tortuous no some possible rectal stricture.  I am guarded against the idea this will resolve after surgery, but I'm hopeful her bowels will be easier to manage with this. Continue probiotics and long-term management per her gastroenterologist.  Current Plans Pt Education - [facility] IBS patient info: discussed with patient and provided information. Pt Education - [facility] Good Bowel Health (Fallen Crisostomo)  [name], M.D., F.A.C.S. Gastrointestinal and Minimally Invasive Surgery [facility] [address] [place], [place] [address] [phone] Main / Paging

## 2017-06-06 NOTE — Progress Notes (Signed)
Patient s/p Robotic sigmoid colectomy. Alert and oriented x 3. Oriented to room and enviroenment. Call bell within reach. Vital signs obtained. Will continue to monitor.

## 2017-06-06 NOTE — Op Note (Signed)
06/06/2017  10:03 AM  PATIENT:  [name]  66 y.o. female  Patient Care Team: Eber Hong, MD as PCP - General (Internal Medicine) Michael Boston, MD as Consulting Physician (Colon and Rectal Surgery) Johnathan Hausen, MD as Consulting Physician (General Surgery) Pyrtle, Lajuan Lines, MD as Consulting Physician (Gastroenterology)  PRE-OPERATIVE DIAGNOSIS:  Recurrent sigmoid diverticulitis  POST-OPERATIVE DIAGNOSIS:  Recurrent sigmoid diverticulitis  PROCEDURE:   XI ROBOTIC SIGMOID COLECTOMY RIGID PROCTOSCOPY  SURGEON:  [name], MD  ASSISTANT: Leighton Ruff, MD, FACS.  ANESTHESIA:   local and general  EBL:  Total I/O In: 1000 [I.V.:1000] Out: 250 [Urine:200; Blood:50]  Delay start of Pharmacological VTE agent (>24hrs) due to surgical blood loss or risk of bleeding:  no  DRAINS: none   SPECIMEN:  Source of Specimen:  RECTOSIGMOID COLON with distal anastomotic ring  DISPOSITION OF SPECIMEN:  PATHOLOGY  COUNTS:  YES  PLAN OF CARE: Admit to inpatient   PATIENT DISPOSITION:  PACU - hemodynamically stable.  INDICATION:    Patient with episodes of recurrent sigmoid diverticulitis  at least overdosing.  Getting the point that she cannot tolerate Cipro or other antibiotics very well.  I recommended segmental resection:  The anatomy & physiology of the digestive tract was discussed.  The pathophysiology was discussed.  Natural history risks without surgery was discussed.   I worked to give an overview of the disease and the frequent need to have multispecialty involvement.  I feel the risks of no intervention will lead to serious problems that outweigh the operative risks; therefore, I recommended a partial colectomy to remove the pathology.  Laparoscopic & open techniques were discussed.   Risks such as bleeding, infection, abscess, leak, reoperation, possible ostomy, hernia, heart attack, death, and other risks were discussed.  I noted a good likelihood this will help  address the problem.   Goals of post-operative recovery were discussed as well.  We will work to minimize complications.  Educational materials on the pathology had been given in the office.  Questions were answered.    The patient expressed understanding & wished to proceed with surgery.  OR FINDINGS:   Patient had inflamed distal sigmoid adherent to the anterior pelvis with thickening insistent with chronic sigmoid diverticulitis.  Stretched out sigmoid colon proximally.  No obvious metastatic disease on visceral parietal peritoneum or liver.  No significant intra-abdominal adhesions otherwise despite prior laparoscopic surgeries.  The anastomosis rests 12 cm from the anal verge by rigid proctoscopy.  DESCRIPTION:   Informed consent was confirmed.  The patient underwent general anaesthesia without difficulty.  The patient was positioned appropriately.  VTE prevention in place.  [name] abdomen was clipped, prepped, & draped in a sterile fashion.  Surgical timeout confirmed our plan.  The patient was positioned in reverse Trendelenburg.  Abdominal entry was gained using Varess technique with a trach hook on the anterior abdominal wall fascia in the right upper abdomen.  Entry was clean.  I induced carbon dioxide insufflation.  Camera inspection revealed no injury.  Extra ports were carefully placed under direct laparoscopic visualization.  I reflected the greater omentum and the upper abdomen the small bowel in the upper abdomen.  The patient was carefully positioned.  The Intuitive daVinci robot was carefully docked with camera & instruments carefully placed.  The patient had inflamed distal sigmoid adherent to the left anterior pelvis and along the vaginal cuff.  I mobilized the rectosigmoid colon off the pelvis and a lateral to medial fashion.  Most adhesions rather thin with a few thicker bands.  With that is able to better mobilize the rectosigmoid colon.    I scored the base of  peritoneum of the medial side of the mesentery of the left colon from the ligament of Treitz to the peritoneal reflection of the mid rectum.   I elevated the sigmoid mesentery and entered into the retro-mesenteric plane. We were able to identify the left ureter and gonadal vessels. We kept those posterior within the retroperitoneum and elevated the left colon mesentery off that. I did isolate the inferior mesenteric artery (IMA) pedicle but did not ligate it yet.  I continued distally and got into the avascular plane posterior to the mesorectum. This allowed me to help mobilize the rectum as well by freeing the mesorectum off the sacrum.  I mobilized the peritoneal coverings towards the peritoneal reflection on both the right and left sides of the rectum.  I stayed away from the right and left ureters.  I kept the lateral vascular pedicles to the rectum intact.  I skeletonized the lymph nodes off the inferior mesenteric artery pedicle.  I went down to its takeoff from the aorta.  I isolated the inferior mesenteric vein off of the ligament of Treitz just cephalad to that as well.  After confirming the left ureter was out of the way, I went ahead and ligated the inferior mesenteric artery pedicle just near its takeoff from the aorta.   I did ligate the inferior mesenteric vein in a similar fashion.  I worked to try and preserve a left colic pedicle. We ensured hemostasis. I skeletonized the mesorectum at the junction at the proximal rectum for the distal point of resection.  I mobilized the left colon in a lateral to medial fashion off the line of Toldt up towards the splenic flexure to ensure good mobilization of the remaining left colon to reach into the pelvis.  She had some redundant stretched out folded left colon so the mid descending colon easily reached down.  I skeletonized at the proximal mesorectum and transected at the proximal rectum using a robotic 45 mm stapler.  90% across with first firing.   Finished with a second firing at the left lateral corner I chose a region at the descending/sigmoid junction that was soft and easily reached down to the rectal stump.  I transected the mesentery of the colon radially to preserve remaining colon blood supply.  I created an extraction incision through a small Pfannenstiel incision in the suprapubic region.  Placed a wound protector.  I was able to eviscerate the rectosigmoid and descending colon out the wound.   I clamped the colon proximal to this area using a reusable pursestringer device.  Passed a 2-0 Keith needle. I transected at the descending/sigmoid junction with a scalpel. I got healthy bleeding mucosa.  We sent the rectosigmoid colon specimen off to go to pathology.  We sized the colon orifice.  I chose a 33 EEA anvil stapler system.  I reinforced the prolene pursestring with interrupted silk suture.  I placed the anvil to the open end of the proximal remaining colon and closed around it using the pursestring.    We did copious irrigation with crystalloid solution.  Hemostasis was good.  The distal end of the remaining colon easily reached down to the rectal stump, therefore, splenic flexure mobilization was not needed.      Leighton Ruff, MD, scrubbed down and did gentle anal dilation and advanced the EEA  stapler up the rectal stump. The spike was brought out at the provimal end of the rectal stump under direct visualization.  I attached the anvil of the proximal colon the spike of the stapler. Anvil was tightened down and held clamped for 60 seconds. The EEA stapler was fired and held clamped for 30 seconds. The stapler was released & removed. We noted 2 excellent anastomotic rings. Blue stitch is in the proximal ring.  Dr Max Fickle did rigid proctoscopy noted the anastomosis was at 12 cm from the anal verge consistent with the proximal rectum.  We did a final irrigation of antibiotic solution (900 mg clindamycin/240 mg gentamicin in a liter of  crystalloid) & held that for the pelvic air leak test .  The rectum was insufflated the rectum while clamping the colon proximal to that anastomosis.  There was a negative air leak test. There was no tension of mesentery or bowel at the anastomosis.   Tissues looked viable.  Ureters & bowel uninjured.  The anastomosis looked healthy.  I closed the 12 mm stapler port site with a 0 Vicryl using a laparoscopic suture passer under direct visualization.  Endoluminal gas was evacuated.  Ports & wound protector removed.  We changed gloves & redraped the patient per colon SSI prevention protocol.  We aspirated the antibiotic irrigation.  Hemostasis was good.  Sterile unused instruments were used from this point.  I closed the skin at the port sites using Monocryl stitch and sterile dressing.  I closed the extraction wound using a 0 Vicryl vertical peritoneal closure and a #1 PDS transverse anterior rectal fascial closure like a small Pfannenstiel closure. I closed the skin with some interrupted Monocryl stitches. I placed antibiotic-soaked wicks into the closure at the corners x2.  I placed sterile dressings.     Patient is being extubated go to recovery room. I had discussed postop care with the patient in detail the office & in the holding area. Instructions are written. I discussed operative findings, updated the patient's status, discussed probable steps to recovery, and gave postoperative recommendations to the patient's family.  Recommendations were made.  Questions were answered.  They expressed understanding & appreciation.   [name], M.D., F.A.C.S. Gastrointestinal and Minimally Invasive Surgery [facility] [address], [place], [place] [address] [phone] Main / Paging

## 2017-06-06 NOTE — Discharge Instructions (Signed)
SURGERY: POST OP INSTRUCTIONS °(Surgery for small bowel obstruction, colon resection, etc) ° ° °###################################################################### ° °EAT °Gradually transition to a high fiber diet with a fiber supplement over the next few days after discharge ° °WALK °Walk an hour a day.  Control your pain to do that.   ° °CONTROL PAIN °Control pain so that you can walk, sleep, tolerate sneezing/coughing, go up/down stairs. ° °HAVE A BOWEL MOVEMENT DAILY °Keep your bowels regular to avoid problems.  OK to try a laxative to override constipation.  OK to use an antidairrheal to slow down diarrhea.  Call if not better after 2 tries ° °CALL IF YOU HAVE PROBLEMS/CONCERNS °Call if you are still struggling despite following these instructions. °Call if you have concerns not answered by these instructions ° °###################################################################### ° ° °DIET °Follow a light diet the first few days at home.  Start with a bland diet such as soups, liquids, starchy foods, low fat foods, etc.  If you feel full, bloated, or constipated, stay on a ful liquid or pureed/blenderized diet for a few days until you feel better and no longer constipated. °Be sure to drink plenty of fluids every day to avoid getting dehydrated (feeling dizzy, not urinating, etc.). °Gradually add a fiber supplement to your diet over the next week.  Gradually get back to a regular solid diet.  Avoid fast food or heavy meals the first week as you are more likely to get nauseated. °It is expected for your digestive tract to need a few months to get back to normal.  It is common for your bowel movements and stools to be irregular.  You will have occasional bloating and cramping that should eventually fade away.  Until you are eating solid food normally, off all pain medications, and back to regular activities; your bowels will not be normal. °Focus on eating a low-fat, high fiber diet the rest of your life  (See Getting to Good Bowel Health, below). ° °CARE of your INCISION or WOUND °It is good for closed incision and even open wounds to be washed every day.  Shower every day.  Short baths are fine.  Wash the incisions and wounds clean with soap & water.    °If you have a closed incision(s), wash the incision with soap & water every day.  You may leave closed incisions open to air if it is dry.   You may cover the incision with clean gauze & replace it after your daily shower for comfort. °If you have skin tapes (Steristrips) or skin glue (Dermabond) on your incision, leave them in place.  They will fall off on their own like a scab.  You may trim any edges that curl up with clean scissors.  If you have staples, set up an appointment for them to be removed in the office in 10 days after surgery.  °If you have a drain, wash around the skin exit site with soap & water and place a new dressing of gauze or band aid around the skin every day.  Keep the drain site clean & dry.    °If you have an open wound with packing, see wound care instructions.  In general, it is encouraged that you remove your dressing and packing, shower with soap & water, and replace your dressing once a day.  Pack the wound with clean gauze moistened with normal (0.9%) saline to keep the wound moist & uninfected.  Pressure on the dressing for 30 minutes will stop most wound   bleeding.  Eventually your body will heal & pull the open wound closed over the next few months.  °Raw open wounds will occasionally bleed or secrete yellow drainage until it heals closed.  Drain sites will drain a little until the drain is removed.  Even closed incisions can have mild bleeding or drainage the first few days until the skin edges scab over & seal.   °If you have an open wound with a wound vac, see wound vac care instructions. ° ° ° ° °ACTIVITIES as tolerated °Start light daily activities --- self-care, walking, climbing stairs-- beginning the day after surgery.   Gradually increase activities as tolerated.  Control your pain to be active.  Stop when you are tired.  Ideally, walk several times a day, eventually an hour a day.   °Most people are back to most day-to-day activities in a few weeks.  It takes 4-8 weeks to get back to unrestricted, intense activity. °If you can walk 30 minutes without difficulty, it is safe to try more intense activity such as jogging, treadmill, bicycling, low-impact aerobics, swimming, etc. °Save the most intensive and strenuous activity for last (Usually 4-8 weeks after surgery) such as sit-ups, heavy lifting, contact sports, etc.  Refrain from any intense heavy lifting or straining until you are off narcotics for pain control.  You will have off days, but things should improve week-by-week. °DO NOT PUSH THROUGH PAIN.  Let pain be your guide: If it hurts to do something, don't do it.  Pain is your body warning you to avoid that activity for another week until the pain goes down. °You may drive when you are no longer taking narcotic prescription pain medication, you can comfortably wear a seatbelt, and you can safely make sudden turns/stops to protect yourself without hesitating due to pain. °You may have sexual intercourse when it is comfortable. If it hurts to do something, stop. ° °MEDICATIONS °Take your usually prescribed home medications unless otherwise directed.   °Blood thinners:  °Usually you can restart any strong blood thinners after the second postoperative day.  It is OK to take aspirin right away.    ° If you are on strong blood thinners (warfarin/Coumadin, Plavix, Xerelto, Eliquis, Pradaxa, etc), discuss with your surgeon, medicine PCP, and/or cardiologist for instructions on when to restart the blood thinner & if blood monitoring is needed (PT/INR blood check, etc).   ° ° °PAIN CONTROL °Pain after surgery or related to activity is often due to strain/injury to muscle, tendon, nerves and/or incisions.  This pain is usually  short-term and will improve in a few months.  °To help speed the process of healing and to get back to regular activity more quickly, DO THE FOLLOWING THINGS TOGETHER: °1. Increase activity gradually.  DO NOT PUSH THROUGH PAIN °2. Use Ice and/or Heat °3. Try Gentle Massage and/or Stretching °4. Take over the counter pain medication °5. Take Narcotic prescription pain medication for more severe pain ° °Good pain control = faster recovery.  It is better to take more medicine to be more active than to stay in bed all day to avoid medications. °1.  Increase activity gradually °Avoid heavy lifting at first, then increase to lifting as tolerated over the next 6 weeks. °Do not “push through” the pain.  Listen to your body and avoid positions and maneuvers than reproduce the pain.  Wait a few days before trying something more intense °Walking an hour a day is encouraged to help your body recover faster   and more safely.  Start slowly and stop when getting sore.  If you can walk 30 minutes without stopping or pain, you can try more intense activity (running, jogging, aerobics, cycling, swimming, treadmill, sex, sports, weightlifting, etc.) °Remember: If it hurts to do it, then don’t do it! °2. Use Ice and/or Heat °You will have swelling and bruising around the incisions.  This will take several weeks to resolve. °Ice packs or heating pads (6-8 times a day, 30-60 minutes at a time) will help sooth soreness & bruising. °Some people prefer to use ice alone, heat alone, or alternate between ice & heat.  Experiment and see what works best for you.  Consider trying ice for the first few days to help decrease swelling and bruising; then, switch to heat to help relax sore spots and speed recovery. °Shower every day.  Short baths are fine.  It feels good!  Keep the incisions and wounds clean with soap & water.   °3. Try Gentle Massage and/or Stretching °Massage at the area of pain many times a day °Stop if you feel pain - do not  overdo it °4. Take over the counter pain medication °This helps the muscle and nerve tissues become less irritable and calm down faster °Choose ONE of the following over-the-counter anti-inflammatory medications: °Acetaminophen 500mg tabs (Tylenol) 1-2 pills with every meal and just before bedtime (avoid if you have liver problems or if you have acetaminophen in you narcotic prescription) °Naproxen 220mg tabs (ex. Aleve, Naprosyn) 1-2 pills twice a day (avoid if you have kidney, stomach, IBD, or bleeding problems) °Ibuprofen 200mg tabs (ex. Advil, Motrin) 3-4 pills with every meal and just before bedtime (avoid if you have kidney, stomach, IBD, or bleeding problems) °Take with food/snack several times a day as directed for at least 2 weeks to help keep pain / soreness down & more manageable. °5. Take Narcotic prescription pain medication for more severe pain °A prescription for strong pain control is often given to you upon discharge (for example: oxycodone/Percocet, hydrocodone/Norco/Vicodin, or tramadol/Ultram) °Take your pain medication as prescribed. °Be mindful that most narcotic prescriptions contain Tylenol (acetaminophen) as well - avoid taking too much Tylenol. °If you are having problems/concerns with the prescription medicine (does not control pain, nausea, vomiting, rash, itching, etc.), please call us [phone] to see if we need to switch you to a different pain medicine that will work better for you and/or control your side effects better. °If you need a refill on your pain medication, you must call the office before 4 pm and on weekdays only.  By federal law, prescriptions for narcotics cannot be called into a pharmacy.  They must be filled out on paper & picked up from our office by the patient or authorized caretaker.  Prescriptions cannot be filled after 4 pm nor on weekends.   ° °WHEN TO CALL US [phone] °Severe uncontrolled or worsening pain  °Fever over 101 F (38.5 C) °Concerns with  the incision: Worsening pain, redness, rash/hives, swelling, bleeding, or drainage °Reactions / problems with new medications (itching, rash, hives, nausea, etc.) °Nausea and/or vomiting °Difficulty urinating °Difficulty breathing °Worsening fatigue, dizziness, lightheadedness, blurred vision °Other concerns °If you are not getting better after two weeks or are noticing you are getting worse, contact our office [phone] for further advice.  We may need to adjust your medications, re-evaluate you in the office, send you to the emergency room, or see what other things we can do to help. °The   clinic staff is available to answer your questions during regular business hours (8:30am-5pm).  Please don’t hesitate to call and ask to speak to one of our nurses for clinical concerns.    °A surgeon from [facility] is always on call at the hospitals 24 hours/day °If you have a medical emergency, go to the nearest emergency room or call 911. ° °FOLLOW UP in our office °One the day of your discharge from the hospital (or the next business weekday), please call [facility] to set up or confirm an appointment to see your surgeon in the office for a follow-up appointment.  Usually it is 2-3 weeks after your surgery.   °If you have skin staples at your incision(s), let the office know so we can set up a time in the office for the nurse to remove them (usually around 10 days after surgery). °Make sure that you call for appointments the day of discharge (or the next business weekday) from the hospital to ensure a convenient appointment time. °IF YOU HAVE DISABILITY OR FAMILY LEAVE FORMS, BRING THEM TO THE OFFICE FOR PROCESSING.  DO NOT GIVE THEM TO YOUR DOCTOR. ° °[facility] °[address], [address], [place], [place]  [address] ? °[phone] - Main °[phone] - Toll Free,  [phone] - Fax °[email or web address] ° °GETTING TO GOOD BOWEL HEALTH. °It is  expected for your digestive tract to need a few months to get back to normal.  It is common for your bowel movements and stools to be irregular.  You will have occasional bloating and cramping that should eventually fade away.  Until you are eating solid food normally, off all pain medications, and back to regular activities; your bowels will not be normal.   °Avoiding constipation °The goal: ONE SOFT BOWEL MOVEMENT A DAY!    °Drink plenty of fluids.  Choose water first. °TAKE A FIBER SUPPLEMENT EVERY DAY THE REST OF YOUR LIFE °During your first week back home, gradually add back a fiber supplement every day °Experiment which form you can tolerate.   There are many forms such as powders, tablets, wafers, gummies, etc °Psyllium bran (Metamucil), methylcellulose (Citrucel), Miralax or Glycolax, Benefiber, Flax Seed.  °Adjust the dose week-by-week (1/2 dose/day to 6 doses a day) until you are moving your bowels 1-2 times a day.  Cut back the dose or try a different fiber product if it is giving you problems such as diarrhea or bloating. °Sometimes a laxative is needed to help jump-start bowels if constipated until the fiber supplement can help regulate your bowels.  If you are tolerating eating & you are farting, it is okay to try a gentle laxative such as double dose MiraLax, prune juice, or Milk of Magnesia.  Avoid using laxatives too often. °Stool softeners can sometimes help counteract the constipating effects of narcotic pain medicines.  It can also cause diarrhea, so avoid using for too long. °If you are still constipated despite taking fiber daily, eating solids, and a few doses of laxatives, call our office. °Controlling diarrhea °Try drinking liquids and eating bland foods for a few days to avoid stressing your intestines further. °Avoid dairy products (especially milk & ice cream) for a short time.  The intestines often can lose the ability to digest lactose when stressed. °Avoid foods that cause gassiness or  bloating.  Typical foods include beans and other legumes, cabbage, broccoli, and dairy foods.  Avoid greasy, spicy, fast foods.  Every person has   some sensitivity to other foods, so listen to your body and avoid those foods that trigger problems for you. °Probiotics (such as active yogurt, Align, etc) may help repopulate the intestines and colon with normal bacteria and calm down a sensitive digestive tract °Adding a fiber supplement gradually can help thicken stools by absorbing excess fluid and retrain the intestines to act more normally.  Slowly increase the dose over a few weeks.  Too much fiber too soon can backfire and cause cramping & bloating. °It is okay to try and slow down diarrhea with a few doses of antidiarrheal medicines.   °Bismuth subsalicylate (ex. Kayopectate, Pepto Bismol) for a few doses can help control diarrhea.  Avoid if pregnant.   °Loperamide (Imodium) can slow down diarrhea.  Start with one tablet (2mg) first.  Avoid if you are having fevers or severe pain.  °ILEOSTOMY PATIENTS WILL HAVE CHRONIC DIARRHEA since their colon is not in use.    °Drink plenty of liquids.  You will need to drink even more glasses of water/liquid a day to avoid getting dehydrated. °Record output from your ileostomy.  Expect to empty the bag every 3-4 hours at first.  Most people with a permanent ileostomy empty their bag 4-6 times at the least.   °Use antidiarrheal medicine (especially Imodium) several times a day to avoid getting dehydrated.  Start with a dose at bedtime & breakfast.  Adjust up or down as needed.  Increase antidiarrheal medications as directed to avoid emptying the bag more than 8 times a day (every 3 hours). °Work with your wound ostomy nurse to learn care for your ostomy.  See ostomy care instructions. °TROUBLESHOOTING IRREGULAR BOWELS °1) Start with a soft & bland diet. No spicy, greasy, or fried foods.  °2) Avoid gluten/wheat or dairy products from diet to see if symptoms improve. °3) Miralax  17gm or flax seed mixed in 8oz. water or juice-daily. May use 2-4 times a day as needed. °4) Gas-X, Phazyme, etc. as needed for gas & bloating.  °5) Prilosec (omeprazole) over-the-counter as needed °6)  Consider probiotics (Align, Activa, etc) to help calm the bowels down ° °Call your doctor if you are getting worse or not getting better.  Sometimes further testing (cultures, endoscopy, X-ray studies, CT scans, bloodwork, etc.) may be needed to help diagnose and treat the cause of the diarrhea. °[facility] °[address], [address], [place], [place]  [address] °[phone] - Main.    °[phone]  - Toll Free.   [phone] - Fax °[email or web address] ° ° °Diverticulitis °Diverticulitis is inflammation or infection of small pouches in your colon that form when you have a condition called diverticulosis. The pouches in your colon are called diverticula. Your colon, or large intestine, is where water is absorbed and stool is formed. °Complications of diverticulitis can include: °· Bleeding. °· Severe infection. °· Severe pain. °· Perforation of your colon. °· Obstruction of your colon. °What are the causes? °Diverticulitis is caused by bacteria. °Diverticulitis happens when stool becomes trapped in diverticula. This allows bacteria to grow in the diverticula, which can lead to inflammation and infection. °What increases the risk? °People with diverticulosis are at risk for diverticulitis. Eating a diet that does not include enough fiber from fruits and vegetables may make diverticulitis more likely to develop. °What are the signs or symptoms? °Symptoms of diverticulitis may include: °· Abdominal pain and tenderness. The pain is normally located on the left side of the abdomen, but may occur   in other areas. °· Fever and chills. °· Bloating. °· Cramping. °· Nausea. °· Vomiting. °· Constipation. °· Diarrhea. °· Blood in your stool. °How is this diagnosed? °Your health care  provider will ask you about your medical history and do a physical exam. You may need to have tests done because many medical conditions can cause the same symptoms as diverticulitis. Tests may include: °· Blood tests. °· Urine tests. °· Imaging tests of the abdomen, including X-rays and CT scans. °When your condition is under control, your health care provider may recommend that you have a colonoscopy. A colonoscopy can show how severe your diverticula are and whether something else is causing your symptoms. °How is this treated? °Most cases of diverticulitis are mild and can be treated at home. Treatment may include: °· Taking over-the-counter pain medicines. °· Following a clear liquid diet. °· Taking antibiotic medicines by mouth for 7-10 days. °More severe cases may be treated at a hospital. Treatment may include: °· Not eating or drinking. °· Taking prescription pain medicine. °· Receiving antibiotic medicines through an IV tube. °· Receiving fluids and nutrition through an IV tube. °· Surgery. °Follow these instructions at home: °· Follow your health care provider’s instructions carefully. °· Follow a full liquid diet or other diet as directed by your health care provider. After your symptoms improve, your health care provider may tell you to change your diet. He or she may recommend you eat a high-fiber diet. Fruits and vegetables are good sources of fiber. Fiber makes it easier to pass stool. °· Take fiber supplements or probiotics as directed by your health care provider. °· Only take medicines as directed by your health care provider. °· Keep all your follow-up appointments. °Contact a health care provider if: °· Your pain does not improve. °· You have a hard time eating food. °· Your bowel movements do not return to normal. °Get help right away if: °· Your pain becomes worse. °· Your symptoms do not get better. °· Your symptoms suddenly get worse. °· You have a fever. °· You have repeated  vomiting. °· You have bloody or black, tarry stools. °This information is not intended to replace advice given to you by your health care provider. Make sure you discuss any questions you have with your health care provider. °Document Released: 07/17/2005 Document Revised: 03/14/2016 Document Reviewed: 09/01/2013 °Elsevier Interactive Patient Education © 2017 [employer] ° °

## 2017-06-06 NOTE — Interval H&P Note (Signed)
History and Physical Interval Note:  06/06/2017 7:27 AM  [name]  has presented today for surgery, with the diagnosis of diverticulitis  The various methods of treatment have been discussed with the patient and family. After consideration of risks, benefits and other options for treatment, the patient has consented to  Procedure(s) with comments: XI ROBOT DISTAL SIGMOID COLECTOMY (N/A) - ERAS PATHWAY RIGID PROCTOSCOPY (N/A) as a surgical intervention .  [name] history has been reviewed, patient examined, no change in status, stable for surgery.  I have reviewed the patient's chart and labs.  Questions were answered to the patient's satisfaction.     Arlo Buffone C.

## 2017-06-06 NOTE — Progress Notes (Signed)
Patient encouraged to ambulate, tolerated 50 feet. Foley bag emptied resulted to 100cc,amber colored urine.Will give LR bolus per MD order.

## 2017-06-07 ENCOUNTER — Inpatient Hospital Stay (HOSPITAL_COMMUNITY): Payer: BLUE CROSS/BLUE SHIELD

## 2017-06-07 LAB — BASIC METABOLIC PANEL
Anion gap: 6 (ref 5–15)
BUN: 9 mg/dL (ref 6–20)
CO2: 22 mmol/L (ref 22–32)
Calcium: 8.2 mg/dL — ABNORMAL LOW (ref 8.9–10.3)
Chloride: 98 mmol/L — ABNORMAL LOW (ref 101–111)
Creatinine, Ser: 0.52 mg/dL (ref 0.44–1.00)
GFR calc Af Amer: >60 mL/min (ref >60)
GFR calc non Af Amer: >60 mL/min (ref >60)
Glucose, Bld: 109 mg/dL — ABNORMAL HIGH (ref 65–99)
Potassium: 3.8 mmol/L (ref 3.5–5.1)
Sodium: 126 mmol/L — ABNORMAL LOW (ref 135–145)

## 2017-06-07 LAB — CBC WITH DIFFERENTIAL/PLATELET
Basophils Absolute: 0 10*3/uL (ref 0.0–0.1)
Basophils Relative: 0 %
Eosinophils Absolute: 0 10*3/uL (ref 0.0–0.7)
Eosinophils Relative: 0 %
HCT: 30.6 % — ABNORMAL LOW (ref 36.0–46.0)
Hemoglobin: 10.9 g/dL — ABNORMAL LOW (ref 12.0–15.0)
Lymphocytes Relative: 13 %
Lymphs Abs: 1.6 10*3/uL (ref 0.7–4.0)
MCH: 32.2 pg (ref 26.0–34.0)
MCHC: 35.6 g/dL (ref 30.0–36.0)
MCV: 90.5 fL (ref 78.0–100.0)
Monocytes Absolute: 1.3 10*3/uL — ABNORMAL HIGH (ref 0.1–1.0)
Monocytes Relative: 11 %
Neutro Abs: 9.2 10*3/uL — ABNORMAL HIGH (ref 1.7–7.7)
Neutrophils Relative %: 76 %
Platelets: 255 10*3/uL (ref 150–400)
RBC: 3.38 MIL/uL — ABNORMAL LOW (ref 3.87–5.11)
RDW: 13.1 % (ref 11.5–15.5)
WBC: 12.1 10*3/uL — ABNORMAL HIGH (ref 4.0–10.5)

## 2017-06-07 LAB — MAGNESIUM: Magnesium: 1.5 mg/dL — ABNORMAL LOW (ref 1.7–2.4)

## 2017-06-07 MED ORDER — ACETAMINOPHEN 325 MG PO TABS
650.0000 mg | ORAL_TABLET | Freq: Four times a day (QID) | ORAL | Status: DC
  Administered 2017-06-07 – 2017-06-08 (×7): 650 mg via ORAL
  Filled 2017-06-07 (×8): qty 2

## 2017-06-07 NOTE — Progress Notes (Signed)
Per [facility] Dr. Barry Dienes: Hold Risaquad, Oscal w/D, MV, Lovaza, and hydrocil as well as any other oral meds pt has difficulty with due to pt's nausea and dry heaves. Pt can have 650 mg liquid suspension Tylenol if she can't take the pills.  Place NG tube if nausea and dry heaves persist.

## 2017-06-07 NOTE — Progress Notes (Signed)
Family and nurse tech [place] reporting pt is talking "out of her head" and is confused. Assessed pt for orientation; A&O x3. Pt disoriented to time; states it is Jun 04, 1982.   Paged Dr. Lucia Gaskins. Dr. Lucia Gaskins returned call, but Dr. Barry Dienes was on floor and volunteered to assist. Dr. Lucia Gaskins agreed.   [facility] via Dr. Barry Dienes: CBC w/diff, BMet, Magnesium. D/C Robaxin and Phenegren. Order DG Chest Portable 1 view. Orders placed.

## 2017-06-07 NOTE — Progress Notes (Signed)
50 cc urine output since 2 am denies fullness and pain on bladder area  . Will give IV fluid bolus as per MD order. We will continue to monitor.

## 2017-06-07 NOTE — Progress Notes (Signed)
1 Day Post-Op   Subjective/Chief Complaint: Pt had nausea and low UOP last night.  She also is confused this AM.  Sore in epigastrium (where prior nissen was).  Minimal pain at surgical site.  Family reports neck seems swollen.   Objective: Vital signs in last 24 hours: Temp:  [96.7 F (35.9 C)-99 F (37.2 C)] 99 F (37.2 C) (08/18 0531) Pulse Rate:  [69-99] 85 (08/18 0824) Resp:  [10-18] 18 (08/18 0824) BP: (138-149)/(64-92) 149/72 (08/18 0824) SpO2:  [95 %-100 %] 98 % (08/18 0824) Last BM Date: 06/06/17  Intake/Output from previous day: 08/17 0701 - 08/18 0700 In: 3205 [P.O.:180; I.V.:2525] Out: 700 [Urine:650; Blood:50] Intake/Output this shift: No intake/output data recorded.  General appearance: alert, cooperative and mild distress Resp: clear to auscultation bilaterally Cardio: regular rate and rhythm GI: soft, approp tender, moderate distention.  hypoactive BS. Extremities: extremities normal, atraumatic, no cyanosis or edema Psych:  answers questions mostly appropriately, but trails off into other information.  Lab Results:  No results for input(s): WBC, HGB, HCT, PLT in the last 72 hours. BMET No results for input(s): NA, K, CL, CO2, GLUCOSE, BUN, CREATININE, CALCIUM in the last 72 hours. PT/INR No results for input(s): LABPROT, INR in the last 72 hours. ABG No results for input(s): PHART, HCO3 in the last 72 hours.  Invalid input(s): PCO2, PO2  Studies/Results: No results found.  Anti-infectives: Anti-infectives    Start     Dose/Rate Route Frequency Ordered Stop   06/06/17 1230  valACYclovir (VALTREX) tablet 1,000 mg     1,000 mg Oral Daily 06/06/17 1130     06/06/17 0945  clindamycin (CLEOCIN) 900 mg, gentamicin (GARAMYCIN) 240 mg in sodium chloride 0.9 % 1,000 mL for intraperitoneal lavage  Status:  Discontinued       As needed 06/06/17 0945 06/06/17 1008   06/06/17 0600  clindamycin (CLEOCIN) 900 mg, gentamicin (GARAMYCIN) 240 mg in sodium chloride  0.9 % 1,000 mL for intraperitoneal lavage  Status:  Discontinued      Intraperitoneal To Surgery 06/05/17 1324 06/06/17 1129   06/06/17 0522  cefoTEtan in Dextrose 5% (CEFOTAN) IVPB 2 g     2 g Intravenous On call to O.R. 06/06/17 0522 06/06/17 0800   06/06/17 0522  neomycin (MYCIFRADIN) tablet 1,000 mg  Status:  Discontinued     1,000 mg Oral 3 times per day 06/06/17 0522 06/06/17 1129   06/06/17 0522  metroNIDAZOLE (FLAGYL) tablet 1,000 mg  Status:  Discontinued     1,000 mg Oral 3 times per day 06/06/17 0522 06/06/17 1129      Assessment/Plan: s/p Procedure(s) with comments: XI ROBOTIC SIGMOID COLECTOMY (N/A) - ERAS PATHWAY RIGID PROCTOSCOPY (N/A) leave foley for urinary monitoring.  D/c robaxin and phenergan. Standing tylenol CXR and labs.   If continues to have nausea, may need NGT.   LOS: 1 day    [facility] 06/07/2017

## 2017-06-08 NOTE — Progress Notes (Signed)
2 Days Post-Op   Subjective/Chief Complaint: Pt doing much better today.  She is clear.  She denies n/v.  She is passing a lot of gas.  She has minimal belching.    Objective: Vital signs in last 24 hours: Temp:  [98.8 F (37.1 C)-99.1 F (37.3 C)] 98.9 F (37.2 C) (08/19 0525) Pulse Rate:  [72-85] 83 (08/19 0525) Resp:  [16-18] 16 (08/19 0525) BP: (143-150)/(72-81) 150/81 (08/19 0525) SpO2:  [96 %-100 %] 97 % (08/19 0525) Last BM Date: 06/06/17  Intake/Output from previous day: 08/18 0701 - 08/19 0700 In: 2498.3 [P.O.:240; I.V.:2258.3] Out: 5600 [Urine:5600] Intake/Output this shift: No intake/output data recorded.  General appearance: alert, cooperative and no distress Resp: breathing comfortably Cardio: regular rate and rhythm GI: soft, approp tender, non distended. Extremities: extremities normal, atraumatic, no cyanosis or edema Psych:  Clear MS.  A&O x 3.   Lab Results:   Recent Labs  06/07/17 0843  WBC 12.1*  HGB 10.9*  HCT 30.6*  PLT 255   BMET  Recent Labs  06/07/17 0843  NA 126*  K 3.8  CL 98*  CO2 22  GLUCOSE 109*  BUN 9  CREATININE 0.52  CALCIUM 8.2*   PT/INR No results for input(s): LABPROT, INR in the last 72 hours. ABG No results for input(s): PHART, HCO3 in the last 72 hours.  Invalid input(s): PCO2, PO2  Studies/Results: Dg Chest Port 1 View  Result Date: 06/07/2017 CLINICAL DATA:  Nausea and vomiting.  Shortness of breath. EXAM: PORTABLE CHEST 1 VIEW COMPARISON:  July 21, 2014 FINDINGS: The study is limited due to the low volume portable technique. No pneumothorax. The heart is borderline. The hila are symmetric. The mediastinum is mildly prominent, probably technical in nature. Increased interstitial markings are identified in the lungs. No focal infiltrate. IMPRESSION: 1. Mild increased interstitial prominence could represent vascular crowding versus pulmonary venous congestion. No overt edema. No focal infiltrate. 2. Mild  prominence of the mediastinum is likely technical in nature. Recommend a PA and lateral chest x-ray before discharge. Electronically Signed   By: Dorise Bullion III M.D   On: 06/07/2017 09:06    Anti-infectives: Anti-infectives    Start     Dose/Rate Route Frequency Ordered Stop   06/06/17 1230  valACYclovir (VALTREX) tablet 1,000 mg     1,000 mg Oral Daily 06/06/17 1130     06/06/17 0945  clindamycin (CLEOCIN) 900 mg, gentamicin (GARAMYCIN) 240 mg in sodium chloride 0.9 % 1,000 mL for intraperitoneal lavage  Status:  Discontinued       As needed 06/06/17 0945 06/06/17 1008   06/06/17 0600  clindamycin (CLEOCIN) 900 mg, gentamicin (GARAMYCIN) 240 mg in sodium chloride 0.9 % 1,000 mL for intraperitoneal lavage  Status:  Discontinued      Intraperitoneal To Surgery 06/05/17 1324 06/06/17 1129   06/06/17 0522  cefoTEtan in Dextrose 5% (CEFOTAN) IVPB 2 g     2 g Intravenous On call to O.R. 06/06/17 0522 06/06/17 0800   06/06/17 0522  neomycin (MYCIFRADIN) tablet 1,000 mg  Status:  Discontinued     1,000 mg Oral 3 times per day 06/06/17 0522 06/06/17 1129   06/06/17 0522  metroNIDAZOLE (FLAGYL) tablet 1,000 mg  Status:  Discontinued     1,000 mg Oral 3 times per day 06/06/17 0522 06/06/17 1129      Assessment/Plan: s/p Procedure(s) with comments: XI ROBOTIC SIGMOID COLECTOMY (N/A) - ERAS PATHWAY RIGID PROCTOSCOPY (N/A) D/c foley Think MS changes yesterday due to  phenergan. Standing tylenol CXR and labs.   Advance diet.   LOS: 2 days    [facility] 06/08/2017

## 2017-06-09 ENCOUNTER — Encounter (HOSPITAL_COMMUNITY): Payer: Self-pay | Admitting: Surgery

## 2017-06-09 MED ORDER — HYDROCHLOROTHIAZIDE 25 MG PO TABS
25.0000 mg | ORAL_TABLET | Freq: Every day | ORAL | Status: DC
  Administered 2017-06-09: 25 mg via ORAL
  Filled 2017-06-09: qty 1

## 2017-06-09 MED ORDER — MAGNESIUM OXIDE 400 (241.3 MG) MG PO TABS
200.0000 mg | ORAL_TABLET | Freq: Every day | ORAL | Status: DC
  Administered 2017-06-09: 200 mg via ORAL
  Filled 2017-06-09: qty 1

## 2017-06-09 MED ORDER — ACETAMINOPHEN 500 MG PO TABS
1000.0000 mg | ORAL_TABLET | Freq: Three times a day (TID) | ORAL | Status: DC
  Administered 2017-06-09 – 2017-06-10 (×3): 1000 mg via ORAL
  Filled 2017-06-09 (×3): qty 2

## 2017-06-09 MED ORDER — GABAPENTIN 300 MG PO CAPS
300.0000 mg | ORAL_CAPSULE | Freq: Every day | ORAL | Status: DC
  Administered 2017-06-09: 300 mg via ORAL
  Filled 2017-06-09: qty 1

## 2017-06-09 MED ORDER — METHOCARBAMOL 500 MG PO TABS
1000.0000 mg | ORAL_TABLET | Freq: Four times a day (QID) | ORAL | Status: DC
  Administered 2017-06-09 (×2): 1000 mg via ORAL
  Filled 2017-06-09 (×3): qty 2

## 2017-06-09 NOTE — Progress Notes (Addendum)
Patient iv line got infiltrated this morning, patient did not want iv line restarted since she is not on any IV fluids and IV meds and she is going home tomorrow. Patient ambulating in hallway frequently and tolerated diet.

## 2017-06-09 NOTE — Progress Notes (Addendum)
[address] [place], [place] [address] Phone: [phone]  FAX: [place] [phone] [birth year]  CARE TEAM:  PCP: Eber Hong, MD  Outpatient Care Team: Patient Care Team: Eber Hong, MD as PCP - General (Internal Medicine) Michael Boston, MD as Consulting Physician (Colon and Rectal Surgery) Johnathan Hausen, MD as Consulting Physician (General Surgery) Pyrtle, Lajuan Lines, MD as Consulting Physician (Gastroenterology)  Inpatient Treatment Team: Treatment Team: Attending Provider: Michael Boston, MD; Technician: Abbe Amsterdam, NT; Technician: Sueanne Margarita, NT; Registered Nurse: Darrell Jewel, RN; Registered Nurse: Bailey Mech, RN; Registered Nurse: Charlesetta Ivory, RN   Problem List:   Principal Problem:   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/2017 Active Problems:   Acquired hypothyroidism   LACTOSE INTOLERANCE   Hypertension   GERD s/p redo Nissen fundoplication   Diverticulosis of colon   Osteoporosis   Barrett esophagus   Chronic pain syndrome   Chronic midline low back pain without sciatica   Chronic neck pain   Irritable bowel syndrome   Diverticulitis of sigmoid colon   3 Days Post-Op  06/06/2017  POST-OPERATIVE DIAGNOSIS:  Recurrent sigmoid diverticulitis  PROCEDURE:   XI ROBOTIC SIGMOID COLECTOMY RIGID PROCTOSCOPY  SURGEON:  [name], MD    Assessment  Improving  Plan:  -adv HH diet -improve pain control -switch pain medication -bowel regimen -f/u path -hypothyroidsim - synthroid -GERD - H2B s/p redo Nissen -HTN - restart diuretic -chronic pain control -VTE prophylaxis- SCDs, etc -mobilize as tolerated to help recovery  20 minutes spent in review, evaluation, examination, counseling, and coordination of care.  More than 50% of that time was spent in counseling.  [name], M.D., F.A.C.S. Gastrointestinal and  Minimally Invasive Surgery [facility] [address], [place], [place] [address] [phone] Main / Paging   06/09/2017    Subjective: (Chief complaint)  Not nauseated today.  No emesis.  Cannot sleep in bed last night due to chronic back pain issues.  Did not sleep well.  Trying to use ice and Tylenol only.  Worried about trying narcotics or muscle relaxants.  Appetite down but tolerating some solids.  Walking the hallways well.  Husband in room, fussing over her.  He is worried that she is hurting and will not take pain meds.  Objective:  Vital signs:  Vitals:   06/08/17 0525 06/08/17 1504 06/08/17 2203 06/09/17 0434  BP: (!) 150/81 131/82 133/71 (!) 143/78  Pulse: 83 86 74 85  Resp: _0 Temp: 98.9 F (37.2 C) 99 F (37.2 C) 98.9 F (37.2 C) 98.4 F (36.9 C)  TempSrc: Oral Oral Oral Oral  SpO2: 97% 98% 98% 97%  Weight:      Height:        Last BM Date:  (PTA)  Intake/Output   Yesterday:  08/19 0701 - 08/20 0700 In: 3363.3 [P.O.:1200; I.V.:2163.3] Out: 2600 [Urine:2600] This shift:  No intake/output data recorded.  Bowel function:  Flatus: YES  BM:  No  Drain: (No drain)   Physical Exam:  General: Pt awake/alert/oriented x4 in no acute distress.  Mildly uncomfortable and guarded at first but then seems to relax Eyes: PERRL, normal EOM.  Sclera clear.  No icterus Neuro: CN II-XII intact w/o focal sensory/motor deficits. Lymph: No head/neck/groin lymphadenopathy Psych:  No delerium/psychosis/paranoia HENT: Normocephalic, Mucus membranes moist.  No thrush Neck:  Supple, No tracheal deviation Chest: No chest wall pain w good excursion CV:  Pulses intact.  Regular rhythm MS: Normal AROM mjr joints.  No obvious deformity  Abdomen: Soft.  Nondistended.  Mildly tender at incisions only.  No evidence of peritonitis.  No incarcerated hernias.  Ext:  No deformity.  No mjr edema.  No cyanosis Skin: No  petechiae / purpura  Results:   Labs: Results for orders placed or performed during the hospital encounter of 06/06/17 (from the past 48 hour(s))  CBC with Differential/Platelet     Status: Abnormal   Collection Time: 06/07/17  8:43 AM  Result Value Ref Range   WBC 12.1 (H) 4.0 - 10.5 K/uL   RBC 3.38 (L) 3.87 - 5.11 MIL/uL   Hemoglobin 10.9 (L) 12.0 - 15.0 g/dL   HCT 30.6 (L) 36.0 - 46.0 %   MCV 90.5 78.0 - 100.0 fL   MCH 32.2 26.0 - 34.0 pg   MCHC 35.6 30.0 - 36.0 g/dL   RDW 13.1 11.5 - 15.5 %   Platelets 255 150 - 400 K/uL   Neutrophils Relative % 76 %   Neutro Abs 9.2 (H) 1.7 - 7.7 K/uL   Lymphocytes Relative 13 %   Lymphs Abs 1.6 0.7 - 4.0 K/uL   Monocytes Relative 11 %   Monocytes Absolute 1.3 (H) 0.1 - 1.0 K/uL   Eosinophils Relative 0 %   Eosinophils Absolute 0.0 0.0 - 0.7 K/uL   Basophils Relative 0 %   Basophils Absolute 0.0 0.0 - 0.1 K/uL  Basic metabolic panel     Status: Abnormal   Collection Time: 06/07/17  8:43 AM  Result Value Ref Range   Sodium 126 (L) 135 - 145 mmol/L   Potassium 3.8 3.5 - 5.1 mmol/L   Chloride 98 (L) 101 - 111 mmol/L   CO2 22 22 - 32 mmol/L   Glucose, Bld 109 (H) 65 - 99 mg/dL   BUN 9 6 - 20 mg/dL   Creatinine, Ser 0.52 0.44 - 1.00 mg/dL   Calcium 8.2 (L) 8.9 - 10.3 mg/dL   GFR calc non Af Amer >60 >60 mL/min   GFR calc Af Amer >60 >60 mL/min    Comment: (NOTE) The eGFR has been calculated using the CKD EPI equation. This calculation has not been validated in all clinical situations. eGFR's persistently <60 mL/min signify possible Chronic Kidney Disease.    Anion gap 6 5 - 15  Magnesium     Status: Abnormal   Collection Time: 06/07/17  8:43 AM  Result Value Ref Range   Magnesium 1.5 (L) 1.7 - 2.4 mg/dL    Imaging / Studies: Dg Chest Port 1 View  Result Date: 06/07/2017 CLINICAL DATA:  Nausea and vomiting.  Shortness of breath. EXAM: PORTABLE CHEST 1 VIEW COMPARISON:  July 21, 2014 FINDINGS: The study is limited due to the  low volume portable technique. No pneumothorax. The heart is borderline. The hila are symmetric. The mediastinum is mildly prominent, probably technical in nature. Increased interstitial markings are identified in the lungs. No focal infiltrate. IMPRESSION: 1. Mild increased interstitial prominence could represent vascular crowding versus pulmonary venous congestion. No overt edema. No focal infiltrate. 2. Mild prominence of the mediastinum is likely technical in nature. Recommend a PA and lateral chest x-ray before discharge. Electronically Signed   By: Dorise Bullion III M.D   On: 06/07/2017 09:06    Medications / Allergies: per chart  Antibiotics: Anti-infectives    Start  Dose/Rate Route Frequency Ordered Stop   06/06/17 1230  valACYclovir (VALTREX) tablet 1,000 mg     1,000 mg Oral Daily 06/06/17 1130     06/06/17 0945  clindamycin (CLEOCIN) 900 mg, gentamicin (GARAMYCIN) 240 mg in sodium chloride 0.9 % 1,000 mL for intraperitoneal lavage  Status:  Discontinued       As needed 06/06/17 0945 06/06/17 1008   06/06/17 0600  clindamycin (CLEOCIN) 900 mg, gentamicin (GARAMYCIN) 240 mg in sodium chloride 0.9 % 1,000 mL for intraperitoneal lavage  Status:  Discontinued      Intraperitoneal To Surgery 06/05/17 1324 06/06/17 1129   06/06/17 0522  cefoTEtan in Dextrose 5% (CEFOTAN) IVPB 2 g     2 g Intravenous On call to O.R. 06/06/17 0522 06/06/17 0800   06/06/17 0522  neomycin (MYCIFRADIN) tablet 1,000 mg  Status:  Discontinued     1,000 mg Oral 3 times per day 06/06/17 0522 06/06/17 1129   06/06/17 0522  metroNIDAZOLE (FLAGYL) tablet 1,000 mg  Status:  Discontinued     1,000 mg Oral 3 times per day 06/06/17 0522 06/06/17 1129        Note: Portions of this report may have been transcribed using voice recognition software. Every effort was made to ensure accuracy; however, inadvertent computerized transcription errors may be present.   Any transcriptional errors that result from this  process are unintentional.     [name], M.D., F.A.C.S. Gastrointestinal and Minimally Invasive Surgery [facility] [address], [place], [place] [address] [phone] Main / Paging   06/09/2017

## 2017-06-10 NOTE — Progress Notes (Signed)
Pt s/p robotic sigmoid colectomy for recurrent diverticulitis Path benign. I told the pt the good news  Copy of report given to patient

## 2017-06-10 NOTE — Progress Notes (Signed)
All criteria met. No iv present . Desires discharge . Am assessment unchanged . Discharge instructions discussed until no further questions ask. Able to voice when to call MD and follow up appointment timing. Discharged via wheelchair with husband

## 2017-06-10 NOTE — Discharge Summary (Signed)
Physician Discharge Summary  Patient ID: [name] MRN: [id number] DOB/AGE: 1951/09/24  66 y.o.  Admit date: 06/06/2017 Discharge date: 06/10/2017   Patient Care Team: Eber Hong, MD as PCP - General (Internal Medicine) Michael Boston, MD as Consulting Physician (Colon and Rectal Surgery) Johnathan Hausen, MD as Consulting Physician (General Surgery) Pyrtle, Lajuan Lines, MD as Consulting Physician (Gastroenterology)  Discharge Diagnoses:  Principal Problem:   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/2017 Active Problems:   Acquired hypothyroidism   LACTOSE INTOLERANCE   Hypertension   GERD s/p redo Nissen fundoplication   Diverticulosis of colon   Osteoporosis   Barrett esophagus   Chronic pain syndrome   Chronic midline low back pain without sciatica   Chronic neck pain   Irritable bowel syndrome   Diverticulitis of sigmoid colon   POST-OPERATIVE DIAGNOSIS:   recurrent sigmoid diverticulitis  SURGERY:  06/06/2017  Procedure(s): XI ROBOTIC SIGMOID COLECTOMY RIGID PROCTOSCOPY  SURGEON:    Surgeon(s): Michael Boston, MD Leighton Ruff, MD  Consults: None  Hospital Course:   The patient underwent the surgery above.  Postoperatively, the patient gradually mobilized and advanced to a solid diet.  Pain and other symptoms were treated aggressively.    By the time of discharge, the patient was walking well the hallways, eating food, having flatus.  Pain was well-controlled on an oral medications.  Based on meeting discharge criteria and continuing to recover, I felt it was safe for the patient to be discharged from the hospital to further recover with close followup. Postoperative recommendations were discussed in detail.  They are written as well.  Discharged Condition: good  Disposition:  Follow-up Information    Michael Boston, MD. Schedule an appointment as soon as possible for a visit in 2 week(s).   Specialty:  General Surgery Why:  To follow up  after your operation, To follow up after your hospital stay Contact information: [address] [place] [address] [phone]           01-Home or Self Care  Discharge Instructions    Call MD for:    Complete by:  As directed    FEVER > 101.5 F  (temperatures < 101.5 F are not significant)   Call MD for:    Complete by:  As directed    FEVER > 101.5 F  (temperatures < 101.5 F are not significant)   Call MD for:  extreme fatigue    Complete by:  As directed    Call MD for:  extreme fatigue    Complete by:  As directed    Call MD for:  persistant dizziness or light-headedness    Complete by:  As directed    Call MD for:  persistant dizziness or light-headedness    Complete by:  As directed    Call MD for:  persistant nausea and vomiting    Complete by:  As directed    Call MD for:  persistant nausea and vomiting    Complete by:  As directed    Call MD for:  redness, tenderness, or signs of infection (pain, swelling, redness, odor or green/yellow discharge around incision site)    Complete by:  As directed    Call MD for:  redness, tenderness, or signs of infection (pain, swelling, redness, odor or green/yellow discharge around incision site)    Complete by:  As directed    Call MD for:  severe uncontrolled pain    Complete by:  As  directed    Call MD for:  severe uncontrolled pain    Complete by:  As directed    Diet - low sodium heart healthy    Complete by:  As directed    Follow a light diet the first few days at home.   Start with a bland diet such as soups, liquids, starchy foods, low fat foods, etc.   If you feel full, bloated, or constipated, stay on a full liquid or pureed/blenderized diet for a few days until you feel better and no longer constipated. Be sure to drink plenty of fluids every day to avoid getting dehydrated (feeling dizzy, not urinating, etc.). Gradually add a fiber supplement to your diet   Diet - low sodium heart healthy     Complete by:  As directed    Follow a light diet the first few days at home.   Start with a bland diet such as soups, liquids, starchy foods, low fat foods, etc.   If you feel full, bloated, or constipated, stay on a full liquid or pureed/blenderized diet for a few days until you feel better and no longer constipated. Be sure to drink plenty of fluids every day to avoid getting dehydrated (feeling dizzy, not urinating, etc.). Gradually add a fiber supplement to your diet   Discharge instructions    Complete by:  As directed    See Discharge Instructions If you are not getting better after two weeks or are noticing you are getting worse, contact our office [phone] for further advice.  We may need to adjust your medications, re-evaluate you in the office, send you to the emergency room, or see what other things we can do to help. The clinic staff is available to answer your questions during regular business hours (8:30am-5pm).  Please don't hesitate to call and ask to speak to one of our nurses for clinical concerns.    A surgeon from [facility] is always on call at the hospitals 24 hours/day If you have a medical emergency, go to the nearest emergency room or call 911.   Discharge instructions    Complete by:  As directed    See Discharge Instructions If you are not getting better after two weeks or are noticing you are getting worse, contact our office [phone] for further advice.  We may need to adjust your medications, re-evaluate you in the office, send you to the emergency room, or see what other things we can do to help. The clinic staff is available to answer your questions during regular business hours (8:30am-5pm).  Please don't hesitate to call and ask to speak to one of our nurses for clinical concerns.    A surgeon from [facility] is always on call at the hospitals 24 hours/day If you have a medical emergency, go to the nearest emergency room or  call 911.   Driving Restrictions    Complete by:  As directed    You may drive when you are no longer taking narcotic prescription pain medication, you can comfortably wear a seatbelt, and you can safely make sudden turns/stops to protect yourself without hesitating due to pain.   Driving Restrictions    Complete by:  As directed    You may drive when you are no longer taking narcotic prescription pain medication, you can comfortably wear a seatbelt, and you can safely make sudden turns/stops to protect yourself without hesitating due to pain.   Increase activity slowly  Complete by:  As directed    Start light daily activities --- self-care, walking, climbing stairs- beginning the day after surgery.  Gradually increase activities as tolerated.  Control your pain to be active.  Stop when you are tired.  Ideally, walk several times a day, eventually an hour a day.   Most people are back to most day-to-day activities in a few weeks.  It takes 4-8 weeks to get back to unrestricted, intense activity. If you can walk 30 minutes without difficulty, it is safe to try more intense activity such as jogging, treadmill, bicycling, low-impact aerobics, swimming, etc. Save the most intensive and strenuous activity for last (Usually 4-8 weeks after surgery) such as sit-ups, heavy lifting, contact sports, etc.  Refrain from any intense heavy lifting or straining until you are off narcotics for pain control.  You will have off days, but things should improve week-by-week. DO NOT PUSH THROUGH PAIN.  Let pain be your guide: If it hurts to do something, don't do it.  Pain is your body warning you to avoid that activity for another week until the pain goes down.   Increase activity slowly    Complete by:  As directed    Start light daily activities --- self-care, walking, climbing stairs- beginning the day after surgery.  Gradually increase activities as tolerated.  Control your pain to be active.  Stop when you are  tired.  Ideally, walk several times a day, eventually an hour a day.   Most people are back to most day-to-day activities in a few weeks.  It takes 4-8 weeks to get back to unrestricted, intense activity. If you can walk 30 minutes without difficulty, it is safe to try more intense activity such as jogging, treadmill, bicycling, low-impact aerobics, swimming, etc. Save the most intensive and strenuous activity for last (Usually 4-8 weeks after surgery) such as sit-ups, heavy lifting, contact sports, etc.  Refrain from any intense heavy lifting or straining until you are off narcotics for pain control.  You will have off days, but things should improve week-by-week. DO NOT PUSH THROUGH PAIN.  Let pain be your guide: If it hurts to do something, don't do it.  Pain is your body warning you to avoid that activity for another week until the pain goes down.   Lifting restrictions    Complete by:  As directed    If you can walk 30 minutes without difficulty, it is safe to try more intense activity such as jogging, treadmill, bicycling, low-impact aerobics, swimming, etc. Save the most intensive and strenuous activity for last (Usually 4-8 weeks after surgery) such as sit-ups, heavy lifting, contact sports, etc.  Refrain from any intense heavy lifting or straining until you are off narcotics for pain control.  You will have off days, but things should improve week-by-week. DO NOT PUSH THROUGH PAIN.  Let pain be your guide: If it hurts to do something, don't do it.  Pain is your body warning you to avoid that activity for another week until the pain goes down.   Lifting restrictions    Complete by:  As directed    If you can walk 30 minutes without difficulty, it is safe to try more intense activity such as jogging, treadmill, bicycling, low-impact aerobics, swimming, etc. Save the most intensive and strenuous activity for last (Usually 4-8 weeks after surgery) such as sit-ups, heavy lifting, contact sports,  etc.  Refrain from any intense heavy lifting or straining until you are off  narcotics for pain control.  You will have off days, but things should improve week-by-week. DO NOT PUSH THROUGH PAIN.  Let pain be your guide: If it hurts to do something, don't do it.  Pain is your body warning you to avoid that activity for another week until the pain goes down.   May walk up steps    Complete by:  As directed    May walk up steps    Complete by:  As directed    No wound care    Complete by:  As directed    It is good for closed incision and even open wounds to be washed every day.  Shower every day.  Short baths are fine.  Wash the incisions and wounds clean with soap & water.    If you have a closed incision(s), wash the incision with soap & water every day.  You may leave closed incisions open to air if it is dry.   You may cover the incision with clean gauze & replace it after your daily shower for comfort. If you have skin tapes (Steristrips) or skin glue (Dermabond) on your incision, leave them in place.  They will fall off on their own like a scab.  You may trim any edges that curl up with clean scissors.  If you have staples, set up an appointment for them to be removed in the office in 10 days after surgery.  If you have a drain, wash around the skin exit site with soap & water and place a new dressing of gauze or band aid around the skin every day.  Keep the drain site clean & dry.   No wound care    Complete by:  As directed    It is good for closed incision and even open wounds to be washed every day.  Shower every day.  Short baths are fine.  Wash the incisions and wounds clean with soap & water.   Keep the incisions clean & dry.   Sexual Activity Restrictions    Complete by:  As directed    You may have sexual intercourse when it is comfortable. If it hurts to do something, stop.   Sexual Activity Restrictions    Complete by:  As directed    You may have sexual intercourse when it is  comfortable. If it hurts to do something, stop.      Allergies as of 06/10/2017      Reactions   Alendronate Sodium Other (See Comments)   Irritates GERD   Codeine Nausea And Vomiting   Ciprofloxacin Hives, Rash   Tetracycline Hives, Rash      Medication List    TAKE these medications   acetaminophen 325 MG tablet Commonly known as:  TYLENOL Take 325 mg by mouth at bedtime.   ALIGN 4 MG Caps Take 1 capsule by mouth daily.   aspirin 81 MG tablet Take 81 mg by mouth daily.   CALCIUM 600 + D PO Take 1 tablet by mouth daily at 12 noon.   diclofenac sodium 1 % Gel Commonly known as:  VOLTAREN Apply 2 g topically 3 (three) times daily as needed (pain).   dicyclomine 20 MG tablet Commonly known as:  BENTYL Take 1 tablet (20 mg total) by mouth 3 (three) times daily as needed for spasms.   EQL OMEGA 3 FISH OIL 1400 MG Caps Take 1,400 mg by mouth daily.   EVISTA 60 MG tablet Generic drug:  raloxifene Take  60 mg by mouth daily.   famotidine 20 MG tablet Commonly known as:  PEPCID Take 20 mg by mouth 2 (two) times daily.   hydrochlorothiazide 25 MG tablet Commonly known as:  HYDRODIURIL Take 25 mg by mouth daily.   levothyroxine 88 MCG tablet Commonly known as:  SYNTHROID, LEVOTHROID Take 88 mcg by mouth daily before breakfast.   Magnesium 250 MG Tabs Take 250 mg by mouth daily at 12 noon.   multivitamin tablet Take 1 tablet by mouth daily at 12 noon.   traMADol 50 MG tablet Commonly known as:  ULTRAM Take 1 tablet (50 mg total) by mouth every 6 (six) hours as needed for moderate pain. What changed:  how much to take  reasons to take this   traMADol 50 MG tablet Commonly known as:  ULTRAM Take 1-2 tablets (50-100 mg total) by mouth every 6 (six) hours as needed for moderate pain or severe pain. What changed:  You were already taking a medication with the same name, and this prescription was added. Make sure you understand how and when to take each.    valACYclovir 1000 MG tablet Commonly known as:  VALTREX Take 1,000 mg by mouth daily.       Significant Diagnostic Studies:  Results for orders placed or performed during the hospital encounter of 06/06/17 (from the past 72 hour(s))  CBC with Differential/Platelet     Status: Abnormal   Collection Time: 06/07/17  8:43 AM  Result Value Ref Range   WBC 12.1 (H) 4.0 - 10.5 K/uL   RBC 3.38 (L) 3.87 - 5.11 MIL/uL   Hemoglobin 10.9 (L) 12.0 - 15.0 g/dL   HCT 30.6 (L) 36.0 - 46.0 %   MCV 90.5 78.0 - 100.0 fL   MCH 32.2 26.0 - 34.0 pg   MCHC 35.6 30.0 - 36.0 g/dL   RDW 13.1 11.5 - 15.5 %   Platelets 255 150 - 400 K/uL   Neutrophils Relative % 76 %   Neutro Abs 9.2 (H) 1.7 - 7.7 K/uL   Lymphocytes Relative 13 %   Lymphs Abs 1.6 0.7 - 4.0 K/uL   Monocytes Relative 11 %   Monocytes Absolute 1.3 (H) 0.1 - 1.0 K/uL   Eosinophils Relative 0 %   Eosinophils Absolute 0.0 0.0 - 0.7 K/uL   Basophils Relative 0 %   Basophils Absolute 0.0 0.0 - 0.1 K/uL  Basic metabolic panel     Status: Abnormal   Collection Time: 06/07/17  8:43 AM  Result Value Ref Range   Sodium 126 (L) 135 - 145 mmol/L   Potassium 3.8 3.5 - 5.1 mmol/L   Chloride 98 (L) 101 - 111 mmol/L   CO2 22 22 - 32 mmol/L   Glucose, Bld 109 (H) 65 - 99 mg/dL   BUN 9 6 - 20 mg/dL   Creatinine, Ser 0.52 0.44 - 1.00 mg/dL   Calcium 8.2 (L) 8.9 - 10.3 mg/dL   GFR calc non Af Amer >60 >60 mL/min   GFR calc Af Amer >60 >60 mL/min    Comment: (NOTE) The eGFR has been calculated using the CKD EPI equation. This calculation has not been validated in all clinical situations. eGFR's persistently <60 mL/min signify possible Chronic Kidney Disease.    Anion gap 6 5 - 15  Magnesium     Status: Abnormal   Collection Time: 06/07/17  8:43 AM  Result Value Ref Range   Magnesium 1.5 (L) 1.7 - 2.4 mg/dL    Dg  Chest Port 1 View  Result Date: 06/07/2017 CLINICAL DATA:  Nausea and vomiting.  Shortness of breath. EXAM: PORTABLE CHEST 1 VIEW  COMPARISON:  July 21, 2014 FINDINGS: The study is limited due to the low volume portable technique. No pneumothorax. The heart is borderline. The hila are symmetric. The mediastinum is mildly prominent, probably technical in nature. Increased interstitial markings are identified in the lungs. No focal infiltrate. IMPRESSION: 1. Mild increased interstitial prominence could represent vascular crowding versus pulmonary venous congestion. No overt edema. No focal infiltrate. 2. Mild prominence of the mediastinum is likely technical in nature. Recommend a PA and lateral chest x-ray before discharge. Electronically Signed   By: Dorise Bullion III M.D   On: 06/07/2017 09:06    Discharge Exam: Blood pressure 134/65, pulse 65, temperature 98.4 F (36.9 C), temperature source Oral, resp. rate 16, height 5' 4" (1.626 m), weight 68 kg (150 lb), SpO2 96 %.  General: Pt awake/alert/oriented x4 in No acute distress.  Smiling.  Walking easily & normally in room. Eyes: PERRL, normal EOM.  Sclera clear.  No icterus Neuro: CN II-XII intact w/o focal sensory/motor deficits. Lymph: No head/neck/groin lymphadenopathy Psych:  No delerium/psychosis/paranoia.  Well groomed HENT: Normocephalic, Mucus membranes moist.  No thrush Neck: Supple, No tracheal deviation Chest: No chest wall pain w good excursion CV:  Pulses intact.  Regular rhythm MS: Normal AROM mjr joints.  No obvious deformity Abdomen: Soft.  Nondistended.  Nontender.  No evidence of peritonitis.  No incarcerated hernias. Ext:  SCDs BLE.  No mjr edema.  No cyanosis Skin: No petechiae / purpura  Past Medical History:  Diagnosis Date  . Anemia   . Arthritis   . Barrett's esophagus   . Cataract   . Diverticulosis of colon (without mention of hemorrhage)   . Duodenitis 04/12/2011  . GASTRITIS 10/05/2008   Qualifier: Diagnosis of  By: Heloise Ochoa, Manuela Schwartz    . GERD (gastroesophageal reflux disease)   . Hiatal hernia   . Hypertension   .  Hypothyroidism   . IBS (irritable bowel syndrome)   . Irritable bowel syndrome with diarrhea   . S/P redo revision of Lap Nissen Oct 2015 04/12/2011  . Skin cancer   . Thyroid disease     Past Surgical History:  Procedure Laterality Date  . ABDOMINAL HYSTERECTOMY    . APPENDECTOMY    . COLONOSCOPY  2009   562.10  . EYE SURGERY     eyelid  . FLEXOR TENDON REPAIR Right 11/23/2015   Procedure: RECONSTUCTION FLEXOR DIGITORUM PROFUNDUS RIGHT SMALL FINGER  POLLICIS LONGUS GRAFT ;  Surgeon: Daryll Brod, MD;  Location: [facility];  Service: Orthopedics;  Laterality: Right;  . LAPAROSCOPIC NISSEN FUNDOPLICATION N/A 06/29/8337   Procedure: REDO NISSEN FUNDOPLICATION WITH UPPER ENDOSCOPY;  Surgeon: Kaylyn Lim, MD;  Location: [facility];  Service: General;  Laterality: N/A;  . NISSEN FUNDOPLICATION  2505  . PROCTOSCOPY N/A 06/06/2017   Procedure: RIGID PROCTOSCOPY;  Surgeon: Michael Boston, MD;  Location: [facility];  Service: General;  Laterality: N/A;  . skin cancer removal from right thigh     . TMJ ARTHROPLASTY    . TOTAL VAGINAL HYSTERECTOMY    . TRIGGER FINGER RELEASE      Social History   Social History  . Marital status: Married    Spouse name: N/A  . Number of children: N/A  . Years of education: N/A   Occupational History  . clerical    Social  History Main Topics  . Smoking status: Never Smoker  . Smokeless tobacco: Never Used  . Alcohol use Yes     Comment: social  . Drug use: No  . Sexual activity: Not on file   Other Topics Concern  . Not on file   Social History Narrative  . No narrative on file    Family History  Problem Relation Age of Onset  . Throat cancer Father 81       and mouth  . Hypertension Father   . Heart disease Mother   . Diabetes Mother   . Hypertension Mother   . Colon cancer Neg Hx     Current Facility-Administered Medications  Medication Dose Route Frequency Provider Last Rate Last Dose  . acetaminophen (TYLENOL) tablet 1,000  mg  1,000 mg Oral Tor Netters, MD   1,000 mg at 06/10/17 0654  . acidophilus (RISAQUAD) capsule 1 capsule  1 capsule Oral Daily Michael Boston, MD   1 capsule at 06/09/17 0928  . alum & mag hydroxide-simeth (MAALOX/MYLANTA) 200-200-20 MG/5ML suspension 30 mL  30 mL Oral Q6H PRN Michael Boston, MD      . aspirin chewable tablet 81 mg  81 mg Oral Daily Michael Boston, MD   81 mg at 06/09/17 0929  . calcium-vitamin D (OSCAL WITH D) 500-200 MG-UNIT per tablet 1 tablet  1 tablet Oral Daily Michael Boston, MD   1 tablet at 06/09/17 1252  . diclofenac sodium (VOLTAREN) 1 % transdermal gel 2 g  2 g Topical TID PRN Michael Boston, MD   2 g at 06/07/17 1950  . dicyclomine (BENTYL) tablet 20 mg  20 mg Oral TID PRN Michael Boston, MD      . enoxaparin (LOVENOX) injection 40 mg  40 mg Subcutaneous Q24H Michael Boston, MD   40 mg at 06/09/17 0746  . famotidine (PEPCID) tablet 20 mg  20 mg Oral BID Michael Boston, MD   20 mg at 06/09/17 2200  . gabapentin (NEURONTIN) capsule 300 mg  300 mg Oral Ardeen Fillers, MD   300 mg at 06/09/17 2200  . guaiFENesin-dextromethorphan (ROBITUSSIN DM) 100-10 MG/5ML syrup 10 mL  10 mL Oral Q4H PRN Michael Boston, MD      . hydrochlorothiazide (HYDRODIURIL) tablet 25 mg  25 mg Oral Daily Michael Boston, MD   25 mg at 06/09/17 2831  . hydrocortisone (ANUSOL-HC) 2.5 % rectal cream 1 application  1 application Topical QID PRN Michael Boston, MD      . hydrocortisone cream 1 % 1 application  1 application Topical TID PRN Michael Boston, MD      . levothyroxine (SYNTHROID, LEVOTHROID) tablet 88 mcg  88 mcg Oral QAC breakfast Michael Boston, MD   88 mcg at 06/09/17 0746  . lip balm (CARMEX) ointment 1 application  1 application Topical BID Michael Boston, MD   1 application at 51/76/16 2201  . magic mouthwash  15 mL Oral QID PRN Michael Boston, MD      . magnesium oxide (MAG-OX) tablet 200 mg  200 mg Oral Q1200 Michael Boston, MD   200 mg at 06/09/17 1252  . menthol-cetylpyridinium (CEPACOL)  lozenge 3 mg  1 lozenge Oral PRN Michael Boston, MD      . methocarbamol (ROBAXIN) tablet 1,000 mg  1,000 mg Oral QID Michael Boston, MD   1,000 mg at 06/09/17 2200  . multivitamin with minerals tablet 1 tablet  1 tablet Oral Q1200 Michael Boston, MD   1 tablet at  06/09/17 1251  . omega-3 acid ethyl esters (LOVAZA) capsule 1 g  1 g Oral Daily Michael Boston, MD   1 g at 06/09/17 6222  . ondansetron (ZOFRAN) injection 4 mg  4 mg Intravenous Q6H PRN Stark Klein, MD   4 mg at 06/06/17 1855  . phenol (CHLORASEPTIC) mouth spray 1-2 spray  1-2 spray Mouth/Throat PRN Michael Boston, MD      . psyllium (HYDROCIL/METAMUCIL) packet 1 packet  1 packet Oral Daily Michael Boston, MD   1 packet at 06/09/17 0930  . raloxifene (EVISTA) tablet 60 mg  60 mg Oral Daily Michael Boston, MD   60 mg at 06/09/17 0929  . traMADol (ULTRAM) tablet 50-100 mg  50-100 mg Oral Q6H PRN Michael Boston, MD   50 mg at 06/09/17 1624  . valACYclovir (VALTREX) tablet 1,000 mg  1,000 mg Oral Daily Michael Boston, MD   1,000 mg at 06/09/17 0930     Allergies  Allergen Reactions  . Alendronate Sodium Other (See Comments)    Irritates GERD  . Codeine Nausea And Vomiting  . Ciprofloxacin Hives and Rash  . Tetracycline Hives and Rash    Signed: Morton Peters, M.D., F.A.C.S. Gastrointestinal and Minimally Invasive Surgery [facility] [address], [place], [place] [address] [phone] Main / Paging   06/10/2017, 7:29 AM

## 2017-07-31 ENCOUNTER — Ambulatory Visit (INDEPENDENT_AMBULATORY_CARE_PROVIDER_SITE_OTHER): Payer: BLUE CROSS/BLUE SHIELD | Admitting: Family Medicine

## 2017-07-31 ENCOUNTER — Encounter: Payer: Self-pay | Admitting: Family Medicine

## 2017-07-31 VITALS — BP 120/66 | HR 93 | Temp 99.2°F | Resp 16 | Ht 64.0 in | Wt 140.4 lb

## 2017-07-31 DIAGNOSIS — Z024 Encounter for examination for driving license: Secondary | ICD-10-CM

## 2017-07-31 NOTE — Progress Notes (Signed)
Subjective:  By signing my name below, I, [name], attest that this documentation has been prepared under the direction and in the presence of Charlotte Ray, MD. Electronically Signed: Moises Park, [place]. 07/31/2017 , 9:07 AM .  Patient was seen in Room 10 .   Patient ID: [name], female    DOB: 15-May-1951, 66 y.o.   MRN: [id number] Chief Complaint  Patient presents with  . DOT physical   HPI [name] is a 66 y.o. female Here for DOT physical. She has a history of IBS, HTN, GERD, hypothyroidism, and chronic pain syndrome with chronic low back pain and neck pain.   Chronic neck and back pain  Office visit note reviewed from Sept 29th, chronic neck pain with DDD. She was having radiating pain down her right arm, but normal muscle tone, and normal reflexes. She was treated with prednisone, Toradol and Decadron injection. She takes 1 tablet of tramadol at bed time as needed. She denies feeling sedated during the day or when she wakes up in the morning. She sees chiropractor regularly. She denies any extremity weakness.   HTN She takes HCTZ 25mg  QD. She denies any history of heart disease. She denies sleep apnea.   Skin cancer She has history of skin cancer 7 years ago, located on her thigh.   Recurrent diverticulitis She is s/p sigmoid colectomy on Aug 17th, 2018. She was cleared to work without any complications.   Patient Active Problem List   Diagnosis Date Noted  . 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/2017 06/06/2017  . Diverticulitis of sigmoid colon 06/06/2017  . Chronic neck pain 05/27/2017  . Chronic pain syndrome 12/19/2016  . Chronic midline low back pain without sciatica 05/16/2016  . Barrett esophagus 05/14/2011  . Acquired hypothyroidism 07/17/2010  . LACTOSE INTOLERANCE 10/04/2008  . Hypertension 10/04/2008  . GERD s/p redo Nissen fundoplication 65/78/4696  . Osteoporosis 10/04/2008  . Diverticulosis of colon  10/03/2008  . Irritable bowel syndrome 01/09/2004   Past Medical History:  Diagnosis Date  . Anemia   . Arthritis   . Barrett's esophagus   . Cataract   . Diverticulosis of colon (without mention of hemorrhage)   . Duodenitis 04/12/2011  . GASTRITIS 10/05/2008   Qualifier: Diagnosis of  By: Heloise Ochoa, Manuela Schwartz    . GERD (gastroesophageal reflux disease)   . Hiatal hernia   . Hypertension   . Hypothyroidism   . IBS (irritable bowel syndrome)   . Irritable bowel syndrome with diarrhea   . S/P redo revision of Lap Nissen Oct 2015 04/12/2011  . Skin cancer   . Thyroid disease    Past Surgical History:  Procedure Laterality Date  . ABDOMINAL HYSTERECTOMY    . APPENDECTOMY    . COLONOSCOPY  2009   562.10  . EYE SURGERY     eyelid  . FLEXOR TENDON REPAIR Right 11/23/2015   Procedure: RECONSTUCTION FLEXOR DIGITORUM PROFUNDUS RIGHT SMALL FINGER  POLLICIS LONGUS GRAFT ;  Surgeon: Daryll Brod, MD;  Location: [facility];  Service: Orthopedics;  Laterality: Right;  . LAPAROSCOPIC NISSEN FUNDOPLICATION N/A 29/02/2840   Procedure: REDO NISSEN FUNDOPLICATION WITH UPPER ENDOSCOPY;  Surgeon: Kaylyn Lim, MD;  Location: [facility];  Service: General;  Laterality: N/A;  . NISSEN FUNDOPLICATION  3244  . PROCTOSCOPY N/A 06/06/2017   Procedure: RIGID PROCTOSCOPY;  Surgeon: Michael Boston, MD;  Location: [facility];  Service: General;  Laterality: N/A;  . skin cancer removal from right  thigh     . TMJ ARTHROPLASTY    . TOTAL VAGINAL HYSTERECTOMY    . TRIGGER FINGER RELEASE     Allergies  Allergen Reactions  . Alendronate Sodium Other (See Comments)    Irritates GERD  . Codeine Nausea And Vomiting  . Ciprofloxacin Hives and Rash  . Tetracycline Hives and Rash   Prior to Admission medications   Medication Sig Start Date End Date Taking? Authorizing Provider  acetaminophen (TYLENOL) 325 MG tablet Take 325 mg by mouth at bedtime.     [provider]  aspirin 81 MG tablet Take 81 mg  by mouth daily.     [provider]  Calcium Carbonate-Vitamin D (CALCIUM 600 + D PO) Take 1 tablet by mouth daily at 12 noon.     [provider]  diclofenac sodium (VOLTAREN) 1 % GEL Apply 2 g topically 3 (three) times daily as needed (pain).    [provider]  dicyclomine (BENTYL) 20 MG tablet Take 1 tablet (20 mg total) by mouth 3 (three) times daily as needed for spasms. 02/14/17   Pyrtle, Lajuan Lines, MD  famotidine (PEPCID) 20 MG tablet Take 20 mg by mouth 2 (two) times daily.     [provider]  hydrochlorothiazide (HYDRODIURIL) 25 MG tablet Take 25 mg by mouth daily.     [provider]  levothyroxine (SYNTHROID, LEVOTHROID) 88 MCG tablet Take 88 mcg by mouth daily before breakfast.     [provider]  Magnesium 250 MG TABS Take 250 mg by mouth daily at 12 noon.     [provider]  Multiple Vitamin (MULTIVITAMIN) tablet Take 1 tablet by mouth daily at 12 noon.     [provider]  Omega-3 Fatty Acids (EQL OMEGA 3 FISH OIL) 1400 MG CAPS Take 1,400 mg by mouth daily.     [provider]  Probiotic Product (ALIGN) 4 MG CAPS Take 1 capsule by mouth daily.     [provider]  raloxifene (EVISTA) 60 MG tablet Take 60 mg by mouth daily.     [provider]  traMADol (ULTRAM) 50 MG tablet Take 1 tablet (50 mg total) by mouth every 6 (six) hours as needed for moderate pain. Patient taking differently: Take 25-50 mg by mouth every 6 (six) hours as needed for moderate pain (0.5-1 tablet depending on pain level. Takes every night at bedtime).  07/31/14   Michael Boston, MD  traMADol (ULTRAM) 50 MG tablet Take 1-2 tablets (50-100 mg total) by mouth every 6 (six) hours as needed for moderate pain or severe pain. 06/06/17   Michael Boston, MD  valACYclovir (VALTREX) 1000 MG tablet Take 1,000 mg by mouth daily.  02/11/11   [provider]   Social History   Social History  . Marital status: Married     Spouse name: N/A  . Number of children: N/A  . Years of education: N/A   Occupational History  . clerical    Social History Main Topics  . Smoking status: Never Smoker  . Smokeless tobacco: Never Used  . Alcohol use Yes     Comment: social  . Drug use: No  . Sexual activity: Not on file   Other Topics Concern  . Not on file   Social History Narrative  . No narrative on file   Review of Systems  Constitutional: Negative for fatigue and unexpected weight change.  Respiratory: Negative for chest tightness and shortness of breath.  Cardiovascular: Negative for chest pain, palpitations and leg swelling.  Gastrointestinal: Negative for abdominal pain and Park in stool.  Neurological: Negative for dizziness, syncope, weakness, light-headedness and headaches.  Psychiatric/Behavioral: Negative for sleep disturbance.       Objective:   Physical Exam  Constitutional: She is oriented to person, place, and time. She appears well-developed and well-nourished.  HENT:  Head: Normocephalic and atraumatic.  Right Ear: External ear normal.  Left Ear: External ear normal.  Mouth/Throat: Oropharynx is clear and moist.  Eyes: Pupils are equal, round, and reactive to light. Conjunctivae are normal.  Neck: Normal range of motion. Neck supple. No thyromegaly present.  Cardiovascular: Normal rate, regular rhythm, normal heart sounds and intact distal pulses.   No murmur heard. Pulmonary/Chest: Effort normal and breath sounds normal. No respiratory distress. She has no wheezes.  Abdominal: Soft. Bowel sounds are normal. There is no tenderness.  No visible abdominal wall hernias  Musculoskeletal: Normal range of motion. She exhibits no edema or tenderness.  Full strength upper extremities; L-spine: full ROM  Lymphadenopathy:    She has no cervical adenopathy.  Neurological: She is alert and oriented to person, place, and time.  Skin: Skin is warm and dry. No rash noted.  Psychiatric: She  has a normal mood and affect. Her behavior is normal. Thought content normal.  Vitals reviewed.   Vitals:   07/31/17 0828  BP: 120/66  Resp: 16  Temp: 99.2 F (37.3 C)  TempSrc: Oral  SpO2: 99%  Weight: 140 lb 6.4 oz (63.7 kg)  Height: 5\' 4"  (1.626 m)      Visual Acuity Screening   Right eye Left eye Both eyes  Without correction: 20/25 20/20 20/20   With correction:          Assessment & Plan:  SUNDAY KLOS is a 66 y.o. female Encounter for commercial driver medical examination (CDME) Hypertension, controlled, recent surgery but cleared to drive without restrictions. Chronic neck and back pain, but only requires tramadol at night without daytime or morning somnolence. Has been on same regimen for years, no weakness and reassuring exam  - One year card provided, see DOT paperwork  No orders of the defined types were placed in this encounter.  Patient Instructions  Thanks for coming in today. One year card provided with history of hypertension.  I personally performed the services described in this documentation, which was scribed in my presence. The recorded information has been reviewed and considered for accuracy and completeness, addended by me as needed, and agree with information above.  Signed,   Charlotte Ray, MD [facility].  07/31/17 9:23 AM

## 2017-07-31 NOTE — Patient Instructions (Signed)
Thanks for coming in today. One year card provided with history of hypertension.

## 2017-10-24 DIAGNOSIS — K2100 Gastro-esophageal reflux disease with esophagitis, without bleeding: Secondary | ICD-10-CM | POA: Insufficient documentation

## 2018-03-20 ENCOUNTER — Other Ambulatory Visit: Payer: Self-pay | Admitting: Internal Medicine

## 2018-03-20 DIAGNOSIS — Z1231 Encounter for screening mammogram for malignant neoplasm of breast: Secondary | ICD-10-CM

## 2018-04-27 ENCOUNTER — Ambulatory Visit
Admission: RE | Admit: 2018-04-27 | Discharge: 2018-04-27 | Disposition: A | Discharge status: Home / Self Care | Payer: BLUE CROSS/BLUE SHIELD | Source: Ambulatory Visit | Attending: Internal Medicine | Admitting: Internal Medicine

## 2018-04-27 DIAGNOSIS — Z1231 Encounter for screening mammogram for malignant neoplasm of breast: Secondary | ICD-10-CM

## 2018-07-29 ENCOUNTER — Other Ambulatory Visit: Payer: Self-pay

## 2018-07-29 ENCOUNTER — Encounter: Payer: Self-pay | Admitting: Emergency Medicine

## 2018-07-29 ENCOUNTER — Ambulatory Visit: Payer: Self-pay | Admitting: Emergency Medicine

## 2018-07-29 VITALS — BP 133/77 | HR 80 | Temp 98.1°F | Resp 16 | Ht 63.25 in | Wt 144.6 lb

## 2018-07-29 DIAGNOSIS — Z024 Encounter for examination for driving license: Secondary | ICD-10-CM

## 2018-07-29 NOTE — Progress Notes (Signed)
This patient presents for DOT examination for fitness for duty.   Medical History:  1. Head/Brain Injuries, disorders or illnesses no  2. Seizures, epilepsy no  3. Eye disorders or impaired vision (except corrective lenses) no  4. Ear disorders, loss of hearing or balance no  5. Heart disease or heart attack, other cardiovascular condition no  6. Heart surgery (valve replacement/bypass, angioplasty, pacemaker/defribrillator) no  7. High blood pressure yes  8. High cholesterol no  9. Chronic cough, shortness of breath or other breathing problems no  10. Lung disease (emphysema, asthma or chronic bronchitis) no  11. Kidney disease, dialysis no  12. Digestive problems  yes  13. Diabetes or elevated blood sugar no                      If yes to #13, Insulin use no  14. Nervious or psychiatric disorders, e.g., severe depression no  15. Fainting or syncope no  16. Dizziness, headaches, numbness, tingling or memory loss no  17. Unexplained weight loss no  18. Stroke, TIA or paralysis no  19. Missing or impaired hand, arm, foot, leg, finger, toe no  20. Spinal injury or disease no  21. Bone, muscles or nerve problems no  22. Blood clots or bleeding bleeding disorders no  23. Cancer no  24. Chronic infection or other chronic diseases no  25. Sleep disorders, pauses in breathing while asleep, daytime sleepiness, loud snoring no  26. Have you ever had a sleep test? no  27.  Have you ever spent a night in the hospital? yes  28. Have you ever had a broken bone? no  29. Have you or or do you use tobacco products? no  30. Regular, frequent alcohol use no  31. Illegal substance use within the past 2 years no  32.  Have you ever failed a drug test or been dependent on an illegal substance? no   Current Medications: Prior to Admission medications   Medication Sig Start Date End Date Taking? Authorizing Provider  acetaminophen (TYLENOL) 325 MG tablet Take 325 mg by mouth at bedtime.     Yes [provider]  aspirin 81 MG tablet Take 81 mg by mouth daily.    Yes [provider]  Calcium Carbonate-Vitamin D (CALCIUM 600 + D PO) Take 1 tablet by mouth daily at 12 noon.    Yes [provider]  diclofenac sodium (VOLTAREN) 1 % GEL Apply 2 g topically 3 (three) times daily as needed (pain).   Yes [provider]  famotidine (PEPCID) 20 MG tablet Take 20 mg by mouth 2 (two) times daily.    Yes [provider]  hydrochlorothiazide (HYDRODIURIL) 25 MG tablet Take 25 mg by mouth daily.    Yes [provider]  levothyroxine (SYNTHROID, LEVOTHROID) 88 MCG tablet Take 88 mcg by mouth daily before breakfast.    Yes [provider]  Multiple Vitamin (MULTIVITAMIN) tablet Take 1 tablet by mouth daily at 12 noon.    Yes [provider]  Omega-3 Fatty Acids (EQL OMEGA 3 FISH OIL) 1400 MG CAPS Take 1,400 mg by mouth daily.    Yes [provider]  Probiotic Product (ALIGN) 4 MG CAPS Take 1 capsule by mouth daily.    Yes [provider]  raloxifene (EVISTA) 60 MG tablet Take 60 mg by mouth daily.    Yes [provider]  traMADol (ULTRAM) 50 MG tablet Take 1-2 tablets (50-100  mg total) by mouth every 6 (six) hours as needed for moderate pain or severe pain. 06/06/17  Yes Michael Boston, MD  valACYclovir (VALTREX) 1000 MG tablet Take 1,000 mg by mouth daily.  02/11/11  Yes [provider]  dicyclomine (BENTYL) 20 MG tablet Take 1 tablet (20 mg total) by mouth 3 (three) times daily as needed for spasms. Patient not taking: Reported on 07/31/2017 02/14/17   Pyrtle, Lajuan Lines, MD  Magnesium 250 MG TABS Take 250 mg by mouth daily at 12 noon.     [provider]  traMADol (ULTRAM) 50 MG tablet Take 1 tablet (50 mg total) by mouth every 6 (six) hours as needed for moderate pain. Patient not taking: Reported on 07/31/2017 07/31/14   Michael Boston, MD    Medical Examiner's Comments on Health History:    Stable good general medical condition.  TESTING:   Visual Acuity Screening   Right eye Left eye Both eyes  Without correction:     With correction: 20/40 20/25 20/25   Comments: Colors: 6/6 Periph: R eye 85 degrees   L eye 85 degrees  Hearing Screening Comments: Whisper test: R ear 10 ft  L ear 10 ft  Monocular Vision: No.  Hearing Aid used for test: No. Hearing Aid required to to meet standard: No.  BP 133/77   Pulse 80   Temp 98.1 F (36.7 C) (Oral)   Resp 16   Ht 5' 3.25" (1.607 m)   Wt 144 lb 9.6 oz (65.6 kg)   SpO2 100%   BMI 25.41 kg/m  Pulse rate is regular  Comments: U/A: protein-negative, blood-negative, spg-1.005, glucose-negative  PHYSICAL EXAMINATION:  General Appearance Not markedly obese. No tremor, signs of alcoholism, problem drinking or drug abuse.   Skin Warm, dry and intact.   Eyes Pupils are equal, round and reactive to light and accommodation, extraocular movements are intact. No exophthalmos, no nystagmus.   Ears Normal external ears. External canal without occlusion. No scarring of the TM. No perforation of the TM.  Mouth and Throat Clear and moist. No irremedial deformities likely to interfere with breathing or swallowing.  Heart No murmurs, extra sounds, evidence of cardiomegaly. No pacemaker. No implantable defibrillator.  Lungs and Chest (excluding breasts) Normal chest expansion, respiratory rate, breath sounds. No cyanosis.  Abdomen and Viscera No liver enlargement. No splenic enlargement. No masses, bruits, hernias or significant abdominal wall weakness.  Genitourinary  No inguinal or femoral hernia.  Spine and other musculoskeletal No tenderness, no limitation of motion, no deformities. No evidence of previous surgery.  Extremities No loss or impairment of leg, foot, toe, arm, hand, finger. No perceptible limp, deformities, atrophy, weakness, paralysis, clubbing, edema, hypotonia. Patient has sufficient grasp and prehension to maintain  steering wheel grip. Patient has sufficient mobility and strength in the lower limbs to operate pedals properly.  Neurologic Normal equilibrium, coordination, speech pattern. No paresthesia, asymmetry of deep tendon reflexes, sensory or positional abnormalities. No abnormality of patellar or Babinski's reflexes.  Gait Not antalgic or ataxic  Vascular Normal pulses. No carotid or arterial bruits. No varicose veins.    Certification Status:  Meets standards, but periodic monitoring required due to: HTN  Driver qualified only for: 1 year      Certification expires 07/30/2019.

## 2018-07-29 NOTE — Patient Instructions (Addendum)
Health Maintenance, Female Adopting a healthy lifestyle and getting preventive care can go a long way to promote health and wellness. Talk with your health care provider about what schedule of regular examinations is right for you. This is a good chance for you to check in with your provider about disease prevention and staying healthy. In between checkups, there are plenty of things you can do on your own. Experts have done a lot of research about which lifestyle changes and preventive measures are most likely to keep you healthy. Ask your health care provider for more information. Weight and diet Eat a healthy diet  Be sure to include plenty of vegetables, fruits, low-fat dairy products, and lean protein.  Do not eat a lot of foods high in solid fats, added sugars, or salt.  Get regular exercise. This is one of the most important things you can do for your health. ? Most adults should exercise for at least 150 minutes each week. The exercise should increase your heart rate and make you sweat (moderate-intensity exercise). ? Most adults should also do strengthening exercises at least twice a week. This is in addition to the moderate-intensity exercise.  Maintain a healthy weight  Body mass index (BMI) is a measurement that can be used to identify possible weight problems. It estimates body fat based on height and weight. Your health care provider can help determine your BMI and help you achieve or maintain a healthy weight.  For females 20 years of age and older: ? A BMI below 18.5 is considered underweight. ? A BMI of 18.5 to 24.9 is normal. ? A BMI of 25 to 29.9 is considered overweight. ? A BMI of 30 and above is considered obese.  Watch levels of cholesterol and blood lipids  You should start having your blood tested for lipids and cholesterol at 65 years of age, then have this test every 5 years.  You may need to have your cholesterol levels checked more often if: ? Your lipid or  cholesterol levels are high. ? You are older than 65 years of age. ? You are at high risk for heart disease.  Cancer screening Lung Cancer  Lung cancer screening is recommended for adults 55-80 years old who are at high risk for lung cancer because of a history of smoking.  A yearly low-dose CT scan of the lungs is recommended for people who: ? Currently smoke. ? Have quit within the past 15 years. ? Have at least a 30-pack-year history of smoking. A pack year is smoking an average of one pack of cigarettes a day for 1 year.  Yearly screening should continue until it has been 15 years since you quit.  Yearly screening should stop if you develop a health problem that would prevent you from having lung cancer treatment.  Breast Cancer  Practice breast self-awareness. This means understanding how your breasts normally appear and feel.  It also means doing regular breast self-exams. Let your health care provider know about any changes, no matter how small.  If you are in your 20s or 30s, you should have a clinical breast exam (CBE) by a health care provider every 1-3 years as part of a regular health exam.  If you are 40 or older, have a CBE every year. Also consider having a breast X-ray (mammogram) every year.  If you have a family history of breast cancer, talk to your health care provider about genetic screening.  If you are at high risk   for breast cancer, talk to your health care provider about having an MRI and a mammogram every year.  Breast cancer gene (BRCA) assessment is recommended for women who have family members with BRCA-related cancers. BRCA-related cancers include: ? Breast. ? Ovarian. ? Tubal. ? Peritoneal cancers.  Results of the assessment will determine the need for genetic counseling and BRCA1 and BRCA2 testing.  Cervical Cancer Your health care provider may recommend that you be screened regularly for cancer of the pelvic organs (ovaries, uterus, and  vagina). This screening involves a pelvic examination, including checking for microscopic changes to the surface of your cervix (Pap test). You may be encouraged to have this screening done every 3 years, beginning at age 22.  For women ages 56-65, health care providers may recommend pelvic exams and Pap testing every 3 years, or they may recommend the Pap and pelvic exam, combined with testing for human papilloma virus (HPV), every 5 years. Some types of HPV increase your risk of cervical cancer. Testing for HPV may also be done on women of any age with unclear Pap test results.  Other health care providers may not recommend any screening for nonpregnant women who are considered low risk for pelvic cancer and who do not have symptoms. Ask your health care provider if a screening pelvic exam is right for you.  If you have had past treatment for cervical cancer or a condition that could lead to cancer, you need Pap tests and screening for cancer for at least 20 years after your treatment. If Pap tests have been discontinued, your risk factors (such as having a new sexual partner) need to be reassessed to determine if screening should resume. Some women have medical problems that increase the chance of getting cervical cancer. In these cases, your health care provider may recommend more frequent screening and Pap tests.  Colorectal Cancer  This type of cancer can be detected and often prevented.  Routine colorectal cancer screening usually begins at 65 years of age and continues through 65 years of age.  Your health care provider may recommend screening at an earlier age if you have risk factors for colon cancer.  Your health care provider may also recommend using home test kits to check for hidden blood in the stool.  A small camera at the end of a tube can be used to examine your colon directly (sigmoidoscopy or colonoscopy). This is done to check for the earliest forms of colorectal  cancer.  Routine screening usually begins at age 33.  Direct examination of the colon should be repeated every 5-10 years through 65 years of age. However, you may need to be screened more often if early forms of precancerous polyps or small growths are found.  Skin Cancer  Check your skin from head to toe regularly.  Tell your health care provider about any new moles or changes in moles, especially if there is a change in a mole's shape or color.  Also tell your health care provider if you have a mole that is larger than the size of a pencil eraser.  Always use sunscreen. Apply sunscreen liberally and repeatedly throughout the day.  Protect yourself by wearing long sleeves, pants, a wide-brimmed hat, and sunglasses whenever you are outside.  Heart disease, diabetes, and high blood pressure  High blood pressure causes heart disease and increases the risk of stroke. High blood pressure is more likely to develop in: ? People who have blood pressure in the high end of  the normal range (130-139/85-89 mm Hg). ? People who are overweight or obese. ? People who are African American.  If you are 21-29 years of age, have your blood pressure checked every 3-5 years. If you are 3 years of age or older, have your blood pressure checked every year. You should have your blood pressure measured twice-once when you are at a hospital or clinic, and once when you are not at a hospital or clinic. Record the average of the two measurements. To check your blood pressure when you are not at a hospital or clinic, you can use: ? An automated blood pressure machine at a pharmacy. ? A home blood pressure monitor.  If you are between 17 years and 37 years old, ask your health care provider if you should take aspirin to prevent strokes.  Have regular diabetes screenings. This involves taking a blood sample to check your fasting blood sugar level. ? If you are at a normal weight and have a low risk for diabetes,  have this test once every three years after 65 years of age. ? If you are overweight and have a high risk for diabetes, consider being tested at a younger age or more often. Preventing infection Hepatitis B  If you have a higher risk for hepatitis B, you should be screened for this virus. You are considered at high risk for hepatitis B if: ? You were born in a country where hepatitis B is common. Ask your health care provider which countries are considered high risk. ? Your parents were born in a high-risk country, and you have not been immunized against hepatitis B (hepatitis B vaccine). ? You have HIV or AIDS. ? You use needles to inject street drugs. ? You live with someone who has hepatitis B. ? You have had sex with someone who has hepatitis B. ? You get hemodialysis treatment. ? You take certain medicines for conditions, including cancer, organ transplantation, and autoimmune conditions.  Hepatitis C  Blood testing is recommended for: ? Everyone born from [birth year] through 1963. ? Anyone with known risk factors for hepatitis C.  Sexually transmitted infections (STIs)  You should be screened for sexually transmitted infections (STIs) including gonorrhea and chlamydia if: ? You are sexually active and are younger than 65 years of age. ? You are older than 65 years of age and your health care provider tells you that you are at risk for this type of infection. ? Your sexual activity has changed since you were last screened and you are at an increased risk for chlamydia or gonorrhea. Ask your health care provider if you are at risk.  If you do not have HIV, but are at risk, it may be recommended that you take a prescription medicine daily to prevent HIV infection. This is called pre-exposure prophylaxis (PrEP). You are considered at risk if: ? You are sexually active and do not regularly use condoms or know the HIV status of your partner(s). ? You take drugs by injection. ? You are  sexually active with a partner who has HIV.  Talk with your health care provider about whether you are at high risk of being infected with HIV. If you choose to begin PrEP, you should first be tested for HIV. You should then be tested every 3 months for as long as you are taking PrEP. Pregnancy  If you are premenopausal and you may become pregnant, ask your health care provider about preconception counseling.  If you may become  pregnant, take 400 to 800 micrograms (mcg) of folic acid every day.  If you want to prevent pregnancy, talk to your health care provider about birth control (contraception). Osteoporosis and menopause  Osteoporosis is a disease in which the bones lose minerals and strength with aging. This can result in serious bone fractures. Your risk for osteoporosis can be identified using a bone density scan.  If you are 65 years of age or older, or if you are at risk for osteoporosis and fractures, ask your health care provider if you should be screened.  Ask your health care provider whether you should take a calcium or vitamin D supplement to lower your risk for osteoporosis.  Menopause may have certain physical symptoms and risks.  Hormone replacement therapy may reduce some of these symptoms and risks. Talk to your health care provider about whether hormone replacement therapy is right for you. Follow these instructions at home:  Schedule regular health, dental, and eye exams.  Stay current with your immunizations.  Do not use any tobacco products including cigarettes, chewing tobacco, or electronic cigarettes.  If you are pregnant, do not drink alcohol.  If you are breastfeeding, limit how much and how often you drink alcohol.  Limit alcohol intake to no more than 1 drink per day for nonpregnant women. One drink equals 12 ounces of beer, 5 ounces of wine, or 1 ounces of hard liquor.  Do not use street drugs.  Do not share needles.  Ask your health care  provider for help if you need support or information about quitting drugs.  Tell your health care provider if you often feel depressed.  Tell your health care provider if you have ever been abused or do not feel safe at home. This information is not intended to replace advice given to you by your health care provider. Make sure you discuss any questions you have with your health care provider. Document Released: 04/22/2011 Document Revised: 03/14/2016 Document Reviewed: 07/11/2015 Elsevier Interactive Patient Education  2018 [employer]     If you have lab work done today you will be contacted with your lab results within the next 2 weeks.  If you have not heard from us then please contact us. The fastest way to get your results is to register for My Chart.   IF you received an x-ray today, you will receive an invoice from [facility]. Please contact [facility] at [phone] with questions or concerns regarding your invoice.   IF you received labwork today, you will receive an invoice from [facility]. Please contact [facility] at [phone] with questions or concerns regarding your invoice.   Our billing staff will not be able to assist you with questions regarding bills from these companies.  You will be contacted with the lab results as soon as they are available. The fastest way to get your results is to activate your My Chart account. Instructions are located on the last page of this paperwork. If you have not heard from us regarding the results in 2 weeks, please contact this office.      

## 2018-12-15 ENCOUNTER — Telehealth: Payer: Self-pay | Admitting: Cardiology

## 2018-12-15 NOTE — Telephone Encounter (Signed)
FYI.  °Contacted patient regarding recall appointment, patient notified our office they did not wish to keep this appointment at this time.  Deleted recall from system. °

## 2019-05-14 ENCOUNTER — Other Ambulatory Visit: Payer: Self-pay | Admitting: Internal Medicine

## 2019-05-14 DIAGNOSIS — Z1231 Encounter for screening mammogram for malignant neoplasm of breast: Secondary | ICD-10-CM

## 2019-07-01 ENCOUNTER — Ambulatory Visit
Admission: RE | Admit: 2019-07-01 | Discharge: 2019-07-01 | Disposition: A | Discharge status: Home / Self Care | Payer: BLUE CROSS/BLUE SHIELD | Source: Ambulatory Visit | Attending: Internal Medicine | Admitting: Internal Medicine

## 2019-07-01 ENCOUNTER — Other Ambulatory Visit: Payer: Self-pay

## 2019-07-01 DIAGNOSIS — Z1231 Encounter for screening mammogram for malignant neoplasm of breast: Secondary | ICD-10-CM

## 2019-07-19 ENCOUNTER — Encounter: Payer: Self-pay | Admitting: Emergency Medicine

## 2019-07-19 ENCOUNTER — Ambulatory Visit (INDEPENDENT_AMBULATORY_CARE_PROVIDER_SITE_OTHER): Payer: BLUE CROSS/BLUE SHIELD | Admitting: Emergency Medicine

## 2019-07-19 ENCOUNTER — Other Ambulatory Visit: Payer: Self-pay

## 2019-07-19 VITALS — BP 133/78 | HR 72 | Temp 98.9°F | Resp 16 | Ht 63.5 in | Wt 147.2 lb

## 2019-07-19 DIAGNOSIS — Z024 Encounter for examination for driving license: Secondary | ICD-10-CM

## 2019-07-19 NOTE — Progress Notes (Signed)
BP Readings from Last 3 Encounters:  07/29/18 133/77  07/31/17 120/66  06/10/17 134/65       This patient presents for DOT examination for fitness for duty.   Medical History:  1. Head/Brain Injuries, disorders or illnesses no  2. Seizures, epilepsy no  3. Eye disorders or impaired vision (except corrective lenses) no  4. Ear disorders, loss of hearing or balance no  5. Heart disease or heart attack, other cardiovascular condition no  6. Heart surgery (valve replacement/bypass, angioplasty, pacemaker/defribrillator) no  7. High blood pressure yes  8. High cholesterol no  9. Chronic cough, shortness of breath or other breathing problems no  10. Lung disease (emphysema, asthma or chronic bronchitis) no  11. Kidney disease, dialysis no  12. Digestive problems  yes  13. Diabetes or elevated blood sugar no                      If yes to #13, Insulin use n/a  14. Nervious or psychiatric disorders, e.g., severe depression no  15. Fainting or syncope no  16. Dizziness, headaches, numbness, tingling or memory loss no  17. Unexplained weight loss no  18. Stroke, TIA or paralysis no  19. Missing or impaired hand, arm, foot, leg, finger, toe no  20. Spinal injury or disease no  21. Bone, muscles or nerve problems no  22. Blood clots or bleeding bleeding disorders no  23. Cancer yes  24. Chronic infection or other chronic diseases no  25. Sleep disorders, pauses in breathing while asleep, daytime sleepiness, loud snoring no  26. Have you ever had a sleep test? no  27.  Have you ever spent a night in the hospital? yes  28. Have you ever had a broken bone? no  29. Have you or or do you use tobacco products? no  30. Regular, frequent alcohol use no  31. Illegal substance use within the past 2 years no  32.  Have you ever failed a drug test or been dependent on an illegal substance? no   Current Medications: Prior to Admission medications   Medication Sig Start Date End Date Taking?  Authorizing Provider  acetaminophen (TYLENOL) 325 MG tablet Take 325 mg by mouth at bedtime.    Yes [provider]  aspirin 81 MG tablet Take 81 mg by mouth daily.    Yes [provider]  Calcium Carbonate-Vitamin D (CALCIUM 600 + D PO) Take 1 tablet by mouth daily at 12 noon.    Yes [provider]  diclofenac sodium (VOLTAREN) 1 % GEL Apply 2 g topically 3 (three) times daily as needed (pain).   Yes [provider]  famotidine (PEPCID) 20 MG tablet Take 20 mg by mouth 2 (two) times daily.    Yes [provider]  hydrochlorothiazide (HYDRODIURIL) 25 MG tablet Take 25 mg by mouth daily.    Yes [provider]  levothyroxine (SYNTHROID, LEVOTHROID) 88 MCG tablet Take 88 mcg by mouth daily before breakfast.    Yes [provider]  Magnesium 250 MG TABS Take 250 mg by mouth daily at 12 noon.    Yes [provider]  Multiple Vitamin (MULTIVITAMIN) tablet Take 1 tablet by mouth daily at 12 noon.    Yes [provider]  Omega-3 Fatty Acids (EQL OMEGA 3 FISH OIL) 1400 MG CAPS Take 1,400 mg by mouth daily.    Yes [provider]  Probiotic Product (ALIGN) 4 MG CAPS Take 1 capsule  by mouth daily.    Yes [provider]  raloxifene (EVISTA) 60 MG tablet Take 60 mg by mouth daily.    Yes [provider]  traMADol (ULTRAM) 50 MG tablet Take 1 tablet (50 mg total) by mouth every 6 (six) hours as needed for moderate pain. 07/31/14  Yes Michael Boston, MD  valACYclovir (VALTREX) 1000 MG tablet Take 1,000 mg by mouth daily.  02/11/11  Yes [provider]  dicyclomine (BENTYL) 20 MG tablet Take 1 tablet (20 mg total) by mouth 3 (three) times daily as needed for spasms. 02/14/17   Pyrtle, Lajuan Lines, MD  traMADol (ULTRAM) 50 MG tablet Take 1-2 tablets (50-100 mg total) by mouth every 6 (six) hours as needed for moderate pain or severe pain. Patient not taking: Reported on 07/19/2019 06/06/17   Michael Boston, MD     Medical Examiner's Comments on Health History:  In good general medical condition.  Stable chronic medical problems including well-controlled hypertension.  TESTING:   Hearing Screening   125Hz  250Hz  500Hz  1000Hz  2000Hz  3000Hz  4000Hz  6000Hz  8000Hz   Right ear:           Left ear:           Comments: Whisper test:  R ear 10 ft   L ear 10 ft    Visual Acuity Screening   Right eye Left eye Both eyes  Without correction:     With correction: 20/25-1 20/25 20/20  Comments: Colors: 6/6 Periph: R eye 85 degrees    L eye 85 degrees   Monocular Vision: No.  Hearing Aid used for test: No. Hearing Aid required to to meet standard: No.  BP 133/78   Pulse 72   Temp 98.9 F (37.2 C) (Oral)   Resp 16   Ht 5' 3.5" (1.613 m) Comment: PER PATIENT  Wt 147 lb 3.2 oz (66.8 kg)   SpO2 97%   BMI 25.67 kg/m  Pulse rate is regular  Comments: U/A: protein-negative, blood-negative, spg- 1.005, glucose-negative  PHYSICAL EXAMINATION:  General Appearance Not markedly obese. No tremor, signs of alcoholism, problem drinking or drug abuse.   Skin Warm, dry and intact.  Eyes Pupils are equal, round and reactive to light and accommodation, extraocular movements are intact. No exophthalmos, no nystagmus.   Ears Normal external ears. External canal without occlusion. No scarring of the TM. No perforation of the TM.  Mouth and Throat Clear and moist. No irremedial deformities likely to interfere with breathing or swallowing.  Heart No murmurs, extra sounds, evidence of cardiomegaly. No pacemaker. No implantable defibrillator.  Lungs and Chest (excluding breasts) Normal chest expansion, respiratory rate, breath sounds. No cyanosis.  Abdomen and Viscera No liver enlargement. No splenic enlargement. No masses, bruits, hernias or significant abdominal wall weakness.  Genitourinary  No inguinal or femoral hernia.  Spine and other musculoskeletal No tenderness, no limitation of motion, no deformities. No  evidence of previous surgery.  Extremities No loss or impairment of leg, foot, toe, arm, hand, finger. No perceptible limp, deformities, atrophy, weakness, paralysis, clubbing, edema, hypotonia. Patient has sufficient grasp and prehension to maintain steering wheel grip. Patient has sufficient mobility and strength in the lower limbs to operate pedals properly.  Neurologic Normal equilibrium, coordination, speech pattern. No paresthesia, asymmetry of deep tendon reflexes, sensory or positional abnormalities. No abnormality of patellar or Babinski's reflexes.  Gait Not antalgic or ataxic  Vascular Normal pulses. No carotid or arterial bruits. No varicose veins.    Certification Status: Meets  standards, but periodic monitoring required due to: Hypertension  Driver qualified only for: 1 year     Certification expires 07/18/2020.

## 2019-07-19 NOTE — Patient Instructions (Addendum)
   If you have lab work done today you will be contacted with your lab results within the next 2 weeks.  If you have not heard from us then please contact us. The fastest way to get your results is to register for My Chart.   IF you received an x-ray today, you will receive an invoice from [facility]. Please contact [facility] at [phone] with questions or concerns regarding your invoice.   IF you received labwork today, you will receive an invoice from [facility]. Please contact [facility] at [phone] with questions or concerns regarding your invoice.   Our billing staff will not be able to assist you with questions regarding bills from these companies.  You will be contacted with the lab results as soon as they are available. The fastest way to get your results is to activate your My Chart account. Instructions are located on the last page of this paperwork. If you have not heard from us regarding the results in 2 weeks, please contact this office.     Health Maintenance After Age 65 After age 65, you are at a higher risk for certain long-term diseases and infections as well as injuries from falls. Falls are a major cause of broken bones and head injuries in people who are older than age 65. Getting regular preventive care can help to keep you healthy and well. Preventive care includes getting regular testing and making lifestyle changes as recommended by your health care provider. Talk with your health care provider about:  Which screenings and tests you should have. A screening is a test that checks for a disease when you have no symptoms.  A diet and exercise plan that is right for you. What should I know about screenings and tests to prevent falls? Screening and testing are the best ways to find a health problem early. Early diagnosis and treatment give you the best chance of managing medical conditions that are common after age 65. Certain conditions and  lifestyle choices may make you more likely to have a fall. Your health care provider may recommend:  Regular vision checks. Poor vision and conditions such as cataracts can make you more likely to have a fall. If you wear glasses, make sure to get your prescription updated if your vision changes.  Medicine review. Work with your health care provider to regularly review all of the medicines you are taking, including over-the-counter medicines. Ask your health care provider about any side effects that may make you more likely to have a fall. Tell your health care provider if any medicines that you take make you feel dizzy or sleepy.  Osteoporosis screening. Osteoporosis is a condition that causes the bones to get weaker. This can make the bones weak and cause them to break more easily.  Blood pressure screening. Blood pressure changes and medicines to control blood pressure can make you feel dizzy.  Strength and balance checks. Your health care provider may recommend certain tests to check your strength and balance while standing, walking, or changing positions.  Foot health exam. Foot pain and numbness, as well as not wearing proper footwear, can make you more likely to have a fall.  Depression screening. You may be more likely to have a fall if you have a fear of falling, feel emotionally low, or feel unable to do activities that you used to do.  Alcohol use screening. Using too much alcohol can affect your balance and may make you more likely to   have a fall. What actions can I take to lower my risk of falls? General instructions  Talk with your health care provider about your risks for falling. Tell your health care provider if: ? You fall. Be sure to tell your health care provider about all falls, even ones that seem minor. ? You feel dizzy, sleepy, or off-balance.  Take over-the-counter and prescription medicines only as told by your health care provider. These include any  supplements.  Eat a healthy diet and maintain a healthy weight. A healthy diet includes low-fat dairy products, low-fat (lean) meats, and fiber from whole grains, beans, and lots of fruits and vegetables. Home safety  Remove any tripping hazards, such as rugs, cords, and clutter.  Install safety equipment such as grab bars in bathrooms and safety rails on stairs.  Keep rooms and walkways well-lit. Activity   Follow a regular exercise program to stay fit. This will help you maintain your balance. Ask your health care provider what types of exercise are appropriate for you.  If you need a cane or walker, use it as recommended by your health care provider.  Wear supportive shoes that have nonskid soles. Lifestyle  Do not drink alcohol if your health care provider tells you not to drink.  If you drink alcohol, limit how much you have: ? 0-1 drink a day for women. ? 0-2 drinks a day for men.  Be aware of how much alcohol is in your drink. In the [place] one drink equals one typical bottle of beer (12 oz), one-half glass of wine (5 oz), or one shot of hard liquor (1 oz).  Do not use any products that contain nicotine or tobacco, such as cigarettes and e-cigarettes. If you need help quitting, ask your health care provider. Summary  Having a healthy lifestyle and getting preventive care can help to protect your health and wellness after age 65.  Screening and testing are the best way to find a health problem early and help you avoid having a fall. Early diagnosis and treatment give you the best chance for managing medical conditions that are more common for people who are older than age 65.  Falls are a major cause of broken bones and head injuries in people who are older than age 65. Take precautions to prevent a fall at home.  Work with your health care provider to learn what changes you can make to improve your health and wellness and to prevent falls. This information is not intended  to replace advice given to you by your health care provider. Make sure you discuss any questions you have with your health care provider. Document Released: 08/20/2017 Document Revised: 01/28/2019 Document Reviewed: 08/20/2017 Elsevier Patient Education  2020 Elsevier Inc.  

## 2020-02-10 ENCOUNTER — Encounter: Payer: Self-pay | Admitting: Cardiology

## 2020-02-10 ENCOUNTER — Other Ambulatory Visit: Payer: Self-pay

## 2020-02-10 ENCOUNTER — Ambulatory Visit (INDEPENDENT_AMBULATORY_CARE_PROVIDER_SITE_OTHER): Payer: BC Managed Care – PPO | Admitting: Cardiology

## 2020-02-10 ENCOUNTER — Other Ambulatory Visit: Payer: Self-pay | Admitting: Cardiology

## 2020-02-10 VITALS — BP 130/82 | HR 76 | Temp 97.0°F | Ht 63.5 in | Wt 146.0 lb

## 2020-02-10 DIAGNOSIS — E7800 Pure hypercholesterolemia, unspecified: Secondary | ICD-10-CM

## 2020-02-10 DIAGNOSIS — Z01812 Encounter for preprocedural laboratory examination: Secondary | ICD-10-CM

## 2020-02-10 DIAGNOSIS — I10 Essential (primary) hypertension: Secondary | ICD-10-CM

## 2020-02-10 DIAGNOSIS — R072 Precordial pain: Secondary | ICD-10-CM

## 2020-02-10 DIAGNOSIS — Z8249 Family history of ischemic heart disease and other diseases of the circulatory system: Secondary | ICD-10-CM

## 2020-02-10 DIAGNOSIS — I2510 Atherosclerotic heart disease of native coronary artery without angina pectoris: Secondary | ICD-10-CM

## 2020-02-10 DIAGNOSIS — Z7189 Other specified counseling: Secondary | ICD-10-CM

## 2020-02-10 MED ORDER — METOPROLOL TARTRATE 50 MG PO TABS: ORAL_TABLET | ORAL | 0 refills | Status: DC

## 2020-02-10 MED ORDER — ROSUVASTATIN CALCIUM 5 MG PO TABS: ORAL_TABLET | ORAL | 0 refills | Status: DC

## 2020-02-10 NOTE — Progress Notes (Signed)
Cardiology Office Note:    Date:  02/10/2020   ID:  [name], DOB Feb 24, 1951, MRN [id number]  PCP:  Eber Hong, MD  Cardiologist:  Buford Dresser, MD  Referring MD: Eber Hong, MD   CC: new patient consultation for chest pain and CV risk  History of Present Illness:    [name] is a 69 y.o. female with a hx of hypertension, hyperlipidemia, hypothyroidism, GERD s/p Nissen, chronic pain who is seen as a new consult at the request of Eber Hong, MD for the evaluation and management of chest pain and CV risk given prior coronary calcium.  Notes received and reviewed from office visit with Dr. Brynda Greathouse at [facility] on 02/01/2020. There was no specific mention of cardiac testing or concerns at this visit. Labs dated 01/25/20 show normal CMP. Lipids show Tchol 204, TG 118, HDL 62, LDL 118.  I do not see any recent imaging that would evaluate coronary calcium; last imaging noting this was in 2015. I reviewed both our EMR and [facility] via [employer]. Per referral note from 02/03/20, she was due for annual follow up but has not been seen in 3 years, so required new referral. Last seen by Dr. Bronson Ing 2017 and Dr. Harl Bowie in 2015.  Cardiac history: coronary artery calcification on CT 05/2014 (noted as LAD), normal nuclear stress test 2012 (per report). Known history of LBBB  Today: Had an episode 01/25/20. Was in bed, watching TV, falling asleep. Had sudden onset upper abdominal/epigastric pain that was severe. She thought it was indigestion or gas, got up to walk around as this usually helps. Felt lightheaded with walking, but had to sit down. She was pouring with sweat at the time. Propped herself up with pillows until it subsided. Had to change pajamas/dry her hair because of so much sweating. Able to eventually go to bed. Was alone at the time, wasn't sure if she needed to call EMS.   No more severe episodes since then. Does note sharp pains in her upper abdomen with  lying in bed at night.   Cardiovascular risk factors: Prior clinical ASCVD: none Comorbid conditions: hypertension, hyperlipidemia. Denies diabetes, chronic kidney disease. Metabolic syndrome/Obesity: none, BMI 25 Chronic inflammatory conditions: none Tobacco use history: never, but husband smokes heavily (2 ppd) and she has significant secondhand exposure. Family history: mother had hypertension, HF. Maternal grandfather also had HF. Father had PAD and and hypertension. Sister is 31, just had a stroke from high cholesterol. Prior cardiac testing and/or incidental findings on other testing: LAD coronary calcium 2015 Exercise level: Used to walk 1 hour/day, but since the pandemic this has decreased. Tries to walk on weekends, but has been more sedentary. Current diet: likes eggs, bacon, sausage, toast, biscuits. Eats a large breakfast daily. Lunch could be salad, chicken, a sandwich, or sometimes she skips. Eats very light dinner, usually yogurt or fruit.  Reviewed her labs from PCP. Has never been on statin, even when told she has coronary calcium. Does have chronic joint/muscle aches, worried this may worsen with statin.   Denies any more chest pain, shortness of breath at rest or with normal exertion. No PND, orthopnea, LE edema or unexpected weight gain. No syncope or palpitations.  Past Medical History:  Diagnosis Date  . Anemia   . Arthritis   . Barrett's esophagus   . Cataract   . Diverticulosis of colon (without mention of hemorrhage)   . Duodenitis 04/12/2011  . GASTRITIS 10/05/2008   Qualifier: Diagnosis of  By: Heloise Ochoa, Manuela Schwartz    . GERD (gastroesophageal reflux disease)   . Hiatal hernia   . Hypertension   . Hypothyroidism   . IBS (irritable bowel syndrome)   . Irritable bowel syndrome with diarrhea   . S/P redo revision of Lap Nissen Oct 2015 04/12/2011  . Skin cancer   . Thyroid disease     Past Surgical History:  Procedure Laterality Date  . ABDOMINAL  HYSTERECTOMY    . APPENDECTOMY    . COLONOSCOPY  2009   562.10  . EYE SURGERY     eyelid  . FLEXOR TENDON REPAIR Right 11/23/2015   Procedure: RECONSTUCTION FLEXOR DIGITORUM PROFUNDUS RIGHT SMALL FINGER  POLLICIS LONGUS GRAFT ;  Surgeon: Daryll Brod, MD;  Location: [facility];  Service: Orthopedics;  Laterality: Right;  . LAPAROSCOPIC NISSEN FUNDOPLICATION N/A [id number]   Procedure: REDO NISSEN FUNDOPLICATION WITH UPPER ENDOSCOPY;  Surgeon: Kaylyn Lim, MD;  Location: [facility];  Service: General;  Laterality: N/A;  . NISSEN FUNDOPLICATION  [id number]  . PROCTOSCOPY N/A 06/06/2017   Procedure: RIGID PROCTOSCOPY;  Surgeon: Michael Boston, MD;  Location: [facility];  Service: General;  Laterality: N/A;  . skin cancer removal from right thigh     . TMJ ARTHROPLASTY    . TOTAL VAGINAL HYSTERECTOMY    . TRIGGER FINGER RELEASE      Current Medications: Current Outpatient Medications on File Prior to Visit  Medication Sig  . acetaminophen (TYLENOL) 325 MG tablet Take 325 mg by mouth at bedtime.   Marland Kitchen aspirin 81 MG tablet Take 81 mg by mouth daily.   . Calcium Carbonate-Vitamin D (CALCIUM 600 + D PO) Take 1 tablet by mouth daily at 12 noon.   . Calcium Carbonate-Vitamin D 600-200 MG-UNIT TABS 1 po bid  . diclofenac sodium (VOLTAREN) 1 % GEL Apply 2 g topically 3 (three) times daily as needed (pain).  Marland Kitchen dicyclomine (BENTYL) 20 MG tablet Take 1 tablet (20 mg total) by mouth 3 (three) times daily as needed for spasms.  . famotidine (PEPCID) 20 MG tablet Take by mouth.  . hydrochlorothiazide (HYDRODIURIL) 25 MG tablet Take 25 mg by mouth daily.   . hydrochlorothiazide (HYDRODIURIL) 25 MG tablet Take by mouth.  . levothyroxine (SYNTHROID) 88 MCG tablet TAKE 1 TABLET BY MOUTH DAILY  . Multiple Vitamin (MULTI-VITAMIN) tablet Take by mouth.  . Omega-3 Fatty Acids (EQL OMEGA 3 FISH OIL) 1400 MG CAPS Take 1,400 mg by mouth daily.   . raloxifene (EVISTA) 60 MG tablet TAKE 1 TABLET BY MOUTH EVERY DAY  .  traMADol (ULTRAM) 50 MG tablet TAKE 1 TO 2 TABLETS BY MOUTH EVERY NIGHT AT BEDTIME AS NEEDED FOR PAIN  . valACYclovir (VALTREX) 1000 MG tablet Take 1,000 mg by mouth daily.    Current Facility-Administered Medications on File Prior to Visit  Medication  . 0.9 %  sodium chloride infusion     Allergies:   Alendronate sodium, Codeine, Ciprofloxacin, and Tetracycline   Social History   Tobacco Use  . Smoking status: Never Smoker  . Smokeless tobacco: Never Used  Substance Use Topics  . Alcohol use: Yes    Comment: social  . Drug use: No    Family History: family history includes Diabetes in her mother; Heart disease in her mother; Hypertension in her father and mother; Throat cancer (age of onset: 19) in her father. There is no history of Colon cancer.  ROS:   Please see the history of present illness.  Additional pertinent ROS: Constitutional: Negative for chills, fever, night sweats, unintentional weight loss  HENT: Negative for ear pain and hearing loss.   Eyes: Negative for loss of vision and eye pain.  Respiratory: Negative for cough, sputum, wheezing.   Cardiovascular: See HPI. Gastrointestinal: Negative for abdominal pain, melena, and hematochezia.  Genitourinary: Negative for dysuria and hematuria.  Musculoskeletal: Negative for falls and myalgias.  Skin: Negative for itching and rash.  Neurological: Negative for focal weakness, focal sensory changes and loss of consciousness.  Endo/Heme/Allergies: Does not bruise/bleed easily.     EKGs/Labs/Other Studies Reviewed:    The following studies were reviewed today: CT 2015 images personally reviewed, scant coronary calcium in proximal LAD and possibly proximal LCX.  EKG:  EKG is personally reviewed.  The ekg ordered today demonstrates NSR, LBBB  Recent Labs: No results found for requested labs within last 8760 hours.  Recent Lipid Panel    Component Value Date/Time   CHOL 215 06/05/2010 0000   TRIG 95 06/05/2010  0000   HDL 58.3 06/05/2010 0000   LDLCALC 137.7 06/05/2010 0000    Physical Exam:    VS:  BP 130/82   Pulse 76   Temp (!) 97 F (36.1 C)   Ht 5' 3.5" (1.613 m)   Wt 146 lb (66.2 kg)   SpO2 97%   BMI 25.46 kg/m     Wt Readings from Last 3 Encounters:  02/10/20 146 lb (66.2 kg)  07/19/19 147 lb 3.2 oz (66.8 kg)  07/29/18 144 lb 9.6 oz (65.6 kg)    GEN: Well nourished, well developed in no acute distress HEENT: Normal, moist mucous membranes NECK: No JVD CARDIAC: regular rhythm, normal S1 and S2, no rubs or gallops. No murmurs. VASCULAR: Radial and DP pulses 2+ bilaterally. No carotid bruits RESPIRATORY:  Clear to auscultation without rales, wheezing or rhonchi  ABDOMEN: Soft, non-tender, non-distended MUSCULOSKELETAL:  Ambulates independently SKIN: Warm and dry, no edema NEUROLOGIC:  Alert and oriented x 3. No focal neuro deficits noted. PSYCHIATRIC:  Normal affect    ASSESSMENT:    1. Precordial pain   2. Hypercholesteremia   3. Pre-procedure lab exam   4. Coronary artery calcification seen on CT scan   5. Essential hypertension   6. Cardiac risk counseling   7. Counseling on health promotion and disease prevention   8. Family history of heart disease    PLAN:    Episode of epigastric/upper abdominal/lower chest pain 01/25/20: concerning given severity of symptoms, associated symptoms, and duration. Diaphoresis especially concerning -risk factors of coronary calcium, hypertension, hypercholesterolemia, family history -discussed treadmill stress (not indicated given LBBB), nuclear stress/lexiscan, and CT coronary angiography. Discussed pros and cons of each, including but not limited to false positive/false negative risk, radiation risk, and risk of IV contrast dye. Based on shared decision making, decision was made to pursue CT coronary angiography. -will give one time dose of metoprolol 2 hours prior to scheduled test -counseled on need to get BMET prior to  test -counseled on use of sublingual nitroglycerin and its importance to a good test -does have known calcium in the coronary bed, but on imaging in 2015 this was mild. Did discuss that severe calcium can obscure coronary filling. -counseled on red flag warning signs that need immediate medical attention -starting statin, below -continue aspirin 81 mg  Hypercholesterolemia: -discussed pathology of cholesterol, how plaques form, that MI/CVA result commonly from acute plaque rupture and not gradual stenosis. Discussed mechanism of statin to both decrease  plaque accumulation and stabilize plaque that is already present. Discussed that calcium is a marker for plaque, with decades of validated data regarding average amounts of calcium for age/gender/ethnicity, as well as value of calcium score for risk stratification -we discussed the data on statins, both in terms of their long term benefit as well as the risk of side effects. Reviewed common misconceptions about statins. Reviewed how we monitor treatment. After shared decision making, patient is agreeable to trialing statin. -will start low dose rosuvastatin 5 mg and ramp up 5 mg every week. Once she tolerates 20 mg dose, will refill this as long term dose -was taking OTC fish oil. Discussed lack of data for cardiovascular indications for this, and she would like to stop. Ok to stop from my perspective.  Hypertension: at goal today -continue HCTZ  Cardiac risk counseling and prevention recommendations: -recommend heart healthy/Mediterranean diet, with whole grains, fruits, vegetable, fish, lean meats, nuts, and olive oil. Limit salt. -recommend moderate walking, 3-5 times/week for 30-50 minutes each session. Aim for at least 150 minutes.week. Goal should be pace of 3 miles/hours, or walking 1.5 miles in 30 minutes -recommend avoidance of tobacco products. Avoid excess alcohol. -Additional risk factor control:  -Diabetes risk: A1c is 5.3 in 2018,  denies history  -Lipids: as above  -Blood pressure control: as above  -Weight: BMI 25 -ASCVD risk score: 8.2% 10 year risk  Plan for follow up: 2 mos to check lipids, sooner if CT markedly abnormal  Buford Dresser, MD, PhD [place]  [facility]    Medication Adjustments/Labs and Tests Ordered: Current medicines are reviewed at length with the patient today.  Concerns regarding medicines are outlined above.  Orders Placed This Encounter  Procedures  . CT CORONARY MORPH W/CTA COR W/SCORE W/CA W/CM &/OR WO/CM  . CT CORONARY FRACTIONAL FLOW RESERVE DATA PREP  . CT CORONARY FRACTIONAL FLOW RESERVE FLUID ANALYSIS  . Basic metabolic panel  . EKG 12-Lead   Meds ordered this encounter  Medications  . rosuvastatin (CRESTOR) 5 MG tablet    Sig: Take 1 tablet (5 mg total) by mouth daily for 7 days, THEN 2 tablets (10 mg total) daily for 7 days, THEN 3 tablets (15 mg total) daily for 7 days, THEN 4 tablets (20 mg total) daily for 7 days.    Dispense:  70 tablet    Refill:  0  . metoprolol tartrate (LOPRESSOR) 50 MG tablet    Sig: TAKE 1 TABLET 2 HR PRIOR TO CARDIAC PROCEDURE    Dispense:  1 tablet    Refill:  0    Patient Instructions  Medication Instructions:  Rosuvastatin 5 mg Take 1 tablet (5 mg total) by mouth daily for 7 days, THEN 2 tablets (10 mg total) daily for 7 days, THEN 3 tablets (15 mg total) daily for 7 days, THEN 4 tablets (20 mg total) daily for 7   *If you need a refill on your cardiac medications before your next appointment, please call your pharmacy*   Lab Work: Your physician recommends that you return for lab work 1 week prior to test (BMP).  If you have labs (blood work) drawn today and your tests are completely normal, you will receive your results only by: Marland Kitchen MyChart Message (if you have MyChart) OR . A paper copy in the mail If you have any lab test that is abnormal or we need to change your treatment, we will call you to review the  results.   Testing/Procedures:  Cardiac CT Angiography (CTA), is a special type of CT scan that uses a computer to produce multi-dimensional views of major blood vessels throughout the body. In CT angiography, a contrast material is injected through an IV to help visualize the blood vessels [facility]   Follow-Up: At [facility], you and your health needs are our priority.  As part of our continuing mission to provide you with exceptional heart care, we have created designated Provider Care Teams.  These Care Teams include your primary Cardiologist (physician) and Advanced Practice Providers (APPs -  Physician Assistants and Nurse Practitioners) who all work together to provide you with the care you need, when you need it.  We recommend signing up for the patient portal called "MyChart".  Sign up information is provided on this After Visit Summary.  MyChart is used to connect with patients for Virtual Visits (Telemedicine).  Patients are able to view lab/test results, encounter notes, upcoming appointments, etc.  Non-urgent messages can be sent to your provider as well.   To learn more about what you can do with MyChart, go to [email or web address].    Your next appointment:   2 month(s)  The format for your next appointment:   In Person  Provider:   Buford Dresser, MD  Your cardiac CT will be scheduled at one of the below locations:   [facility] [address] [place], [place] [address] [phone]  If scheduled at [facility], please arrive at the [facility] main entrance of [facility] 30 minutes prior to test start time. Proceed to the [facility] Radiology Department (first floor) to check-in and test prep.  If scheduled at [facility], please arrive 15 mins early for check-in and test prep.  Please follow these instructions carefully (unless otherwise directed):   On the Night Before the  Test: . Be sure to Drink plenty of water. . Do not consume any caffeinated/decaffeinated beverages or chocolate 12 hours prior to your test. . Do not take any antihistamines 12 hours prior to your test.   On the Day of the Test: . Drink plenty of water. Do not drink any water within one hour of the test. . Do not eat any food 4 hours prior to the test. . You may take your regular medications prior to the test.  . Take metoprolol 50 (Lopressor) two hours prior to test. . HOLD Hydrochlorothiazide 25 mg morning of the test. . FEMALES- please wear underwire-free bra if available       After the Test: . Drink plenty of water. . After receiving IV contrast, you may experience a mild flushed feeling. This is normal. . On occasion, you may experience a mild rash up to 24 hours after the test. This is not dangerous. If this occurs, you can take Benadryl 25 mg and increase your fluid intake. . If you experience trouble breathing, this can be serious. If it is severe call 911 IMMEDIATELY. If it is mild, please call our office. . If you take any of these medications: Glipizide/Metformin, Avandament, Glucavance, please do not take 48 hours after completing test unless otherwise instructed.   Once we have confirmed authorization from your insurance company, we will call you to set up a date and time for your test.   For non-scheduling related questions, please contact the cardiac imaging nurse navigator should you have any questions/concerns: Marchia Bond, RN Navigator Cardiac Imaging [facility] [phone] office  For scheduling needs, including cancellations and rescheduling, please call [phone].      Signed, Buford Dresser, MD PhD 02/10/2020 5:30 PM    [place]

## 2020-02-10 NOTE — Patient Instructions (Addendum)
Medication Instructions:  Rosuvastatin 5 mg Take 1 tablet (5 mg total) by mouth daily for 7 days, THEN 2 tablets (10 mg total) daily for 7 days, THEN 3 tablets (15 mg total) daily for 7 days, THEN 4 tablets (20 mg total) daily for 7   *If you need a refill on your cardiac medications before your next appointment, please call your pharmacy*   Lab Work: Your physician recommends that you return for lab work 1 week prior to test (BMP).  If you have labs (blood work) drawn today and your tests are completely normal, you will receive your results only by: Marland Kitchen MyChart Message (if you have MyChart) OR . A paper copy in the mail If you have any lab test that is abnormal or we need to change your treatment, we will call you to review the results.   Testing/Procedures: Cardiac CT Angiography (CTA), is a special type of CT scan that uses a computer to produce multi-dimensional views of major blood vessels throughout the body. In CT angiography, a contrast material is injected through an IV to help visualize the blood vessels [facility]   Follow-Up: At [facility], you and your health needs are our priority.  As part of our continuing mission to provide you with exceptional heart care, we have created designated Provider Care Teams.  These Care Teams include your primary Cardiologist (physician) and Advanced Practice Providers (APPs -  Physician Assistants and Nurse Practitioners) who all work together to provide you with the care you need, when you need it.  We recommend signing up for the patient portal called "MyChart".  Sign up information is provided on this After Visit Summary.  MyChart is used to connect with patients for Virtual Visits (Telemedicine).  Patients are able to view lab/test results, encounter notes, upcoming appointments, etc.  Non-urgent messages can be sent to your provider as well.   To learn more about what you can do with MyChart, go to [email or web address].     Your next appointment:   2 month(s)  The format for your next appointment:   In Person  Provider:   Buford Dresser, MD  Your cardiac CT will be scheduled at one of the below locations:   [facility] [address] [place], [place] [address] [phone]  If scheduled at [facility], please arrive at the [facility] main entrance of [facility] 30 minutes prior to test start time. Proceed to the [facility] Radiology Department (first floor) to check-in and test prep.  If scheduled at [facility], please arrive 15 mins early for check-in and test prep.  Please follow these instructions carefully (unless otherwise directed):   On the Night Before the Test: . Be sure to Drink plenty of water. . Do not consume any caffeinated/decaffeinated beverages or chocolate 12 hours prior to your test. . Do not take any antihistamines 12 hours prior to your test.   On the Day of the Test: . Drink plenty of water. Do not drink any water within one hour of the test. . Do not eat any food 4 hours prior to the test. . You may take your regular medications prior to the test.  . Take metoprolol 50 (Lopressor) two hours prior to test. . HOLD Hydrochlorothiazide 25 mg morning of the test. . FEMALES- please wear underwire-free bra if available       After the Test: . Drink plenty of water. . After receiving IV contrast,  you may experience a mild flushed feeling. This is normal. . On occasion, you may experience a mild rash up to 24 hours after the test. This is not dangerous. If this occurs, you can take Benadryl 25 mg and increase your fluid intake. . If you experience trouble breathing, this can be serious. If it is severe call 911 IMMEDIATELY. If it is mild, please call our office. . If you take any of these medications: Glipizide/Metformin, Avandament, Glucavance, please do not take 48 hours after completing test unless  otherwise instructed.   Once we have confirmed authorization from your insurance company, we will call you to set up a date and time for your test.   For non-scheduling related questions, please contact the cardiac imaging nurse navigator should you have any questions/concerns: Marchia Bond, RN Navigator Cardiac Imaging [facility] [phone] office  For scheduling needs, including cancellations and rescheduling, please call [phone].

## 2020-02-14 ENCOUNTER — Telehealth: Payer: Self-pay | Admitting: Cardiology

## 2020-02-14 NOTE — Telephone Encounter (Signed)
   Patient had a message from her pharmacy stating that they are waiting for prescriber approval for her rosuvastatin (CRESTOR) 5 MG tablet. She has not been able to get the medication filled as of yet. She is following up.

## 2020-02-14 NOTE — Telephone Encounter (Signed)
Spoke with pt who state she received a text message from [place] stating they are awaiting prescribers approval before they can fill medication. Nurse informed pt that we have not received a requesting for additional information from the pharmacy. Pt state she will reach to pharmacy and will call back.

## 2020-02-16 NOTE — Telephone Encounter (Signed)
Spoke with pt who voiced she was able to pick up new prescription.

## 2020-03-07 ENCOUNTER — Telehealth (HOSPITAL_COMMUNITY): Payer: Self-pay | Admitting: *Deleted

## 2020-03-07 NOTE — Telephone Encounter (Signed)
Attempted to call patient regarding upcoming cardiac CT appointment. Left message on voicemail with name and callback number  Merle Tai RN Navigator Cardiac Imaging [facility] [phone] Office [phone] Cell 

## 2020-03-07 NOTE — Telephone Encounter (Signed)
Returning pt's call regarding cardiac CT instructions.  Instructions reviewed and pt expressed understanding.  Pt encourage to utilized the [place] parking.

## 2020-03-08 ENCOUNTER — Other Ambulatory Visit: Payer: Self-pay | Admitting: Cardiology

## 2020-03-08 ENCOUNTER — Encounter (HOSPITAL_COMMUNITY): Payer: Self-pay

## 2020-03-08 ENCOUNTER — Other Ambulatory Visit: Payer: Self-pay

## 2020-03-08 ENCOUNTER — Ambulatory Visit (HOSPITAL_COMMUNITY)
Admission: RE | Admit: 2020-03-08 | Discharge: 2020-03-08 | Disposition: A | Discharge status: Home / Self Care | Payer: BC Managed Care – PPO | Source: Ambulatory Visit | Attending: Cardiology | Admitting: Cardiology

## 2020-03-08 DIAGNOSIS — R072 Precordial pain: Secondary | ICD-10-CM | POA: Diagnosis not present

## 2020-03-08 DIAGNOSIS — E7800 Pure hypercholesterolemia, unspecified: Secondary | ICD-10-CM

## 2020-03-08 LAB — BASIC METABOLIC PANEL
BUN/Creatinine Ratio: 13 (ref 12–28)
BUN: 10 mg/dL (ref 8–27)
CO2: 25 mmol/L (ref 20–29)
Calcium: 9.6 mg/dL (ref 8.7–10.3)
Chloride: 102 mmol/L (ref 96–106)
Creatinine, Ser: 0.75 mg/dL (ref 0.57–1.00)
GFR calc Af Amer: 96 mL/min/{1.73_m2} (ref >59)
GFR calc non Af Amer: 83 mL/min/{1.73_m2} (ref >59)
Glucose: 74 mg/dL (ref 65–99)
Potassium: 4.5 mmol/L (ref 3.5–5.2)
Sodium: 141 mmol/L (ref 134–144)

## 2020-03-08 MED ORDER — NITROGLYCERIN 0.4 MG SL SUBL
SUBLINGUAL_TABLET | SUBLINGUAL | Status: AC
  Filled 2020-03-08: qty 2

## 2020-03-08 MED ORDER — NITROGLYCERIN 0.4 MG SL SUBL
0.8000 mg | SUBLINGUAL_TABLET | Freq: Once | SUBLINGUAL | Status: AC
  Administered 2020-03-08: 0.8 mg via SUBLINGUAL

## 2020-03-08 MED ORDER — IOHEXOL 350 MG/ML SOLN
100.0000 mL | Freq: Once | INTRAVENOUS | Status: AC | PRN
  Administered 2020-03-08: 100 mL via INTRAVENOUS

## 2020-03-09 ENCOUNTER — Other Ambulatory Visit: Payer: Self-pay | Admitting: Cardiology

## 2020-03-09 NOTE — Telephone Encounter (Signed)
New message      *STAT* If patient is at the pharmacy, call can be transferred to refill team.   1. Which medications need to be refilled? (please list name of each medication and dose if known) rosuvastatin (CRESTOR) 5 MG tablet  2. Which pharmacy/location (including street and city if local pharmacy) is medication to be sent to? [facility] [phone] - [place] [address]  3. Do they need a 30 day or 90 day supply? 32   Pt says pharmacy has not gotten refill request

## 2020-03-10 ENCOUNTER — Telehealth: Payer: Self-pay | Admitting: Cardiology

## 2020-03-10 MED ORDER — ROSUVASTATIN CALCIUM 20 MG PO TABS
20.0000 mg | ORAL_TABLET | Freq: Every day | ORAL | 3 refills | Status: DC
Start: 2020-03-10 — End: 2021-02-07

## 2020-03-10 NOTE — Telephone Encounter (Signed)
Refill order was placed incorrectly for 5mg  rosuvastatin qd. Per Dr. Mariea Clonts last OV note, pt was to work her way up to 20mg  qd. New refill was made for 20mg  qd.

## 2020-03-10 NOTE — Telephone Encounter (Signed)
LVM and sent MyChart message to discuss.

## 2020-03-10 NOTE — Telephone Encounter (Signed)
thanks

## 2020-03-10 NOTE — Telephone Encounter (Signed)
New message   Patient has question about the dosage for rosuvastatin (CRESTOR) 5 MG tablet. Please call to discuss.

## 2020-03-27 ENCOUNTER — Other Ambulatory Visit: Payer: Self-pay

## 2020-03-27 ENCOUNTER — Ambulatory Visit: Payer: BC Managed Care – PPO | Admitting: Cardiology

## 2020-03-27 VITALS — BP 128/47 | HR 84 | Ht 64.0 in | Wt 148.4 lb

## 2020-03-27 DIAGNOSIS — I700 Atherosclerosis of aorta: Secondary | ICD-10-CM | POA: Diagnosis not present

## 2020-03-27 DIAGNOSIS — Z712 Person consulting for explanation of examination or test findings: Secondary | ICD-10-CM

## 2020-03-27 DIAGNOSIS — I2584 Coronary atherosclerosis due to calcified coronary lesion: Secondary | ICD-10-CM

## 2020-03-27 DIAGNOSIS — E7800 Pure hypercholesterolemia, unspecified: Secondary | ICD-10-CM | POA: Diagnosis not present

## 2020-03-27 DIAGNOSIS — I10 Essential (primary) hypertension: Secondary | ICD-10-CM

## 2020-03-27 DIAGNOSIS — Z8249 Family history of ischemic heart disease and other diseases of the circulatory system: Secondary | ICD-10-CM

## 2020-03-27 DIAGNOSIS — I2510 Atherosclerotic heart disease of native coronary artery without angina pectoris: Secondary | ICD-10-CM | POA: Diagnosis not present

## 2020-03-27 DIAGNOSIS — Z7189 Other specified counseling: Secondary | ICD-10-CM

## 2020-03-27 MED ORDER — NITROGLYCERIN 0.4 MG SL SUBL: 0.4000 mg | SUBLINGUAL_TABLET | SUBLINGUAL | 3 refills | Status: AC | PRN

## 2020-03-27 NOTE — Patient Instructions (Addendum)
Medication Instructions:  Take Nitroglycerin -Place 1 tablet (0.4 mg total) under the tongue every 5 (five) minutes as needed for chest pain up to 3 doses  *If you need a refill on your cardiac medications before your next appointment, please call your pharmacy*   Lab Work: None  Testing/Procedures: None   Follow-Up: At [facility], you and your health needs are our priority.  As part of our continuing mission to provide you with exceptional heart care, we have created designated Provider Care Teams.  These Care Teams include your primary Cardiologist (physician) and Advanced Practice Providers (APPs -  Physician Assistants and Nurse Practitioners) who all work together to provide you with the care you need, when you need it.  We recommend signing up for the patient portal called "MyChart".  Sign up information is provided on this After Visit Summary.  MyChart is used to connect with patients for Virtual Visits (Telemedicine).  Patients are able to view lab/test results, encounter notes, upcoming appointments, etc.  Non-urgent messages can be sent to your provider as well.   To learn more about what you can do with MyChart, go to [email or web address].    Your next appointment:   6 month(s)  The format for your next appointment:   In Person  Provider:   Buford Dresser, MD  Nitroglycerin sublingual tablets What is this medicine? NITROGLYCERIN (nye troe GLI ser in) is a type of vasodilator. It relaxes blood vessels, increasing the blood and oxygen supply to your heart. This medicine is used to relieve chest pain caused by angina. It is also used to prevent chest pain before activities like climbing stairs, going outdoors in cold weather, or sexual activity. This medicine may be used for other purposes; ask your health care provider or pharmacist if you have questions. COMMON BRAND NAME(S): Nitroquick, Nitrostat, Nitrotab What should I tell my health care provider before I  take this medicine? They need to know if you have any of these conditions:  anemia  head injury, recent stroke, or bleeding in the brain  liver disease  previous heart attack  an unusual or allergic reaction to nitroglycerin, other medicines, foods, dyes, or preservatives  pregnant or trying to get pregnant  breast-feeding How should I use this medicine? Take this medicine by mouth as needed. At the first sign of an angina attack (chest pain or tightness) place one tablet under your tongue. You can also take this medicine 5 to 10 minutes before an event likely to produce chest pain. Follow the directions on the prescription label. Let the tablet dissolve under the tongue. Do not swallow whole. Replace the dose if you accidentally swallow it. It will help if your mouth is not dry. Saliva around the tablet will help it to dissolve more quickly. Do not eat or drink, smoke or chew tobacco while a tablet is dissolving. If you are not better within 5 minutes after taking ONE dose of nitroglycerin, call 9-1-1 immediately to seek emergency medical care. Do not take more than 3 nitroglycerin tablets over 15 minutes. If you take this medicine often to relieve symptoms of angina, your doctor or health care professional may provide you with different instructions to manage your symptoms. If symptoms do not go away after following these instructions, it is important to call 9-1-1 immediately. Do not take more than 3 nitroglycerin tablets over 15 minutes. Talk to your pediatrician regarding the use of this medicine in children. Special care may be needed. Overdosage: If you think  you have taken too much of this medicine contact a poison control center or emergency room at once. NOTE: This medicine is only for you. Do not share this medicine with others. What if I miss a dose? This does not apply. This medicine is only used as needed. What may interact with this medicine? Do not take this medicine with any  of the following medications:  certain migraine medicines like ergotamine and dihydroergotamine (DHE)  medicines used to treat erectile dysfunction like sildenafil, tadalafil, and vardenafil  riociguat This medicine may also interact with the following medications:  alteplase  aspirin  heparin  medicines for high blood pressure  medicines for mental depression  other medicines used to treat angina  phenothiazines like chlorpromazine, mesoridazine, prochlorperazine, thioridazine This list may not describe all possible interactions. Give your health care provider a list of all the medicines, herbs, non-prescription drugs, or dietary supplements you use. Also tell them if you smoke, drink alcohol, or use illegal drugs. Some items may interact with your medicine. What should I watch for while using this medicine? Tell your doctor or health care professional if you feel your medicine is no longer working. Keep this medicine with you at all times. Sit or lie down when you take your medicine to prevent falling if you feel dizzy or faint after using it. Try to remain calm. This will help you to feel better faster. If you feel dizzy, take several deep breaths and lie down with your feet propped up, or bend forward with your head resting between your knees. You may get drowsy or dizzy. Do not drive, use machinery, or do anything that needs mental alertness until you know how this drug affects you. Do not stand or sit up quickly, especially if you are an older patient. This reduces the risk of dizzy or fainting spells. Alcohol can make you more drowsy and dizzy. Avoid alcoholic drinks. Do not treat yourself for coughs, colds, or pain while you are taking this medicine without asking your doctor or health care professional for advice. Some ingredients may increase your blood pressure. What side effects may I notice from receiving this medicine? Side effects that you should report to your doctor or  health care professional as soon as possible:  blurred vision  dry mouth  skin rash  sweating  the feeling of extreme pressure in the head  unusually weak or tired Side effects that usually do not require medical attention (report to your doctor or health care professional if they continue or are bothersome):  flushing of the face or neck  headache  irregular heartbeat, palpitations  nausea, vomiting This list may not describe all possible side effects. Call your doctor for medical advice about side effects. You may report side effects to FDA at 1-800-FDA-1088. Where should I keep my medicine? Keep out of the reach of children. Store at room temperature between 20 and 25 degrees C (68 and 77 degrees F). Store in [employer]. Protect from light and moisture. Keep tightly closed. Throw away any unused medicine after the expiration date. NOTE: This sheet is a summary. It may not cover all possible information. If you have questions about this medicine, talk to your doctor, pharmacist, or health care provider.  2020 Elsevier/Gold Standard (2013-08-05 17:57:36)

## 2020-03-27 NOTE — Progress Notes (Signed)
Cardiology Office Note:    Date:  03/27/2020   ID:  [name], DOB 01-14-1951, MRN [id number]  PCP:  Eber Hong, MD  Cardiologist:  Buford Dresser, MD  Referring MD: Eber Hong, MD   CC: follow up  History of Present Illness:    [name] is a 69 y.o. female with a hx of hypertension, hyperlipidemia, hypothyroidism, GERD s/p Nissen, chronic pain who is seen for follow up. I initially met her 02/10/20 as a new consult at the request of Eber Hong, MD for the evaluation and management of chest pain and CV risk given prior coronary calcium.  Notes received and reviewed from office visit with Dr. Brynda Greathouse at [facility] on 02/01/2020. There was no specific mention of cardiac testing or concerns at this visit. Labs dated 01/25/20 show normal CMP. Lipids show Tchol 204, TG 118, HDL 62, LDL 118.  I do not see any recent imaging that would evaluate coronary calcium; last imaging noting this was in 2015. I reviewed both our EMR and [facility] via [employer]. Per referral note from 02/03/20, she was due for annual follow up but has not been seen in 3 years, so required new referral. Last seen by Dr. Bronson Ing 2017 and Dr. Harl Bowie in 2015.  Cardiac history: coronary artery calcification on CT 05/2014 (noted as LAD), normal nuclear stress test 2012 (per report). Known history of LBBB. Risk factors: hypertension, hyperlipidemia, secondhand smoke exposure, family history  Today: Under a lot of stress with her job. Reviewed her CT together today. Calcium score 336, mild nonobstructive CAD. Aortic atherosclerosis seen as well. Started on rosuvastatin, tolerating.   Discussed diet, exercise together today. Discussed red flag warning signs, how to use nitro, when to call 911.  Denies chest pain, shortness of breath at rest or with normal exertion. No PND, orthopnea, LE edema or unexpected weight gain. No syncope or palpitations.  Past Medical History:  Diagnosis Date  . Anemia    . Arthritis   . Barrett's esophagus   . Cataract   . Diverticulosis of colon (without mention of hemorrhage)   . Duodenitis 04/12/2011  . GASTRITIS 10/05/2008   Qualifier: Diagnosis of  By: [name], [name]    . GERD (gastroesophageal reflux disease)   . Hiatal hernia   . Hypertension   . Hypothyroidism   . IBS (irritable bowel syndrome)   . Irritable bowel syndrome with diarrhea   . S/P redo revision of Lap Nissen Oct 2015 04/12/2011  . Skin cancer   . Thyroid disease     Past Surgical History:  Procedure Laterality Date  . ABDOMINAL HYSTERECTOMY    . APPENDECTOMY    . COLONOSCOPY  2009   562.10  . EYE SURGERY     eyelid  . FLEXOR TENDON REPAIR Right 11/23/2015   Procedure: RECONSTUCTION FLEXOR DIGITORUM PROFUNDUS RIGHT SMALL FINGER  POLLICIS LONGUS GRAFT ;  Surgeon: Daryll Brod, MD;  Location: [place];  Service: Orthopedics;  Laterality: Right;  . LAPAROSCOPIC NISSEN FUNDOPLICATION N/A 94/04/6545   Procedure: REDO NISSEN FUNDOPLICATION WITH UPPER ENDOSCOPY;  Surgeon: Kaylyn Lim, MD;  Location: [facility];  Service: General;  Laterality: N/A;  . NISSEN FUNDOPLICATION  5035  . PROCTOSCOPY N/A 06/06/2017   Procedure: RIGID PROCTOSCOPY;  Surgeon: Michael Boston, MD;  Location: [facility];  Service: General;  Laterality: N/A;  . skin cancer removal from right thigh     . TMJ ARTHROPLASTY    . TOTAL VAGINAL HYSTERECTOMY    .  TRIGGER FINGER RELEASE      Current Medications: Current Outpatient Medications on File Prior to Visit  Medication Sig  . acetaminophen (TYLENOL) 325 MG tablet Take 325 mg by mouth at bedtime.   Marland Kitchen aspirin 81 MG tablet Take 81 mg by mouth daily.   . Calcium Carbonate-Vitamin D (CALCIUM 600 + D PO) Take 1 tablet by mouth daily at 12 noon.   . Calcium Carbonate-Vitamin D 600-200 MG-UNIT TABS 1 po bid  . diclofenac sodium (VOLTAREN) 1 % GEL Apply 2 g topically 3 (three) times daily as needed (pain).  Marland Kitchen dicyclomine (BENTYL) 20 MG tablet Take 1 tablet  (20 mg total) by mouth 3 (three) times daily as needed for spasms.  . famotidine (PEPCID) 20 MG tablet Take by mouth.  . hydrochlorothiazide (HYDRODIURIL) 25 MG tablet Take 25 mg by mouth daily.   . hydrochlorothiazide (HYDRODIURIL) 25 MG tablet Take by mouth.  . levothyroxine (SYNTHROID) 88 MCG tablet TAKE 1 TABLET BY MOUTH DAILY  . metoprolol tartrate (LOPRESSOR) 50 MG tablet TAKE 1 TABLET 2 HR PRIOR TO CARDIAC PROCEDURE  . Multiple Vitamin (MULTI-VITAMIN) tablet Take by mouth.  . Omega-3 Fatty Acids (EQL OMEGA 3 FISH OIL) 1400 MG CAPS Take 1,400 mg by mouth daily.   . raloxifene (EVISTA) 60 MG tablet TAKE 1 TABLET BY MOUTH EVERY DAY  . rosuvastatin (CRESTOR) 20 MG tablet Take 1 tablet (20 mg total) by mouth daily.  . traMADol (ULTRAM) 50 MG tablet TAKE 1 TO 2 TABLETS BY MOUTH EVERY NIGHT AT BEDTIME AS NEEDED FOR PAIN  . valACYclovir (VALTREX) 1000 MG tablet Take 1,000 mg by mouth daily.    Current Facility-Administered Medications on File Prior to Visit  Medication  . 0.9 %  sodium chloride infusion     Allergies:   Alendronate sodium, Codeine, Ciprofloxacin, and Tetracycline   Social History   Tobacco Use  . Smoking status: Never Smoker  . Smokeless tobacco: Never Used  Substance Use Topics  . Alcohol use: Yes    Comment: social  . Drug use: No    Family History: family history includes Diabetes in her mother; Heart disease in her mother; Hypertension in her father and mother; Throat cancer (age of onset: 100) in her father. There is no history of Colon cancer. mother had hypertension, HF. Maternal grandfather also had HF. Father had PAD and and hypertension. Sister is 48, just had a stroke from high cholesterol.  ROS:   Please see the history of present illness.  Additional pertinent ROS otherwise unremarkable.  EKGs/Labs/Other Studies Reviewed:    The following studies were reviewed today: CT coronary 03/08/20 1.  Mild nonobstructive CAD, CADRADS = 2.  2. Coronary  calcium score of 336. This was 92nd percentile for age and sex matched control.  3. Normal coronary origin with left dominance.  EKG:  EKG is personally reviewed.  The ekg ordered 02/10/20 demonstrates NSR, LBBB  Recent Labs: 03/07/2020: BUN 10; Creatinine, Ser 0.75; Potassium 4.5; Sodium 141  Recent Lipid Panel    Component Value Date/Time   CHOL 215 06/05/2010 0000   TRIG 95 06/05/2010 0000   HDL 58.3 06/05/2010 0000   LDLCALC 137.7 06/05/2010 0000    Physical Exam:    VS:  BP (!) 128/47   Pulse 84   Ht 5' 4" (1.626 m)   Wt 148 lb 6.4 oz (67.3 kg)   SpO2 96%   BMI 25.47 kg/m     Wt Readings from Last 3 Encounters:  03/27/20 148 lb 6.4 oz (67.3 kg)  02/10/20 146 lb (66.2 kg)  07/19/19 147 lb 3.2 oz (66.8 kg)    GEN: Well nourished, well developed in no acute distress HEENT: Normal, moist mucous membranes NECK: No JVD CARDIAC: regular rhythm, normal S1 and S2, no rubs or gallops. No murmur. VASCULAR: Radial and DP pulses 2+ bilaterally. No carotid bruits RESPIRATORY:  Clear to auscultation without rales, wheezing or rhonchi  ABDOMEN: Soft, non-tender, non-distended MUSCULOSKELETAL:  Ambulates independently SKIN: Warm and dry, no edema NEUROLOGIC:  Alert and oriented x 3. No focal neuro deficits noted. PSYCHIATRIC:  Normal affect   ASSESSMENT:    1. Coronary artery disease due to calcified coronary lesion   2. Aortic atherosclerosis ([place])   3. Hypercholesteremia   4. Essential hypertension   5. Family history of heart disease   6. Cardiac risk counseling   7. Counseling on health promotion and disease prevention   8. Encounter to discuss test results    PLAN:    CT coronary with evidence of CAD due to calcified coronary lesion, aortic atherosclerosis: reviewed CT results together today -counseled on red flag warning signs that need immediate medical attention -continue rosuvastatin 20 mg daily -continue aspirin 81 mg -given nitroglycerin sublingual,  instructed on use -risk factors: family history of heart disease, hypertension, hyperlipidemia  Hypercholesterolemia: -continue rosuvastatin 20 mg daily -recheck lipids at follow up  Hypertension: at goal today -continue HCTZ  Cardiac risk counseling and prevention recommendations: -recommend heart healthy/Mediterranean diet, with whole grains, fruits, vegetable, fish, lean meats, nuts, and olive oil. Limit salt. -recommend moderate walking, 3-5 times/week for 30-50 minutes each session. Aim for at least 150 minutes.week. Goal should be pace of 3 miles/hours, or walking 1.5 miles in 30 minutes -recommend avoidance of tobacco products. Avoid excess alcohol. -Additional risk factor control:  -Diabetes risk: A1c is 5.3 in 2018, denies history  -Lipids: as above  -Blood pressure control: as above  -Weight: BMI 25  Plan for follow up: 6 mos or sooner as needed  Buford Dresser, MD, PhD [place]  [facility]    Medication Adjustments/Labs and Tests Ordered: Current medicines are reviewed at length with the patient today.  Concerns regarding medicines are outlined above.  No orders of the defined types were placed in this encounter.  Meds ordered this encounter  Medications  . nitroGLYCERIN (NITROSTAT) 0.4 MG SL tablet    Sig: Place 1 tablet (0.4 mg total) under the tongue every 5 (five) minutes as needed for chest pain.    Dispense:  90 tablet    Refill:  3    Patient Instructions  Medication Instructions:  Take Nitroglycerin -Place 1 tablet (0.4 mg total) under the tongue every 5 (five) minutes as needed for chest pain up to 3 doses  *If you need a refill on your cardiac medications before your next appointment, please call your pharmacy*   Lab Work: None  Testing/Procedures: None   Follow-Up: At [facility], you and your health needs are our priority.  As part of our continuing mission to provide you with exceptional heart care, we have created  designated Provider Care Teams.  These Care Teams include your primary Cardiologist (physician) and Advanced Practice Providers (APPs -  Physician Assistants and Nurse Practitioners) who all work together to provide you with the care you need, when you need it.  We recommend signing up for the patient portal called "MyChart".  Sign up information is provided on this After Visit Summary.  MyChart is used to connect with patients for Virtual Visits (Telemedicine).  Patients are able to view lab/test results, encounter notes, upcoming appointments, etc.  Non-urgent messages can be sent to your provider as well.   To learn more about what you can do with MyChart, go to [email or web address].    Your next appointment:   6 month(s)  The format for your next appointment:   In Person  Provider:   Buford Dresser, MD  Nitroglycerin sublingual tablets What is this medicine? NITROGLYCERIN (nye troe GLI ser in) is a type of vasodilator. It relaxes blood vessels, increasing the blood and oxygen supply to your heart. This medicine is used to relieve chest pain caused by angina. It is also used to prevent chest pain before activities like climbing stairs, going outdoors in cold weather, or sexual activity. This medicine may be used for other purposes; ask your health care provider or pharmacist if you have questions. COMMON BRAND NAME(S): Nitroquick, Nitrostat, Nitrotab What should I tell my health care provider before I take this medicine? They need to know if you have any of these conditions:  anemia  head injury, recent stroke, or bleeding in the brain  liver disease  previous heart attack  an unusual or allergic reaction to nitroglycerin, other medicines, foods, dyes, or preservatives  pregnant or trying to get pregnant  breast-feeding How should I use this medicine? Take this medicine by mouth as needed. At the first sign of an angina attack (chest pain or tightness) place one  tablet under your tongue. You can also take this medicine 5 to 10 minutes before an event likely to produce chest pain. Follow the directions on the prescription label. Let the tablet dissolve under the tongue. Do not swallow whole. Replace the dose if you accidentally swallow it. It will help if your mouth is not dry. Saliva around the tablet will help it to dissolve more quickly. Do not eat or drink, smoke or chew tobacco while a tablet is dissolving. If you are not better within 5 minutes after taking ONE dose of nitroglycerin, call 9-1-1 immediately to seek emergency medical care. Do not take more than 3 nitroglycerin tablets over 15 minutes. If you take this medicine often to relieve symptoms of angina, your doctor or health care professional may provide you with different instructions to manage your symptoms. If symptoms do not go away after following these instructions, it is important to call 9-1-1 immediately. Do not take more than 3 nitroglycerin tablets over 15 minutes. Talk to your pediatrician regarding the use of this medicine in children. Special care may be needed. Overdosage: If you think you have taken too much of this medicine contact a poison control center or emergency room at once. NOTE: This medicine is only for you. Do not share this medicine with others. What if I miss a dose? This does not apply. This medicine is only used as needed. What may interact with this medicine? Do not take this medicine with any of the following medications:  certain migraine medicines like ergotamine and dihydroergotamine (DHE)  medicines used to treat erectile dysfunction like sildenafil, tadalafil, and vardenafil  riociguat This medicine may also interact with the following medications:  alteplase  aspirin  heparin  medicines for high blood pressure  medicines for mental depression  other medicines used to treat angina  phenothiazines like chlorpromazine, mesoridazine,  prochlorperazine, thioridazine This list may not describe all possible interactions. Give your health care provider a list of all the  medicines, herbs, non-prescription drugs, or dietary supplements you use. Also tell them if you smoke, drink alcohol, or use illegal drugs. Some items may interact with your medicine. What should I watch for while using this medicine? Tell your doctor or health care professional if you feel your medicine is no longer working. Keep this medicine with you at all times. Sit or lie down when you take your medicine to prevent falling if you feel dizzy or faint after using it. Try to remain calm. This will help you to feel better faster. If you feel dizzy, take several deep breaths and lie down with your feet propped up, or bend forward with your head resting between your knees. You may get drowsy or dizzy. Do not drive, use machinery, or do anything that needs mental alertness until you know how this drug affects you. Do not stand or sit up quickly, especially if you are an older patient. This reduces the risk of dizzy or fainting spells. Alcohol can make you more drowsy and dizzy. Avoid alcoholic drinks. Do not treat yourself for coughs, colds, or pain while you are taking this medicine without asking your doctor or health care professional for advice. Some ingredients may increase your blood pressure. What side effects may I notice from receiving this medicine? Side effects that you should report to your doctor or health care professional as soon as possible:  blurred vision  dry mouth  skin rash  sweating  the feeling of extreme pressure in the head  unusually weak or tired Side effects that usually do not require medical attention (report to your doctor or health care professional if they continue or are bothersome):  flushing of the face or neck  headache  irregular heartbeat, palpitations  nausea, vomiting This list may not describe all possible side  effects. Call your doctor for medical advice about side effects. You may report side effects to FDA at 1-800-FDA-1088. Where should I keep my medicine? Keep out of the reach of children. Store at room temperature between 20 and 25 degrees C (68 and 77 degrees F). Store in [employer]. Protect from light and moisture. Keep tightly closed. Throw away any unused medicine after the expiration date. NOTE: This sheet is a summary. It may not cover all possible information. If you have questions about this medicine, talk to your doctor, pharmacist, or health care provider.  2020 Elsevier/Gold Standard (2013-08-05 17:57:36)       Signed, Buford Dresser, MD PhD 03/27/2020  [facility]

## 2020-05-15 ENCOUNTER — Encounter: Payer: Self-pay | Admitting: Internal Medicine

## 2020-05-18 ENCOUNTER — Other Ambulatory Visit: Payer: Self-pay | Admitting: Cardiology

## 2020-05-18 DIAGNOSIS — E7800 Pure hypercholesterolemia, unspecified: Secondary | ICD-10-CM

## 2020-05-31 ENCOUNTER — Other Ambulatory Visit: Payer: Self-pay | Admitting: Internal Medicine

## 2020-05-31 DIAGNOSIS — Z1231 Encounter for screening mammogram for malignant neoplasm of breast: Secondary | ICD-10-CM

## 2020-06-06 ENCOUNTER — Other Ambulatory Visit: Payer: Self-pay

## 2020-06-06 ENCOUNTER — Ambulatory Visit (AMBULATORY_SURGERY_CENTER): Payer: Self-pay | Admitting: *Deleted

## 2020-06-06 VITALS — Ht 64.0 in | Wt 148.0 lb

## 2020-06-06 DIAGNOSIS — K22719 Barrett's esophagus with dysplasia, unspecified: Secondary | ICD-10-CM

## 2020-06-06 NOTE — Progress Notes (Signed)

## 2020-06-07 ENCOUNTER — Encounter: Payer: Self-pay | Admitting: Internal Medicine

## 2020-06-13 ENCOUNTER — Encounter: Payer: Self-pay | Admitting: Cardiology

## 2020-06-30 ENCOUNTER — Other Ambulatory Visit: Payer: Self-pay

## 2020-06-30 ENCOUNTER — Encounter: Payer: Self-pay | Admitting: Internal Medicine

## 2020-06-30 ENCOUNTER — Ambulatory Visit (AMBULATORY_SURGERY_CENTER): Payer: BC Managed Care – PPO | Admitting: Internal Medicine

## 2020-06-30 VITALS — BP 122/71 | HR 73 | Temp 97.4°F | Resp 19 | Ht 64.0 in | Wt 148.0 lb

## 2020-06-30 DIAGNOSIS — D649 Anemia, unspecified: Secondary | ICD-10-CM

## 2020-06-30 DIAGNOSIS — K2270 Barrett's esophagus without dysplasia: Secondary | ICD-10-CM | POA: Diagnosis present

## 2020-06-30 MED ORDER — SODIUM CHLORIDE 0.9 % IV SOLN: 500.0000 mL | INTRAVENOUS | Status: DC

## 2020-06-30 NOTE — Op Note (Signed)
[place] Patient Name: [name] Procedure Date: 06/30/2020 10:16 AM MRN: [id number] Endoscopist: Jerene Bears , MD Age: 69 Referring MD:  Date of Birth: Mar 01, 1951 Gender: Female Account #: [id number] Procedure:                Upper GI endoscopy Indications:              Barrett's esophagus last EGD June 2018 Medicines:                Monitored Anesthesia Care Procedure:                Pre-Anesthesia Assessment:                           - Prior to the procedure, a History and Physical                            was performed, and patient medications and                            allergies were reviewed. [name] tolerance of                            previous anesthesia was also reviewed. The risks                            and benefits of the procedure and the sedation                            options and risks were discussed with the patient.                            All questions were answered, and informed consent                            was obtained. Prior Anticoagulants: The patient has                            taken no previous anticoagulant or antiplatelet                            agents. ASA Grade Assessment: III - A patient with                            severe systemic disease. After reviewing the risks                            and benefits, the patient was deemed in                            satisfactory condition to undergo the procedure.                           After obtaining informed consent, the endoscope was  passed under direct vision. Throughout the                            procedure, the patient's blood pressure, pulse, and                            oxygen saturations were monitored continuously. The                            Endoscope was introduced through the mouth, and                            advanced to the second part of duodenum. The upper                            GI endoscopy was  accomplished without difficulty.                            The patient tolerated the procedure well. Scope In: Scope Out: Findings:                 The Z-line was slightly irregular and was found 39                            cm from the incisors. There was 1 tiny island of                            salmon colored mucosa 0.5 cm proximal to the                            Z-line. This area was biopsied with a cold forceps                            for evaluation to rule out Barrett's Esophagus.                           Evidence of a Nissen fundoplication was found in                            the cardia. The wrap appeared intact.                           The entire examined stomach was normal.                           The examined duodenum was normal. Complications:            No immediate complications. Estimated Blood Loss:     Estimated blood loss was minimal. Impression:               - Z-line irregular, 39 cm from the incisors.                            Biopsied.                           -  A Nissen fundoplication was found. The wrap                            appears intact and healthy.                           - Normal stomach.                           - Normal examined duodenum. Recommendation:           - Patient has a contact number available for                            emergencies. The signs and symptoms of potential                            delayed complications were discussed with the                            patient. Return to normal activities tomorrow.                            Written discharge instructions were provided to the                            patient.                           - Resume previous diet.                           - Continue present medications.                           - Await pathology results.                           - Repeat upper endoscopy may be recommended after                            studies are complete for  surveillance of Barrett's                            esophagus. Jerene Bears, MD 06/30/2020 10:39:03 AM This report has been signed electronically.

## 2020-06-30 NOTE — Patient Instructions (Signed)
° °  Await biopsy results  ° ° ° °YOU HAD AN ENDOSCOPIC PROCEDURE TODAY AT THE [facility]:   Refer to the procedure report that was given to you for any specific questions about what was found during the examination.  If the procedure report does not answer your questions, please call your gastroenterologist to clarify.  If you requested that your care partner not be given the details of your procedure findings, then the procedure report has been included in a sealed envelope for you to review at your convenience later. ° °YOU SHOULD EXPECT: Some feelings of bloating in the abdomen. Passage of more gas than usual.  Walking can help get rid of the air that was put into your GI tract during the procedure and reduce the bloating. If you had a lower endoscopy (such as a colonoscopy or flexible sigmoidoscopy) you may notice spotting of blood in your stool or on the toilet paper. If you underwent a bowel prep for your procedure, you may not have a normal bowel movement for a few days. ° °Please Note:  You might notice some irritation and congestion in your nose or some drainage.  This is from the oxygen used during your procedure.  There is no need for concern and it should clear up in a day or so. ° °SYMPTOMS TO REPORT IMMEDIATELY: ° ° °Following upper endoscopy (EGD) ° Vomiting of blood or coffee ground material ° New chest pain or pain under the shoulder blades ° Painful or persistently difficult swallowing ° New shortness of breath ° Fever of 100°F or higher ° Black, tarry-looking stools ° °For urgent or emergent issues, a gastroenterologist can be reached at any hour by calling [phone]. °Do not use MyChart messaging for urgent concerns.  ° ° °DIET:  We do recommend a small meal at first, but then you may proceed to your regular diet.  Drink plenty of fluids but you should avoid alcoholic beverages for 24 hours. ° °ACTIVITY:  You should plan to take it easy for the rest of today and you should NOT  DRIVE or use heavy machinery until tomorrow (because of the sedation medicines used during the test).   ° °FOLLOW UP: °Our staff will call the number listed on your records 48-72 hours following your procedure to check on you and address any questions or concerns that you may have regarding the information given to you following your procedure. If we do not reach you, we will leave a message.  We will attempt to reach you two times.  During this call, we will ask if you have developed any symptoms of COVID 19. If you develop any symptoms (ie: fever, flu-like symptoms, shortness of breath, cough etc.) before then, please call [phone].  If you test positive for Covid 19 in the 2 weeks post procedure, please call and report this information to us.   ° °If any biopsies were taken you will be contacted by phone or by letter within the next 1-3 weeks.  Please call us at [phone] if you have not heard about the biopsies in 3 weeks.  ° ° °SIGNATURES/CONFIDENTIALITY: °You and/or your care partner have signed paperwork which will be entered into your electronic medical record.  These signatures attest to the fact that that the information above on your After Visit Summary has been reviewed and is understood.  Full responsibility of the confidentiality of this discharge information lies with you and/or your care-partner.  °

## 2020-06-30 NOTE — Progress Notes (Signed)
Report given to PACU, vss 

## 2020-06-30 NOTE — Progress Notes (Signed)
1023 Robinul 0.1 mg IV given due large amount of secretions upon assessment.  MD made aware, vss 

## 2020-06-30 NOTE — Progress Notes (Signed)
Called to room to assist during endoscopic procedure.  Patient ID and intended procedure confirmed with present staff. Received instructions for my participation in the procedure from the performing physician.  

## 2020-06-30 NOTE — Progress Notes (Signed)
vss CW Pt's states no medical or surgical changes since previsit or office visit.

## 2020-07-03 ENCOUNTER — Ambulatory Visit
Admission: RE | Admit: 2020-07-03 | Discharge: 2020-07-03 | Disposition: A | Discharge status: Home / Self Care | Payer: BC Managed Care – PPO | Source: Ambulatory Visit | Attending: Internal Medicine | Admitting: Internal Medicine

## 2020-07-03 DIAGNOSIS — Z1231 Encounter for screening mammogram for malignant neoplasm of breast: Secondary | ICD-10-CM

## 2020-07-04 ENCOUNTER — Telehealth: Payer: Self-pay

## 2020-07-04 ENCOUNTER — Telehealth: Payer: Self-pay | Admitting: *Deleted

## 2020-07-04 NOTE — Telephone Encounter (Signed)
  Follow up Call-  Call back number 06/30/2020  Post procedure Call Back phone  # [phone]  Permission to leave phone message Yes  Some recent data might be hidden     Patient questions:  Message left to call us if necessary.

## 2020-07-04 NOTE — Telephone Encounter (Signed)
Second follow up attempt, no answer, LM

## 2020-07-05 ENCOUNTER — Encounter: Payer: Self-pay | Admitting: Internal Medicine

## 2020-08-07 ENCOUNTER — Encounter: Payer: Self-pay | Admitting: Dermatology

## 2020-08-07 ENCOUNTER — Other Ambulatory Visit: Payer: Self-pay

## 2020-08-07 ENCOUNTER — Encounter: Payer: Self-pay | Admitting: *Deleted

## 2020-08-07 ENCOUNTER — Ambulatory Visit: Payer: BC Managed Care – PPO | Admitting: Dermatology

## 2020-08-07 DIAGNOSIS — L570 Actinic keratosis: Secondary | ICD-10-CM | POA: Diagnosis not present

## 2020-08-07 DIAGNOSIS — Z85828 Personal history of other malignant neoplasm of skin: Secondary | ICD-10-CM

## 2020-08-07 DIAGNOSIS — Z8589 Personal history of malignant neoplasm of other organs and systems: Secondary | ICD-10-CM

## 2020-08-07 DIAGNOSIS — Z86018 Personal history of other benign neoplasm: Secondary | ICD-10-CM

## 2020-08-07 DIAGNOSIS — Z87898 Personal history of other specified conditions: Secondary | ICD-10-CM

## 2020-08-07 DIAGNOSIS — L820 Inflamed seborrheic keratosis: Secondary | ICD-10-CM | POA: Diagnosis not present

## 2020-08-07 DIAGNOSIS — Z85820 Personal history of malignant melanoma of skin: Secondary | ICD-10-CM | POA: Diagnosis not present

## 2020-08-07 DIAGNOSIS — Z1283 Encounter for screening for malignant neoplasm of skin: Secondary | ICD-10-CM

## 2020-08-07 NOTE — Patient Instructions (Signed)
Routine follow-up for [name] date of birth [birth year].  There is no local or distant sign of recurrent melanoma on the right thigh and no atypical pigmented lesions from her feet to her scalp.  Solar keratoses on the right inner brow, right upper scapula, and each forearm were treated with 5-second liquid nitrogen freeze.  It is normal for these to swell and they may peel.  There is no special care.  [name] has multiple benign keratoses including subtle lesions on the left sideburn and larger lesions particularly on her torso, left side area.  These are intrinsically benign and unless she insists on treating these are there is significant clinical change, they do not require removal.  I have encouraged [name] to look at her own skin twice annually forever and we will continue to check her once a year.

## 2020-08-10 NOTE — Progress Notes (Signed)
   Follow-Up Visit   Subjective  [name] is a 69 y.o. female who presents for the following: Annual Exam (NO CONCERNS).  Annual skin exam  Location:  Duration:  Quality:  Associated Signs/Symptoms: Modifying Factors:  Severity:  Timing: Context:   Objective  Well appearing patient in no apparent distress; mood and affect are within normal limits.  A full examination was performed including scalp, head, eyes, ears, nose, lips, neck, chest, axillae, abdomen, back, buttocks, bilateral upper extremities, bilateral lower extremities, hands, feet, fingers, toes, fingernails, and toenails. All findings within normal limits unless otherwise noted below.   Assessment & Plan     Routine follow-up for [name] date of birth 04/05/1951.  There is no local or distant sign of recurrent melanoma on the right thigh and no atypical pigmented lesions from her feet to her scalp.  Solar keratoses on the right inner brow, right upper scapula, and each forearm were treated with 5-second liquid nitrogen freeze.  It is normal for these to swell and they may peel.  There is no special care.  [name] has multiple benign keratoses including subtle lesions on the left sideburn and larger lesions particularly on her torso, left side area.  These are intrinsically benign and unless she insists on treating these are there is significant clinical change, they do not require removal.  I have encouraged [name] to look at her own skin twice annually forever and we will continue to check her once a year.      History of atypical nevus Mid Back  No treatment needed.   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 No treatment needed.  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   Screening for malignant neoplasm of skin Mid Back  Patient will follow up with annual skin exam.   Inflamed  seborrheic keratosis (2) Right Forearm - Anterior; Left Forearm - Posterior  5 second freeze with liquid nitrogen.   Destruction of lesion - Left Forearm - Posterior, Right Forearm - Anterior Complexity: simple   Destruction method: cryotherapy   Informed consent: discussed and consent obtained   Timeout:  patient name, date of birth, surgical site, and procedure verified Lesion destroyed using liquid nitrogen: Yes   Cryotherapy cycles:  5 Outcome: patient tolerated procedure well with no complications   Post-procedure details: wound care instructions given    AK (actinic keratosis) (2) Right Upper Back; Right Forehead  5 second freeze with liquid nitrogen.  Destruction of lesion - Right Forehead, Right Upper Back Complexity: simple   Destruction method: cryotherapy   Informed consent: discussed and consent obtained   Timeout:  patient name, date of birth, surgical site, and procedure verified Lesion destroyed using liquid nitrogen: Yes   Cryotherapy cycles:  5 Outcome: patient tolerated procedure well with no complications   Post-procedure details: wound care instructions given        I, Lavonna Monarch, MD, have reviewed all documentation for this visit.  The documentation on 08/10/20 for the exam, diagnosis, procedures, and orders are all accurate and complete.

## 2020-09-15 ENCOUNTER — Encounter: Payer: Self-pay | Admitting: Dermatology

## 2020-09-26 ENCOUNTER — Ambulatory Visit: Payer: BC Managed Care – PPO | Admitting: Cardiology

## 2020-09-27 ENCOUNTER — Ambulatory Visit: Payer: BC Managed Care – PPO | Admitting: Cardiology

## 2020-09-27 ENCOUNTER — Encounter: Payer: Self-pay | Admitting: Cardiology

## 2020-09-27 ENCOUNTER — Other Ambulatory Visit: Payer: Self-pay

## 2020-09-27 VITALS — BP 122/73 | HR 80 | Temp 97.2°F | Ht 63.0 in | Wt 148.8 lb

## 2020-09-27 DIAGNOSIS — E7800 Pure hypercholesterolemia, unspecified: Secondary | ICD-10-CM | POA: Diagnosis not present

## 2020-09-27 DIAGNOSIS — I700 Atherosclerosis of aorta: Secondary | ICD-10-CM

## 2020-09-27 DIAGNOSIS — I10 Essential (primary) hypertension: Secondary | ICD-10-CM

## 2020-09-27 DIAGNOSIS — Z79899 Other long term (current) drug therapy: Secondary | ICD-10-CM

## 2020-09-27 DIAGNOSIS — I2510 Atherosclerotic heart disease of native coronary artery without angina pectoris: Secondary | ICD-10-CM

## 2020-09-27 DIAGNOSIS — Z7189 Other specified counseling: Secondary | ICD-10-CM

## 2020-09-27 NOTE — Patient Instructions (Signed)
Medication Instructions:  Your Physician recommend you continue on your current medication as directed.    *If you need a refill on your cardiac medications before your next appointment, please call your pharmacy*   Lab Work: Your physician recommends lab work today (Lipid, LFT).  If you have labs (blood work) drawn today and your tests are completely normal, you will receive your results only by: Marland Kitchen MyChart Message (if you have MyChart) OR . A paper copy in the mail If you have any lab test that is abnormal or we need to change your treatment, we will call you to review the results.   Testing/Procedures: None   Follow-Up: At [facility], you and your health needs are our priority.  As part of our continuing mission to provide you with exceptional heart care, we have created designated Provider Care Teams.  These Care Teams include your primary Cardiologist (physician) and Advanced Practice Providers (APPs -  Physician Assistants and Nurse Practitioners) who all work together to provide you with the care you need, when you need it.  We recommend signing up for the patient portal called "MyChart".  Sign up information is provided on this After Visit Summary.  MyChart is used to connect with patients for Virtual Visits (Telemedicine).  Patients are able to view lab/test results, encounter notes, upcoming appointments, etc.  Non-urgent messages can be sent to your provider as well.   To learn more about what you can do with MyChart, go to [email or web address].    Your next appointment:   1 year(s)  The format for your next appointment:   In Person  Provider:   Buford Dresser, MD

## 2020-09-27 NOTE — Progress Notes (Signed)
Cardiology Office Note:    Date:  09/27/2020   ID:  [name], DOB 10-04-1951, MRN [id number]  PCP:  Eber Hong, MD  Cardiologist:  Buford Dresser, MD  Referring MD: Eber Hong, MD   CC: follow up  History of Present Illness:    [name] is a 69 y.o. female with a hx of hypertension, hyperlipidemia, hypothyroidism, GERD s/p Nissen, chronic pain who is seen for follow up. I initially met her 02/10/20 as a new consult at the request of Eber Hong, MD for the evaluation and management of chest pain and CV risk given prior coronary calcium.  Cardiac history: coronary artery calcification on CT 05/2014 (noted as LAD), normal nuclear stress test 2012 (per report). Known history of LBBB. Risk factors: hypertension, hyperlipidemia, secondhand smoke exposure, family history  Today: Doing well overall. No episodes of chest pain. Able to walk 15 minutes at a time, several times/day. Avoids stairs due to her knees. Diet unchanged, not a big eater but eats well overall, overall weakness is sweets. Tolerating medications well. No side effects. Blood pressure well controlled. ROS positive for cramps and pain in the legs at night, both legs, nearly very night. Goes from the arch of her foot up around the ankle. Never exertional.   Denies chest pain, shortness of breath at rest or with normal exertion. No PND, orthopnea, LE edema or unexpected weight gain. No syncope or palpitations.  Past Medical History:  Diagnosis Date  . Anemia   . Arthritis   . Atypical mole 06/10/2000   mild-left flank  . Atypical mole 09/26/2015   mild-right post back  . Barrett's esophagus   . Cataract   . Diverticulosis of colon (without mention of hemorrhage)   . Duodenitis 04/12/2011  . GASTRITIS 10/05/2008   Qualifier: Diagnosis of  By: Heloise Ochoa, Manuela Schwartz    . GERD (gastroesophageal reflux disease)   . Hiatal hernia   . Hyperlipidemia   . Hypertension   . Hypothyroidism   . IBS (irritable bowel  syndrome)   . Irritable bowel syndrome with diarrhea   . Melanoma ([place]) 08/29/012   in situ-right thigh ([facility])  . S/P redo revision of Lap Nissen Oct 2015 04/12/2011  . Skin cancer   . Squamous cell carcinoma of skin 02/17/2008   KA-right leg (IL 5FU)  . Thyroid disease     Past Surgical History:  Procedure Laterality Date  . ABDOMINAL HYSTERECTOMY    . APPENDECTOMY    . COLONOSCOPY  2009   562.10  . EYE SURGERY     eyelid  . FLEXOR TENDON REPAIR Right 11/23/2015   Procedure: RECONSTUCTION FLEXOR DIGITORUM PROFUNDUS RIGHT SMALL FINGER  POLLICIS LONGUS GRAFT ;  Surgeon: Daryll Brod, MD;  Location: [facility];  Service: Orthopedics;  Laterality: Right;  . LAPAROSCOPIC NISSEN FUNDOPLICATION N/A 89/11/1192   Procedure: REDO NISSEN FUNDOPLICATION WITH UPPER ENDOSCOPY;  Surgeon: Kaylyn Lim, MD;  Location: [facility];  Service: General;  Laterality: N/A;  . NISSEN FUNDOPLICATION  1740  . PROCTOSCOPY N/A 06/06/2017   Procedure: RIGID PROCTOSCOPY;  Surgeon: Michael Boston, MD;  Location: [facility];  Service: General;  Laterality: N/A;  . skin cancer removal from right thigh     . TMJ ARTHROPLASTY    . TOTAL VAGINAL HYSTERECTOMY    . TRIGGER FINGER RELEASE      Current Medications: Current Outpatient Medications on File Prior to Visit  Medication Sig  . acetaminophen (TYLENOL) 325 MG tablet Take  325 mg by mouth at bedtime.   Marland Kitchen aspirin 81 MG tablet Take 81 mg by mouth daily.   . Calcium Carbonate-Vitamin D (CALCIUM 600 + D PO) Take 1 tablet by mouth daily at 12 noon.   . diclofenac sodium (VOLTAREN) 1 % GEL Apply 2 g topically 3 (three) times daily as needed (pain).   . famotidine (PEPCID) 20 MG tablet Take by mouth.  . hydrochlorothiazide (HYDRODIURIL) 25 MG tablet Take by mouth.  . levothyroxine (SYNTHROID) 88 MCG tablet TAKE 1 TABLET BY MOUTH DAILY  . Multiple Vitamin (MULTI-VITAMIN) tablet Take by mouth.  . Omega-3 Fatty Acids (EQL OMEGA 3 FISH OIL) 1400 MG CAPS Take 1,400 mg by  mouth daily.   . raloxifene (EVISTA) 60 MG tablet TAKE 1 TABLET BY MOUTH EVERY DAY  . traMADol (ULTRAM) 50 MG tablet TAKE 1 TO 2 TABLETS BY MOUTH EVERY NIGHT AT BEDTIME AS NEEDED FOR PAIN  . valACYclovir (VALTREX) 1000 MG tablet Take 1,000 mg by mouth daily.   . nitroGLYCERIN (NITROSTAT) 0.4 MG SL tablet Place 1 tablet (0.4 mg total) under the tongue every 5 (five) minutes as needed for chest pain. (Patient not taking: Reported on 06/06/2020)  . rosuvastatin (CRESTOR) 20 MG tablet Take 1 tablet (20 mg total) by mouth daily.   No current facility-administered medications on file prior to visit.     Allergies:   Alendronate sodium, Codeine, Ciprofloxacin, and Tetracycline   Social History   Tobacco Use  . Smoking status: Never Smoker  . Smokeless tobacco: Never Used  Vaping Use  . Vaping Use: Never used  Substance Use Topics  . Alcohol use: Yes    Comment: social  . Drug use: No    Family History: family history includes Diabetes in her mother; Heart disease in her mother; Hypertension in her father and mother; Throat cancer (age of onset: 72) in her father. There is no history of Colon cancer, Esophageal cancer, Rectal cancer, or Stomach cancer. mother had hypertension, HF. Maternal grandfather also had HF. Father had PAD and and hypertension. Sister is 72, just had a stroke from high cholesterol.  ROS:   Please see the history of present illness.  Additional pertinent ROS otherwise unremarkable.  EKGs/Labs/Other Studies Reviewed:    The following studies were reviewed today: CT coronary 03/08/20 1.  Mild nonobstructive CAD, CADRADS = 2.  2. Coronary calcium score of 336. This was 92nd percentile for age and sex matched control.  3. Normal coronary origin with left dominance.  EKG:  EKG is personally reviewed.  The ekg ordered 02/10/20 demonstrates NSR, LBBB  Recent Labs: 03/07/2020: BUN 10; Creatinine, Ser 0.75; Potassium 4.5; Sodium 141  Recent Lipid Panel    Component  Value Date/Time   CHOL 215 06/05/2010 0000   TRIG 95 06/05/2010 0000   HDL 58.3 06/05/2010 0000   LDLCALC 137.7 06/05/2010 0000    Physical Exam:    VS:  BP 122/73   Pulse 80   Temp (!) 97.2 F (36.2 C)   Ht '5\' 3"'  (1.6 m)   Wt 148 lb 12.8 oz (67.5 kg)   SpO2 94%   BMI 26.36 kg/m     Wt Readings from Last 3 Encounters:  09/27/20 148 lb 12.8 oz (67.5 kg)  06/30/20 148 lb (67.1 kg)  06/06/20 148 lb (67.1 kg)    GEN: Well nourished, well developed in no acute distress HEENT: Normal, moist mucous membranes NECK: No JVD CARDIAC: regular rhythm, normal S1 and S2, no  rubs or gallops. No murmur. VASCULAR: Radial and DP pulses 2+ bilaterally. No carotid bruits RESPIRATORY:  Clear to auscultation without rales, wheezing or rhonchi  ABDOMEN: Soft, non-tender, non-distended MUSCULOSKELETAL:  Ambulates independently SKIN: Warm and dry, no edema NEUROLOGIC:  Alert and oriented x 3. No focal neuro deficits noted. PSYCHIATRIC:  Normal affect   ASSESSMENT:    1. Nonocclusive coronary atherosclerosis of native coronary artery   2. Medication management   3. Aortic atherosclerosis ([place])   4. Hypercholesteremia   5. Essential hypertension   6. Cardiac risk counseling   7. Counseling on health promotion and disease prevention    PLAN:    CT coronary with evidence of CAD due to calcified coronary lesion, aortic atherosclerosis:  -counseled on red flag warning signs that need immediate medical attention -continue rosuvastatin 20 mg daily -continue aspirin 81 mg -given nitroglycerin sublingual, instructed on use. Has not required -risk factors: family history of heart disease, hypertension, hyperlipidemia  Hypercholesterolemia: -continue rosuvastatin 20 mg daily -due for recheck of lipids, will also check LFTs  Hypertension: at goal today -continue HCTZ -checking potassium in CMET given cramps  Cardiac risk counseling and prevention recommendations: -recommend heart  healthy/Mediterranean diet, with whole grains, fruits, vegetable, fish, lean meats, nuts, and olive oil. Limit salt. -recommend moderate walking, 3-5 times/week for 30-50 minutes each session. Aim for at least 150 minutes.week. Goal should be pace of 3 miles/hours, or walking 1.5 miles in 30 minutes -recommend avoidance of tobacco products. Avoid excess alcohol.  Plan for follow up: 1 year or sooner as needed  Buford Dresser, MD, PhD [place]  [facility]    Medication Adjustments/Labs and Tests Ordered: Current medicines are reviewed at length with the patient today.  Concerns regarding medicines are outlined above.  Orders Placed This Encounter  Procedures  . Lipid panel  . Hepatic function panel   No orders of the defined types were placed in this encounter.   Patient Instructions  Medication Instructions:  Your Physician recommend you continue on your current medication as directed.    *If you need a refill on your cardiac medications before your next appointment, please call your pharmacy*   Lab Work: Your physician recommends lab work today (Lipid, LFT).  If you have labs (blood work) drawn today and your tests are completely normal, you will receive your results only by: Marland Kitchen MyChart Message (if you have MyChart) OR . A paper copy in the mail If you have any lab test that is abnormal or we need to change your treatment, we will call you to review the results.   Testing/Procedures: None   Follow-Up: At [facility], you and your health needs are our priority.  As part of our continuing mission to provide you with exceptional heart care, we have created designated Provider Care Teams.  These Care Teams include your primary Cardiologist (physician) and Advanced Practice Providers (APPs -  Physician Assistants and Nurse Practitioners) who all work together to provide you with the care you need, when you need it.  We recommend signing up for the patient portal  called "MyChart".  Sign up information is provided on this After Visit Summary.  MyChart is used to connect with patients for Virtual Visits (Telemedicine).  Patients are able to view lab/test results, encounter notes, upcoming appointments, etc.  Non-urgent messages can be sent to your provider as well.   To learn more about what you can do with MyChart, go to [email or web address].    Your next  appointment:   1 year(s)  The format for your next appointment:   In Person  Provider:   Buford Dresser, MD      Signed, Buford Dresser, MD PhD 09/27/2020  [place]

## 2020-10-19 LAB — HEPATIC FUNCTION PANEL
ALT: 19 IU/L (ref 0–32)
AST: 19 IU/L (ref 0–40)
Albumin: 4.5 g/dL (ref 3.8–4.8)
Alkaline Phosphatase: 64 IU/L (ref 44–121)
Bilirubin Total: 0.5 mg/dL (ref 0.0–1.2)
Bilirubin, Direct: 0.18 mg/dL (ref 0.00–0.40)
Total Protein: 6.9 g/dL (ref 6.0–8.5)

## 2020-10-19 LAB — LIPID PANEL
Chol/HDL Ratio: 2 ratio (ref 0.0–4.4)
Cholesterol, Total: 137 mg/dL (ref 100–199)
HDL: 68 mg/dL (ref >39)
LDL Chol Calc (NIH): 52 mg/dL (ref 0–99)
Triglycerides: 91 mg/dL (ref 0–149)
VLDL Cholesterol Cal: 17 mg/dL (ref 5–40)

## 2021-02-07 ENCOUNTER — Other Ambulatory Visit: Payer: Self-pay | Admitting: Cardiology

## 2021-04-13 ENCOUNTER — Other Ambulatory Visit: Payer: Self-pay

## 2021-04-17 ENCOUNTER — Encounter: Payer: Self-pay | Admitting: Nurse Practitioner

## 2021-04-17 ENCOUNTER — Ambulatory Visit: Payer: No Typology Code available for payment source | Admitting: Nurse Practitioner

## 2021-04-17 ENCOUNTER — Other Ambulatory Visit (INDEPENDENT_AMBULATORY_CARE_PROVIDER_SITE_OTHER): Payer: No Typology Code available for payment source

## 2021-04-17 VITALS — BP 110/70 | HR 76 | Ht 63.0 in | Wt 148.8 lb

## 2021-04-17 DIAGNOSIS — R141 Gas pain: Secondary | ICD-10-CM | POA: Diagnosis not present

## 2021-04-17 DIAGNOSIS — K5900 Constipation, unspecified: Secondary | ICD-10-CM

## 2021-04-17 DIAGNOSIS — R140 Abdominal distension (gaseous): Secondary | ICD-10-CM | POA: Diagnosis not present

## 2021-04-17 LAB — COMPREHENSIVE METABOLIC PANEL
ALT: 16 U/L (ref 0–35)
AST: 19 U/L (ref 0–37)
Albumin: 4.3 g/dL (ref 3.5–5.2)
Alkaline Phosphatase: 59 U/L (ref 39–117)
BUN: 12 mg/dL (ref 6–23)
CO2: 32 mEq/L (ref 19–32)
Calcium: 9.5 mg/dL (ref 8.4–10.5)
Chloride: 103 mEq/L (ref 96–112)
Creatinine, Ser: 0.79 mg/dL (ref 0.40–1.20)
GFR: 77.16 mL/min (ref >60.00)
Glucose, Bld: 103 mg/dL — ABNORMAL HIGH (ref 70–99)
Potassium: 4.1 mEq/L (ref 3.5–5.1)
Sodium: 141 mEq/L (ref 135–145)
Total Bilirubin: 0.4 mg/dL (ref 0.2–1.2)
Total Protein: 7 g/dL (ref 6.0–8.3)

## 2021-04-17 LAB — CBC WITH DIFFERENTIAL/PLATELET
Basophils Absolute: 0.1 10*3/uL (ref 0.0–0.1)
Basophils Relative: 1.2 % (ref 0.0–3.0)
Eosinophils Absolute: 0.4 10*3/uL (ref 0.0–0.7)
Eosinophils Relative: 5.3 % — ABNORMAL HIGH (ref 0.0–5.0)
HCT: 39.6 % (ref 36.0–46.0)
Hemoglobin: 13.7 g/dL (ref 12.0–15.0)
Lymphocytes Relative: 39.2 % (ref 12.0–46.0)
Lymphs Abs: 2.7 10*3/uL (ref 0.7–4.0)
MCHC: 34.7 g/dL (ref 30.0–36.0)
MCV: 94 fl (ref 78.0–100.0)
Monocytes Absolute: 0.8 10*3/uL (ref 0.1–1.0)
Monocytes Relative: 12.2 % — ABNORMAL HIGH (ref 3.0–12.0)
Neutro Abs: 2.9 10*3/uL (ref 1.4–7.7)
Neutrophils Relative %: 42.1 % — ABNORMAL LOW (ref 43.0–77.0)
Platelets: 291 10*3/uL (ref 150.0–400.0)
RBC: 4.21 Mil/uL (ref 3.87–5.11)
RDW: 12.8 % (ref 11.5–15.5)
WBC: 6.9 10*3/uL (ref 4.0–10.5)

## 2021-04-17 NOTE — Patient Instructions (Addendum)
If you are age 68 or older, your body mass index should be between 23-30. Your Body mass index is 26.36 kg/m. If this is out of the aforementioned range listed, please consider follow up with your Primary Care Provider.  The [facility] providers would like to encourage you to use [facility] to communicate with providers for non-urgent requests or questions.  Due to long hold times on the telephone, sending your provider a message by [facility] may be faster and more efficient way to get a response. Please allow 48 business hours for a response.  Please remember that this is for non-urgent requests/questions.  LABS:  Lab work has been ordered for you today. Our lab is located in the basement. Press "B" on the elevator. The lab is located at the first door on the left as you exit the elevator.  HEALTHCARE LAWS AND MY CHART RESULTS: Due to recent changes in healthcare laws, you may see the results of your imaging and laboratory studies on MyChart before your provider has had a chance to review them.   We understand that in some cases there may be results that are confusing or concerning to you. Not all laboratory results come back in the same time frame and the provider may be waiting for multiple results in order to interpret others.  Please give [place] 48 hours in order for your provider to thoroughly review all the results before contacting the office for clarification of your results.   RECOMMENDATIONS: Colace 100 MG, 1-2 tablets at night. Ibgard, take 1 capsule 2-3 times a day for abdominal gas/bloating/pain. Florastor Probiotic, take 1 twice a day. Please follow up with Dr. Hilarie Fredrickson in 8 weeks.  You have been given a testing kit to check for small intestine bacterial overgrowth (SIBO) which is completed by a company named [facility].   Make sure to return your test in the mail using the return mailing label given to you along with the kit. Your demographic and insurance information have already  been sent to the company and they should be in contact with you over the next week regarding this test.   [facility] will collect an upfront charge of $99.74 for commercial insurance plans and $209.74 is you are paying cash. Make sure to discuss with [facility] PRIOR to having the test if they have gotten informatoin from your insurance company as to how much your testing will cost out of pocket, if any. Please keep in mind that you will be getting a call from phone number [phone] or a similar number. If you do not hear from them within this time frame, please call our office at [phone].   Low-FODMAP Eating Plan  FODMAP stands for fermentable oligosaccharides, disaccharides, monosaccharides, and polyols. These are sugars that are hard for some people to digest. A low-FODMAP eating plan may help some people who have irritable bowel syndrome (IBS) and certain other bowel (intestinal) diseases to manage their symptoms. This meal plan can be complicated to follow. Work with a diet and nutrition specialist (dietitian) to make a low-FODMAP eating plan that is right for you. A dietitian can helpmake sure that you get enough nutrition from this diet. What are tips for following this plan? Reading food labels Check labels for hidden FODMAPs such as: High-fructose syrup. Honey. Agave. Natural fruit flavors. Onion or garlic powder. Choose low-FODMAP foods that contain 3-4 grams of fiber per serving. Check food labels for serving sizes. Eat only one serving at a time to make sure FODMAP  levels stay low. Shopping Shop with a list of foods that are recommended on this diet and make a meal plan. Meal planning Follow a low-FODMAP eating plan for up to 6 weeks, or as told by your health care provider or dietitian. To follow the eating plan: Eliminate high-FODMAP foods from your diet completely. Choose only low-FODMAP foods to eat. You will do this for 2-6 weeks. Gradually reintroduce  high-FODMAP foods into your diet one at a time. Most people should wait a few days before introducing the next new high-FODMAP food into their meal plan. Your dietitian can recommend how quickly you may reintroduce foods. Keep a daily record of what and how much you eat and drink. Make note of any symptoms that you have after eating. Review your daily record with a dietitian regularly to identify which foods you can eat and which foods you should avoid. General tips Drink enough fluid each day to keep your urine pale yellow. Avoid processed foods. These often have added sugar and may be high in FODMAPs. Avoid most dairy products, whole grains, and sweeteners. Work with a dietitian to make sure you get enough fiber in your diet. Avoid high FODMAP foods at meals to manage symptoms. Recommended foods Fruits Bananas, oranges, tangerines, lemons, limes, blueberries, raspberries, strawberries, grapes, cantaloupe, honeydew melon, kiwi, papaya, passion fruit, and pineapple. Limited amounts of dried cranberries, banana chips, and shreddedcoconut. Vegetables Eggplant, zucchini, cucumber, peppers, green beans, bean sprouts, lettuce, arugula, kale, Swiss chard, spinach, collard greens, bok choy, summer squash, potato, and tomato. Limited amounts of corn, carrot, and sweet potato. Greenparts of scallions. Grains Gluten-free grains, such as rice, oats, buckwheat, quinoa, corn, polenta, andmillet. Gluten-free pasta, bread, or cereal. Rice noodles. Corn tortillas. Meats and other proteins Unseasoned beef, pork, poultry, or fish. Eggs. Berniece Salines. Tofu (firm) and tempeh. Limited amounts of nuts and seeds, such as almonds, walnuts, [place] nuts,pecans, peanuts, nut butters, pumpkin seeds, chia seeds, and sunflower seeds. Dairy Lactose-free milk, yogurt, and kefir. Lactose-free cottage cheese and ice cream. Non-dairy milks, such as almond, coconut, hemp, and rice milk. Non-dairy yogurt. Limited amounts of goat cheese,  brie, mozzarella, parmesan, swiss, andother hard cheeses. Fats and oils Butter-free spreads. Vegetable oils, such as olive, canola, and sunflower oil. Seasoning and other foods Artificial sweeteners with names that do not end in "ol," such as aspartame, saccharine, and stevia. Maple syrup, white table sugar, raw sugar, brown sugar, and molasses. Mayonnaise, soy sauce, and tamari. Fresh basil, coriander,parsley, rosemary, and thyme. Beverages Water and mineral water. Sugar-sweetened soft drinks. Small amounts of orangejuice or cranberry juice. Black and green tea. Most dry wines. Coffee. The items listed above may not be a complete list of foods and beverages you can eat. Contact a dietitian for more information. Foods to avoid Fruits Fresh, dried, and juiced forms of apple, pear, watermelon, peach, plum, cherries, apricots, blackberries, boysenberries, figs, nectarines, and mango.Avocado. Vegetables Chicory root, artichoke, asparagus, cabbage, snow peas, Brussels sprouts, broccoli, sugar snap peas, mushrooms, celery, and cauliflower. Onions, garlic,leeks, and the white part of scallions. Grains Wheat, including kamut, durum, and semolina. Barley and bulgur. Couscous.Wheat-based cereals. Wheat noodles, bread, crackers, and pastries. Meats and other proteins Fried or fatty meat. Sausage. Cashews and pistachios. Soybeans, baked beans, black beans, chickpeas, kidney beans, fava beans, navy beans, lentils,black-eyed peas, and split peas. Dairy Milk, yogurt, ice cream, and soft cheese. Cream and sour cream. Milk-basedsauces. Custard. Buttermilk. Soy milk. Seasoning and other foods Any sugar-free gum or candy. Foods that contain artificial sweeteners  such as sorbitol, mannitol, isomalt, or xylitol. Foods that contain honey, high-fructose corn syrup, or agave. Bouillon, vegetable stock, beef stock, and chicken stock. Garlic and onion powder. Condiments made with onion, such ashummus, chutney, pickles,  relish, salad dressing, and salsa. Tomato paste. Beverages Chicory-based drinks. Coffee substitutes. Chamomile tea. Fennel tea. Sweet or fortified wines such as port or sherry. Diet soft drinks made with isomalt, mannitol, maltitol, sorbitol, or xylitol. Apple, pear, and mango juice. Juiceswith high-fructose corn syrup. The items listed above may not be a complete list of foods and beverages you should avoid. Contact a dietitian for more information. Summary FODMAP stands for fermentable oligosaccharides, disaccharides, monosaccharides, and polyols. These are sugars that are hard for some people to digest. A low-FODMAP eating plan is a short-term diet that helps to ease symptoms of certain bowel diseases. The eating plan usually lasts up to 6 weeks. After that, high-FODMAP foods are reintroduced gradually and one at a time. This can help you find out which foods may be causing symptoms. A low-FODMAP eating plan can be complicated. It is best to work with a dietitian who has experience with this type of plan. This information is not intended to replace advice given to you by your health care provider. Make sure you discuss any questions you have with your healthcare provider. Document Revised: 02/24/2020 Document Reviewed: 02/24/2020 Elsevier Patient Education  [place].

## 2021-04-17 NOTE — Progress Notes (Signed)
04/17/2021 [name] [id number] 02-Mar-1951   Chief Complaint: Abdominal bloat, gas   History of Present Illness: [name] is a 70 year old female with history of hypertension, mild nonobstructive coronary artery disease coronary artery calcification per CT 2015, left BBB, GERD with Barrett's esophagus status post Nissen fundoplication and redo fundoplication and diverticulitis. S/P robotic sigmoid colectomy secondary to recurrent diverticulitis 05/2017. Past total hysterectomy. She is followed by Dr. Hilarie Fredrickson.  She complains of having abdominal with excessive gas.  Eating foods such as bread or chocolate worsen her symptoms.  She is currently taking align probiotic and intermittently switches to a different bacteria probiotic over the past few years. She eats Activa yogurt several days weekly which calms down her gas discomfort.  She passes a bowel movement most days but often strains to pass a bowel movement and does not always feel emptied.  She passes pellet-like stools to normal formed brown stools daily.  She takes milk of magnesia as needed when she feels significantly constipated.  MiraLAX resulted in abdominal cramping.  Senna laxative resulted diarrhea.  No rectal bleeding or black stools.  No recent antibiotic use.  She has chronic upper abdominal pain which she mostly ignores, reports is not severe, she just lives with it.  Her most recent colonoscopy was 03/21/2017 which identified severe diverticulosis in the sigmoid colon and the colon was tortuous.  Her most recent EGD was 06/30/2020 which showed intact Nissen fundoplication wrap and a normal stomach and duodenum.  No heartburn or dysphagia.  Celiac serology in 2012 was negative. She takes aspirin 81 mg daily.  No other NSAID use.  EGD 06/30/2020: - Z-line irregular, 39 cm from the incisors. Biopsied. - A Nissen fundoplication was found. The wrap appears intact and healthy. - Normal stomach. - Normal examined  duodenum.  Colonoscopy 03/21/2017: - Severe diverticulosis in the sigmoid colon. The colon was tortuous and angulated in this segment. - The examination was otherwise normal on direct and retroflexion views. - No evidence of colitis or polyps. - No specimens collected.  Current Outpatient Medications on File Prior to Visit  Medication Sig Dispense Refill   acetaminophen (TYLENOL) 325 MG tablet Take 325 mg by mouth at bedtime.      aspirin 81 MG tablet Take 81 mg by mouth daily.      Calcium Carbonate-Vitamin D (CALCIUM 600 + D PO) Take 1 tablet by mouth daily at 12 noon.      diclofenac sodium (VOLTAREN) 1 % GEL Apply 2 g topically 3 (three) times daily as needed (pain).      famotidine (PEPCID) 20 MG tablet Take by mouth.     hydrochlorothiazide (HYDRODIURIL) 25 MG tablet Take by mouth.     levothyroxine (SYNTHROID) 88 MCG tablet TAKE 1 TABLET BY MOUTH DAILY     Multiple Vitamin (MULTI-VITAMIN) tablet Take by mouth.     Omega-3 Fatty Acids (EQL OMEGA 3 FISH OIL) 1400 MG CAPS Take 1,400 mg by mouth daily.      raloxifene (EVISTA) 60 MG tablet TAKE 1 TABLET BY MOUTH EVERY DAY     rosuvastatin (CRESTOR) 20 MG tablet TAKE 1 TABLET(20 MG) BY MOUTH DAILY 90 tablet 3   traMADol (ULTRAM) 50 MG tablet TAKE 1 TO 2 TABLETS BY MOUTH EVERY NIGHT AT BEDTIME AS NEEDED FOR PAIN     valACYclovir (VALTREX) 1000 MG tablet Take 1,000 mg by mouth daily.      nitroGLYCERIN (NITROSTAT) 0.4 MG SL tablet Place 1 tablet (0.4  mg total) under the tongue every 5 (five) minutes as needed for chest pain. (Patient not taking: Reported on 06/06/2020) 90 tablet 3   No current facility-administered medications on file prior to visit.   Allergies  Allergen Reactions   Alendronate Sodium Other (See Comments)    Irritates GERD   Codeine Nausea And Vomiting   Ciprofloxacin Hives and Rash   Tetracycline Hives and Rash     Current Medications, Allergies, Past Medical History, Past Surgical History, Family History and  Social History were reviewed in [employer] record.   Review of Systems:   Constitutional: Negative for fever, sweats, chills or weight loss.  Respiratory: Negative for shortness of breath.   Cardiovascular: Negative for chest pain, palpitations and leg swelling.  Gastrointestinal: See HPI.  Musculoskeletal: Negative for back pain or muscle aches.  Neurological: Negative for dizziness, headaches or paresthesias.    Physical Exam: BP 110/70   Pulse 76   Ht 5\' 3"  (1.6 m)   Wt 148 lb 12.8 oz (67.5 kg)   BMI 26.36 kg/m  General: Well developed 70 year old female in no acute distress. Head: Normocephalic and atraumatic. Eyes: No scleral icterus. Conjunctiva pink . Ears: Normal auditory acuity. Mouth: Dentition intact. No ulcers or lesions.  Lungs: Clear throughout to auscultation. Heart: Regular rate and rhythm, no murmur. Abdomen: Soft, nondistended.  Mild epigastric tenderness without rebound or guarding.  No masses or hepatomegaly. Normal bowel sounds x 4 quadrants.  Rectal: Deferred. Musculoskeletal: Symmetrical with no gross deformities. Extremities: No edema. Neurological: Alert oriented x 4. No focal deficits.  Psychological: Alert and cooperative. Normal mood and affect  Assessment and Recommendations: 58.  70 year old female with abdominal gas bloat and constipation. -Colace 100 mg 1 to 2 capsules p.o. nightly -Milk of magnesia as needed and as tolerated -Florastor probiotic 1 p.o. twice daily, may continue bacteria probiotic of choice -IBgard 1 capsule 2-3 times daily as needed for abdominal gas bloat discomfort -SIBO breath test -FODMAP handout -CBC, CMP -I discussed scheduling abdominal imaging i.e. CTAP if her abdominal gas bloat discomfort persists or worsens -Patient to call me in 2 weeks with an update and further follow-up will be determined  2.  History of GERD, past [employer] fundoplication with redo surgery -Continue famotidine 20 mg  daily  3.  History of diverticulitis status post sigmoid colectomy 05/2017  4.  Colon cancer screening.  Last colonoscopy 03/2017 without evidence of colon polyps. -Next colonoscopy due 03/2027  Today's encounter was 25 minutes which included precharting, chart review, history/exam, result review, face-to-face time used for formulating a treatment plan/follow-up and documentation.

## 2021-04-18 NOTE — Progress Notes (Signed)
Addendum: Reviewed and agree with assessment and management plan. Lajada Janes M, MD  

## 2021-05-01 ENCOUNTER — Telehealth: Payer: Self-pay | Admitting: Nurse Practitioner

## 2021-05-01 NOTE — Telephone Encounter (Signed)
Inbound call from patient calling to ask when is she to receive SIBO kit. Best contact [phone]

## 2021-06-13 ENCOUNTER — Other Ambulatory Visit: Payer: Self-pay | Admitting: Internal Medicine

## 2021-06-13 DIAGNOSIS — Z1231 Encounter for screening mammogram for malignant neoplasm of breast: Secondary | ICD-10-CM

## 2021-07-10 ENCOUNTER — Other Ambulatory Visit: Payer: Self-pay

## 2021-07-10 ENCOUNTER — Ambulatory Visit
Admission: RE | Admit: 2021-07-10 | Discharge: 2021-07-10 | Disposition: A | Discharge status: Home / Self Care | Payer: No Typology Code available for payment source | Source: Ambulatory Visit | Attending: Internal Medicine | Admitting: Internal Medicine

## 2021-07-10 DIAGNOSIS — Z1231 Encounter for screening mammogram for malignant neoplasm of breast: Secondary | ICD-10-CM

## 2021-08-08 ENCOUNTER — Ambulatory Visit: Payer: BC Managed Care – PPO | Admitting: Dermatology

## 2021-08-13 ENCOUNTER — Ambulatory Visit: Payer: No Typology Code available for payment source | Admitting: Dermatology

## 2021-08-13 ENCOUNTER — Other Ambulatory Visit: Payer: Self-pay

## 2021-08-13 ENCOUNTER — Encounter: Payer: Self-pay | Admitting: Dermatology

## 2021-08-13 DIAGNOSIS — Z85828 Personal history of other malignant neoplasm of skin: Secondary | ICD-10-CM | POA: Diagnosis not present

## 2021-08-13 DIAGNOSIS — Z86018 Personal history of other benign neoplasm: Secondary | ICD-10-CM

## 2021-08-13 DIAGNOSIS — L918 Other hypertrophic disorders of the skin: Secondary | ICD-10-CM | POA: Diagnosis not present

## 2021-08-13 DIAGNOSIS — Z1283 Encounter for screening for malignant neoplasm of skin: Secondary | ICD-10-CM

## 2021-08-13 DIAGNOSIS — Z85820 Personal history of malignant melanoma of skin: Secondary | ICD-10-CM

## 2021-08-26 ENCOUNTER — Encounter: Payer: Self-pay | Admitting: Dermatology

## 2021-08-26 NOTE — Progress Notes (Signed)
   Follow-Up Visit   Subjective  [name] is a 70 y.o. female who presents for the following: Annual Exam (No new concerns history of mm and skin cancer and atypical moles ).  General skin examination, history of melanoma Location:  Duration:  Quality:  Associated Signs/Symptoms: Modifying Factors:  Severity:  Timing: Context:   Objective  Well appearing patient in no apparent distress; mood and affect are within normal limits. Neck Pedunculated flesh-colored 1 mm papules  Right Thigh - Anterior Full body skin check skin tags around the neck, keratoses on the back arms legs, MM scar is clear. Left shin pink scale that's new to the patient ok to watch.    A full examination was performed including scalp, head, eyes, ears, nose, lips, neck, chest, axillae, abdomen, back, buttocks, bilateral upper extremities, bilateral lower extremities, hands, feet, fingers, toes, fingernails, and toenails. All findings within normal limits unless otherwise noted below.  Is beneath undergarments not fully examined   Assessment & Plan    Skin tag Neck  No intervention necessary  Skin exam for malignant neoplasm Right Thigh - Anterior  Annual skin examination.  Encouraged to self examine twice annually.      I, Lavonna Monarch, MD, have reviewed all documentation for this visit.  The documentation on 08/26/21 for the exam, diagnosis, procedures, and orders are all accurate and complete.

## 2021-10-01 ENCOUNTER — Encounter (HOSPITAL_BASED_OUTPATIENT_CLINIC_OR_DEPARTMENT_OTHER): Payer: Self-pay | Admitting: Cardiology

## 2021-10-01 ENCOUNTER — Other Ambulatory Visit: Payer: Self-pay

## 2021-10-01 ENCOUNTER — Ambulatory Visit (HOSPITAL_BASED_OUTPATIENT_CLINIC_OR_DEPARTMENT_OTHER): Payer: No Typology Code available for payment source | Admitting: Cardiology

## 2021-10-01 VITALS — BP 126/82 | HR 73 | Ht 64.0 in | Wt 149.6 lb

## 2021-10-01 DIAGNOSIS — I2510 Atherosclerotic heart disease of native coronary artery without angina pectoris: Secondary | ICD-10-CM | POA: Diagnosis not present

## 2021-10-01 DIAGNOSIS — E7800 Pure hypercholesterolemia, unspecified: Secondary | ICD-10-CM | POA: Diagnosis not present

## 2021-10-01 DIAGNOSIS — I10 Essential (primary) hypertension: Secondary | ICD-10-CM

## 2021-10-01 DIAGNOSIS — I700 Atherosclerosis of aorta: Secondary | ICD-10-CM | POA: Diagnosis not present

## 2021-10-01 NOTE — Patient Instructions (Signed)

## 2021-10-01 NOTE — Progress Notes (Signed)
Cardiology Office Note:    Date:  10/01/2021   ID:  [name], DOB May 28, 1951, MRN [id number]  PCP:  Eber Hong, MD  Cardiologist:  Buford Dresser, MD  Referring MD: Eber Hong, MD   CC: follow up  History of Present Illness:    [name] is a 70 y.o. female with a hx of hypertension, hyperlipidemia, hypothyroidism, GERD s/p Nissen, chronic pain who is seen for follow up. I initially met her 02/10/20 as a new consult at the request of Eber Hong, MD for the evaluation and management of chest pain and CV risk given prior coronary calcium.  Cardiac history: coronary artery calcification on CT 05/2014 (noted as LAD), normal nuclear stress test 2012 (per report). Known history of LBBB. Risk factors: hypertension, hyperlipidemia, secondhand smoke exposure, family history  Today: Doing well overall. Husband recently suffered a stroke. Has occasional sharp pains in her neck, scares her. Happens rarely, lasts a few minutes. No other associated symptoms. Better if she massages her neck. Has never used her nitro. Walking routinely, eats well.   Denies chest pain, shortness of breath at rest or with normal exertion. No PND, orthopnea, LE edema or unexpected weight gain. No syncope or palpitations.   Past Medical History:  Diagnosis Date   Anemia    Arthritis    Atypical mole 06/10/2000   mild-left flank   Atypical mole 09/26/2015   mild-right post back   Barrett's esophagus    Cataract    Diverticulosis of colon (without mention of hemorrhage)    Duodenitis 04/12/2011   GASTRITIS 10/05/2008   Qualifier: Diagnosis of  By: Sheran Spine RN II, Manuela Schwartz     GERD (gastroesophageal reflux disease)    Hiatal hernia    Hyperlipidemia    Hypertension    Hypothyroidism    IBS (irritable bowel syndrome)    Irritable bowel syndrome with diarrhea    Melanoma ([place]) 08/29/012   in situ-right thigh ([facility])   S/P redo revision of Lap Nissen Oct 2015 04/12/2011   Skin cancer    Squamous  cell carcinoma of skin 02/17/2008   KA-right leg ([place] 5FU)   Thyroid disease     Past Surgical History:  Procedure Laterality Date   ABDOMINAL HYSTERECTOMY     APPENDECTOMY     COLONOSCOPY  2009   562.10   EYE SURGERY     eyelid   FLEXOR TENDON REPAIR Right 11/23/2015   Procedure: RECONSTUCTION FLEXOR DIGITORUM PROFUNDUS RIGHT SMALL FINGER  POLLICIS LONGUS GRAFT ;  Surgeon: Daryll Brod, MD;  Location: [facility];  Service: Orthopedics;  Laterality: Right;   LAPAROSCOPIC NISSEN FUNDOPLICATION N/A 50/11/7739   Procedure: REDO NISSEN FUNDOPLICATION WITH UPPER ENDOSCOPY;  Surgeon: Kaylyn Lim, MD;  Location: [facility];  Service: General;  Laterality: N/A;   NISSEN FUNDOPLICATION  2878   PROCTOSCOPY N/A 06/06/2017   Procedure: RIGID PROCTOSCOPY;  Surgeon: Michael Boston, MD;  Location: [facility];  Service: General;  Laterality: N/A;   skin cancer removal from right thigh      TMJ ARTHROPLASTY     TOTAL VAGINAL HYSTERECTOMY     TRIGGER FINGER RELEASE      Current Medications: Current Outpatient Medications on File Prior to Visit  Medication Sig   acetaminophen (TYLENOL) 325 MG tablet Take 325 mg by mouth at bedtime.    aspirin 81 MG tablet Take 81 mg by mouth daily.    Calcium Carbonate-Vitamin D (CALCIUM 600 + D PO) Take 1 tablet by  mouth daily at 12 noon.    diclofenac sodium (VOLTAREN) 1 % GEL Apply 2 g topically 3 (three) times daily as needed (pain).    famotidine (PEPCID) 20 MG tablet Take by mouth.   hydrochlorothiazide (HYDRODIURIL) 25 MG tablet Take by mouth.   levothyroxine (SYNTHROID) 88 MCG tablet TAKE 1 TABLET BY MOUTH DAILY   Multiple Vitamin (MULTI-VITAMIN) tablet Take by mouth.   Omega-3 Fatty Acids (EQL OMEGA 3 FISH OIL) 1400 MG CAPS Take 1,400 mg by mouth daily.    raloxifene (EVISTA) 60 MG tablet    rosuvastatin (CRESTOR) 20 MG tablet TAKE 1 TABLET(20 MG) BY MOUTH DAILY   traMADol (ULTRAM) 50 MG tablet TAKE 1 TO 2 TABLETS BY MOUTH EVERY NIGHT AT BEDTIME AS  NEEDED FOR PAIN   valACYclovir (VALTREX) 1000 MG tablet Take 1,000 mg by mouth daily.    nitroGLYCERIN (NITROSTAT) 0.4 MG SL tablet Place 1 tablet (0.4 mg total) under the tongue every 5 (five) minutes as needed for chest pain. (Patient not taking: Reported on 06/06/2020)   No current facility-administered medications on file prior to visit.     Allergies:   Alendronate sodium, Codeine, Ciprofloxacin, and Tetracycline   Social History   Tobacco Use   Smoking status: Never   Smokeless tobacco: Never  Vaping Use   Vaping Use: Never used  Substance Use Topics   Alcohol use: Yes    Comment: social   Drug use: No    Family History: family history includes Diabetes in her mother; Heart disease in her mother; Hypertension in her father and mother; Throat cancer (age of onset: 24) in her father. There is no history of Colon cancer, Esophageal cancer, Rectal cancer, or Stomach cancer. mother had hypertension, HF. Maternal grandfather also had HF. Father had PAD and and hypertension. Sister is 3, just had a stroke from high cholesterol.  ROS:   Please see the history of present illness.  Additional pertinent ROS otherwise unremarkable.  EKGs/Labs/Other Studies Reviewed:    The following studies were reviewed today: CT coronary 03/08/20 1.  Mild nonobstructive CAD, CADRADS = 2.   2. Coronary calcium score of 336. This was 92nd percentile for age and sex matched control.   3. Normal coronary origin with left dominance.  EKG:  EKG is personally reviewed. 10/01/21 NSR, LBBB 02/10/20 NSR, LBBB  Recent Labs: 04/17/2021: ALT 16; BUN 12; Creatinine, Ser 0.79; Hemoglobin 13.7; Platelets 291.0; Potassium 4.1; Sodium 141  Recent Lipid Panel    Component Value Date/Time   CHOL 137 10/19/2020 0822   TRIG 91 10/19/2020 0822   TRIG 95 06/05/2010 0000   HDL 68 10/19/2020 0822   CHOLHDL 2.0 10/19/2020 0822   LDLCALC 52 10/19/2020 0822    Physical Exam:    VS:  BP 126/82   Pulse 73   Ht  5' 4" (1.626 m)   Wt 149 lb 9.6 oz (67.9 kg)   SpO2 96%   BMI 25.68 kg/m     Wt Readings from Last 3 Encounters:  10/01/21 149 lb 9.6 oz (67.9 kg)  04/17/21 148 lb 12.8 oz (67.5 kg)  09/27/20 148 lb 12.8 oz (67.5 kg)    GEN: Well nourished, well developed in no acute distress HEENT: Normal, moist mucous membranes NECK: No JVD CARDIAC: regular rhythm, normal S1 and S2, no rubs or gallops. No murmur. VASCULAR: Radial and DP pulses 2+ bilaterally. No carotid bruits RESPIRATORY:  Clear to auscultation without rales, wheezing or rhonchi  ABDOMEN: Soft, non-tender, non-distended  MUSCULOSKELETAL:  Ambulates independently SKIN: Warm and dry, no edema NEUROLOGIC:  Alert and oriented x 3. No focal neuro deficits noted. PSYCHIATRIC:  Normal affect    ASSESSMENT:    1. Nonocclusive coronary atherosclerosis of native coronary artery   2. Aortic atherosclerosis ([place])   3. Hypercholesteremia   4. Essential hypertension     PLAN:    CT coronary with evidence of CAD due to calcified coronary lesion, aortic atherosclerosis:  -counseled on red flag warning signs that need immediate medical attention -continue rosuvastatin 20 mg daily -continue aspirin 81 mg -given nitroglycerin sublingual, instructed on use. Has not required -risk factors: family history of heart disease, hypertension, hyperlipidemia  Hypercholesterolemia: -continue rosuvastatin 20 mg daily -per care everywhere, lipids 08/03/21 notes tchol 134, hdl 59, ldl 51, tg 118  Hypertension: at goal today -continue HCTZ  Cardiac risk counseling and prevention recommendations: -recommend heart healthy/Mediterranean diet, with whole grains, fruits, vegetable, fish, lean meats, nuts, and olive oil. Limit salt. -recommend moderate walking, 3-5 times/week for 30-50 minutes each session. Aim for at least 150 minutes.week. Goal should be pace of 3 miles/hours, or walking 1.5 miles in 30 minutes -recommend avoidance of tobacco products.  Avoid excess alcohol.  Plan for follow up: 1 year or sooner as needed  Buford Dresser, MD, PhD [place]  [facility]    Medication Adjustments/Labs and Tests Ordered: Current medicines are reviewed at length with the patient today.  Concerns regarding medicines are outlined above.  Orders Placed This Encounter  Procedures   EKG 12-Lead    No orders of the defined types were placed in this encounter.   Patient Instructions  Medication Instructions:  Your Physician recommend you continue on your current medication as directed.    *If you need a refill on your cardiac medications before your next appointment, please call your pharmacy*   Lab Work: None ordered today   Testing/Procedures: None ordered today   Follow-Up: At [facility], you and your health needs are our priority.  As part of our continuing mission to provide you with exceptional heart care, we have created designated Provider Care Teams.  These Care Teams include your primary Cardiologist (physician) and Advanced Practice Providers (APPs -  Physician Assistants and Nurse Practitioners) who all work together to provide you with the care you need, when you need it.  We recommend signing up for the patient portal called "MyChart".  Sign up information is provided on this After Visit Summary.  MyChart is used to connect with patients for Virtual Visits (Telemedicine).  Patients are able to view lab/test results, encounter notes, upcoming appointments, etc.  Non-urgent messages can be sent to your provider as well.   To learn more about what you can do with MyChart, go to [email or web address].    Your next appointment:   1 year(s)  The format for your next appointment:   In Person  Provider:   Buford Dresser, MD    Signed, Buford Dresser, MD PhD 10/01/2021  [place]

## 2021-10-10 ENCOUNTER — Ambulatory Visit: Payer: No Typology Code available for payment source | Admitting: Dermatology

## 2021-11-24 IMAGING — CT CT HEART MORP W/ CTA COR W/ SCORE W/ CA W/CM &/OR W/O CM
4 of 7 series · 8 of 20 positions shown, 9 images · IV contrast (APPLIED)
Comparison: Chest radiograph 06/07/2017. Most recent chest CT
06/03/2014
COMPARISON: Chest radiograph 06/07/2017. Most recent chest CT
06/03/2014

Addendum:
EXAM:
OVER-READ INTERPRETATION  CT CHEST

The following report is an over-read performed by radiologist Dr.
Karel Worley [REDACTED] on 03/08/2020. This over-read
does not include interpretation of cardiac or coronary anatomy or
pathology. The coronary CTA interpretation by the cardiologist is
attached.
HISTORY: Chest pain, nonspecific
Cardiac/Coronary CT
TECHNIQUE: The patient was scanned on a Siemens Force scanner.
PROTOCOL: A 120 kV prospective scan was triggered in the descending thoracic
aorta at 111 HU's. Axial non-contrast 3 mm slices were carried out
through the heart. The data set was analyzed on a dedicated work
station and scored using the Agatson method. Gantry rotation speed
was 250 msecs and collimation was 0.6 mm. Beta blockade and 0.8 mg
of sl NTG was given. The 3D data set was reconstructed in 5%
intervals of 35-75% of the R-R cycle. Diastolic phases were analyzed
on a dedicated work station using MPR, MIP and VRT modes. The
patient received 100mL OMNIPAQUE IOHEXOL 350 MG/ML SOLN of contrast.

[Series 6: best diast 70 % · axial · 0.38mm/px · z∈[+1104,+1147]mm · 2 of 323 slices shown, 3 images]
[im 108/323  vessel]
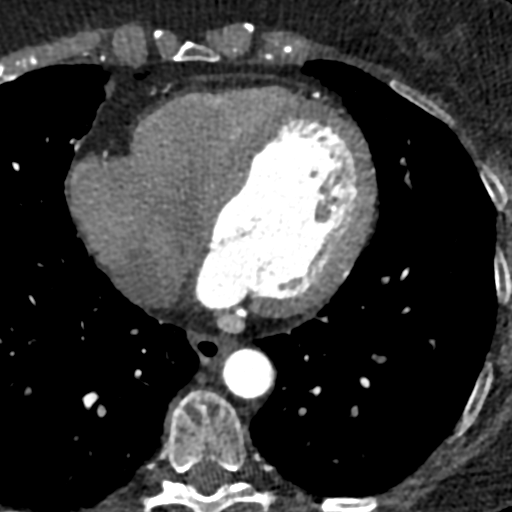
[im 108/323  lung]
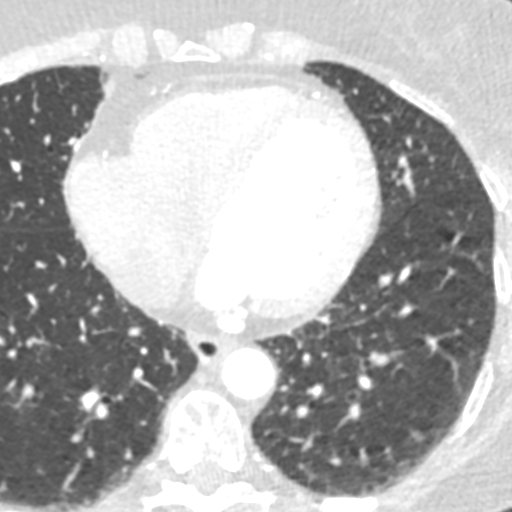
[im 215/323  vessel]
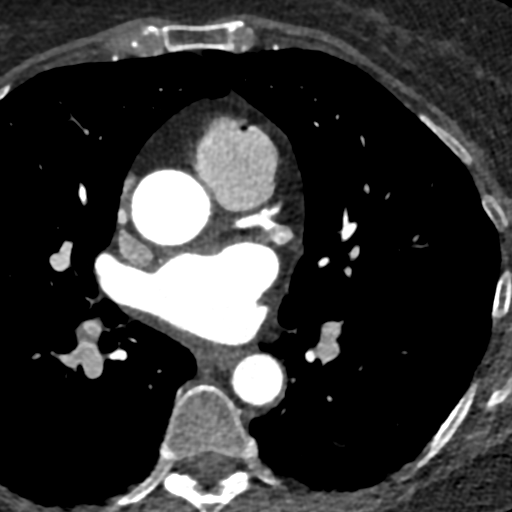

[Series 7: best syst 44 % · axial · 0.38mm/px · z∈[+1104,+1147]mm · 2 of 323 slices shown]
[im 108/323  vessel]
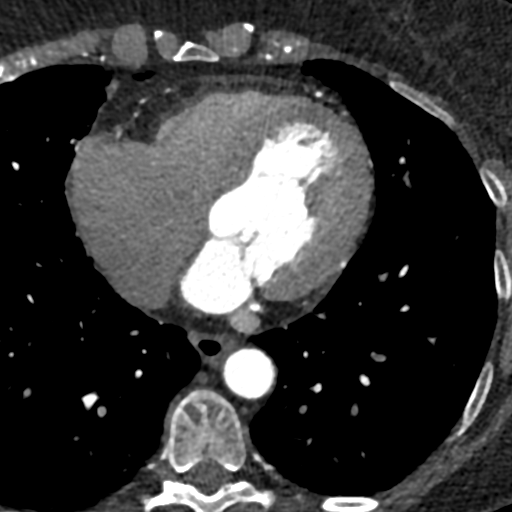
[im 215/323  vessel]
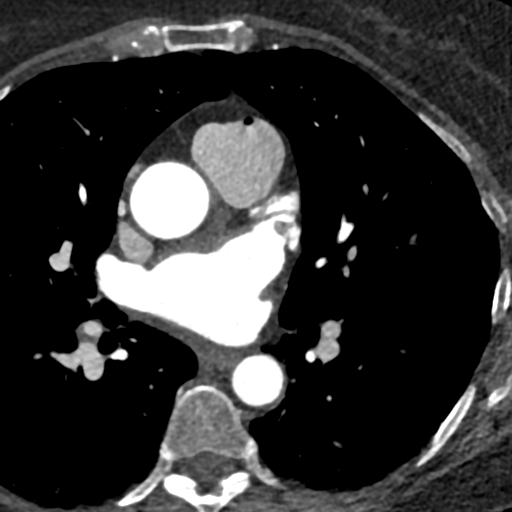

[Series 8: ts diast sharp 70 % · axial · 0.38mm/px · z∈[+1104,+1147]mm · 2 of 323 slices shown]
[im 108/323  lung]
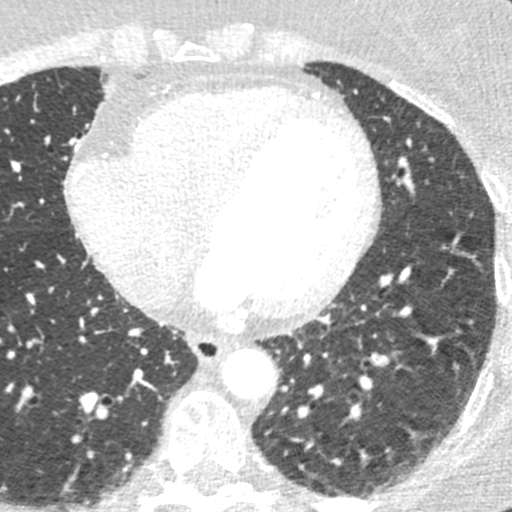
[im 215/323  lung]
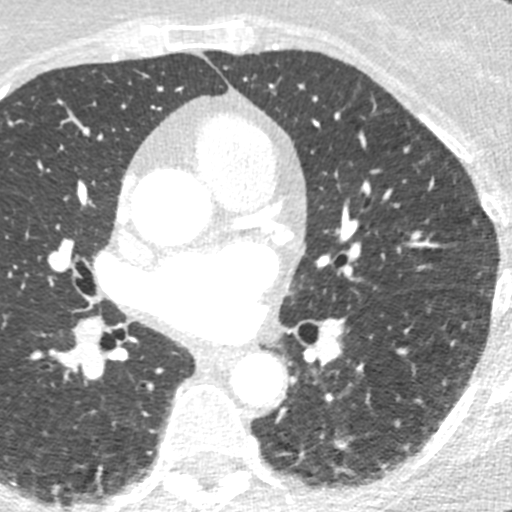

[Series 9: ts syst sharp 44 % · axial · 0.38mm/px · z∈[+1104,+1147]mm · 2 of 323 slices shown]
[im 108/323  lung]
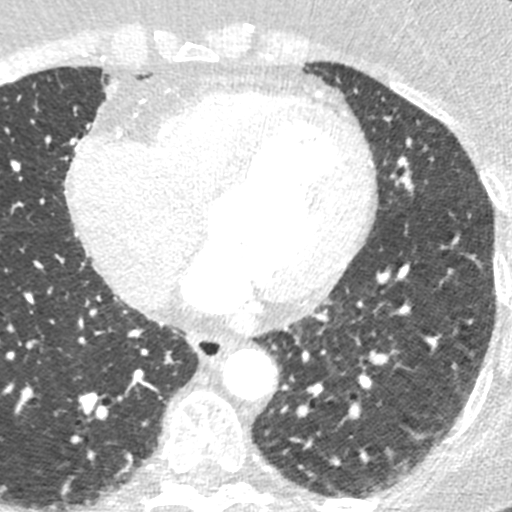
[im 215/323  lung]
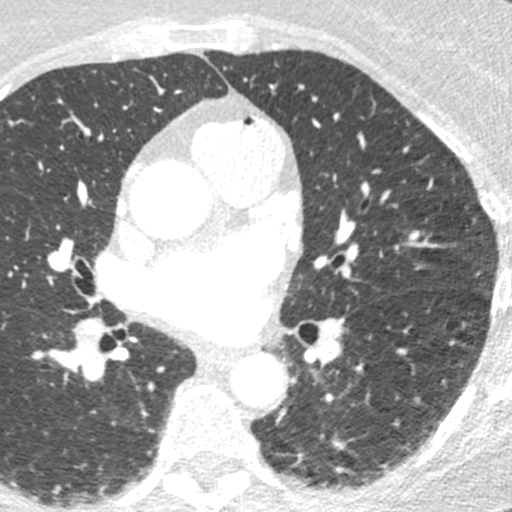

[8 of 20 positions shown; findings below may reference images not displayed]

FINDINGS: Vascular: Normal aortic caliber. Aortic atherosclerosis. No central
pulmonary embolism, on this non-dedicated study.

Mediastinum/Nodes: No imaged thoracic adenopathy.

Lungs/Pleura: No pleural fluid.  Clear imaged lungs.

Upper Abdomen: Normal imaged portions of the liver, spleen.

Musculoskeletal: No acute osseous abnormality.
IMPRESSION: 1.  No acute findings in the imaged extracardiac chest.
2.  Aortic Atherosclerosis (ORP92-66C.C).
FINDINGS: Coronary calcium score: [name] coronary artery calcium score
is 336, which places the patient in the 92 percentile.

Coronary arteries: Normal coronary origins.  Left dominance.

Right Coronary Artery: Small, nondominant vessel. No significant
plaque or stenosis.

Left Main Coronary Artery: Normal caliber vessel. No significant
plaque or stenosis.

Left Anterior Descending Coronary Artery: Normal caliber vessel.
Scattered calcified plaque in the proximal LAD, with maximum
stenosis of 25-49%. Gives rise to large first diagonal and small
second diagonal branches. Distal LAD wraps apex.

Left Circumflex Artery: Large, dominant vessel. Focal small amount
of calcified plaque at ostium with 1-24% stenosis. Gives rise to 2
OM branches.

Aorta: Normal size, 32 mm at the mid ascending aorta (level of the
PA bifurcation) measured double oblique. Scattered calcifications.
No dissection.

Aortic Valve: No calcifications. Trileaflet.

Other findings:

Normal pulmonary vein drainage into the left atrium.

Normal left atrial appendage without a thrombus.

Normal size of the pulmonary artery.
IMPRESSION: 1.  Mild nonobstructive CAD, CADRADS = 2.

2. Coronary calcium score of 336. This was 92nd percentile for age
and sex matched control.

3. Normal coronary origin with left dominance.

*** End of Addendum ***
EXAM:
OVER-READ INTERPRETATION  CT CHEST

The following report is an over-read performed by radiologist Dr.
Karel Worley [REDACTED] on 03/08/2020. This over-read
does not include interpretation of cardiac or coronary anatomy or
pathology. The coronary CTA interpretation by the cardiologist is
attached.
FINDINGS: Vascular: Normal aortic caliber. Aortic atherosclerosis. No central
pulmonary embolism, on this non-dedicated study.

Mediastinum/Nodes: No imaged thoracic adenopathy.

Lungs/Pleura: No pleural fluid.  Clear imaged lungs.

Upper Abdomen: Normal imaged portions of the liver, spleen.

Musculoskeletal: No acute osseous abnormality.
IMPRESSION: 1.  No acute findings in the imaged extracardiac chest.
2.  Aortic Atherosclerosis (ORP92-66C.C).

## 2021-12-15 ENCOUNTER — Other Ambulatory Visit: Payer: Self-pay | Admitting: Cardiology

## 2021-12-17 NOTE — Telephone Encounter (Signed)
Rx(s) sent to pharmacy electronically.  

## 2022-06-12 ENCOUNTER — Other Ambulatory Visit: Payer: Self-pay | Admitting: Cardiology

## 2022-06-12 NOTE — Telephone Encounter (Signed)
Rx request sent to pharmacy.  

## 2022-07-01 ENCOUNTER — Other Ambulatory Visit: Payer: Self-pay | Admitting: Internal Medicine

## 2022-07-01 DIAGNOSIS — Z1231 Encounter for screening mammogram for malignant neoplasm of breast: Secondary | ICD-10-CM

## 2022-07-17 ENCOUNTER — Ambulatory Visit
Admission: RE | Admit: 2022-07-17 | Discharge: 2022-07-17 | Disposition: A | Discharge status: Home / Self Care | Payer: No Typology Code available for payment source | Source: Ambulatory Visit | Attending: Internal Medicine | Admitting: Internal Medicine

## 2022-07-17 DIAGNOSIS — Z1231 Encounter for screening mammogram for malignant neoplasm of breast: Secondary | ICD-10-CM

## 2022-08-13 ENCOUNTER — Ambulatory Visit: Payer: No Typology Code available for payment source | Admitting: Dermatology

## 2022-10-29 ENCOUNTER — Ambulatory Visit (HOSPITAL_BASED_OUTPATIENT_CLINIC_OR_DEPARTMENT_OTHER): Payer: No Typology Code available for payment source | Admitting: Cardiology

## 2022-10-29 ENCOUNTER — Encounter (HOSPITAL_BASED_OUTPATIENT_CLINIC_OR_DEPARTMENT_OTHER): Payer: Self-pay | Admitting: Cardiology

## 2022-10-29 VITALS — BP 128/74 | HR 59 | Ht 64.0 in | Wt 137.7 lb

## 2022-10-29 DIAGNOSIS — I10 Essential (primary) hypertension: Secondary | ICD-10-CM | POA: Diagnosis not present

## 2022-10-29 DIAGNOSIS — E7800 Pure hypercholesterolemia, unspecified: Secondary | ICD-10-CM

## 2022-10-29 DIAGNOSIS — Z7189 Other specified counseling: Secondary | ICD-10-CM

## 2022-10-29 DIAGNOSIS — I2510 Atherosclerotic heart disease of native coronary artery without angina pectoris: Secondary | ICD-10-CM | POA: Diagnosis not present

## 2022-10-29 DIAGNOSIS — I700 Atherosclerosis of aorta: Secondary | ICD-10-CM | POA: Diagnosis not present

## 2022-10-29 NOTE — Progress Notes (Signed)
Cardiology Office Note:    Date:  11/01/2022   ID:  [name], DOB Jun 24, 1951, MRN [id number]  PCP:  Eber Hong, MD  Cardiologist:  Buford Dresser, MD  Referring MD: Eber Hong, MD   CC: follow up  History of Present Illness:    [name] is a 72 y.o. female with a hx of hypertension, hyperlipidemia, hypothyroidism, GERD s/p Nissen, chronic pain who is seen for follow up. I initially met her 02/10/20 as a new consult at the request of Eber Hong, MD for the evaluation and management of chest pain and CV risk given prior coronary calcium.  Cardiac history: coronary artery calcification on CT 05/2014 (noted as LAD), normal nuclear stress test 2012 (per report). Known history of LBBB. Risk factors: hypertension, hyperlipidemia, secondhand smoke exposure, family history  Today, the patient states that she has been feeling ok. Her oldest sister was diagnosed with a-fib and had a small heart attack earlier this year. She has felt palpitations sometimes, just for a few seconds; and isn't worried about them. She has been having a lot of stress as her husband passed away recently to liver cancer.   She denies any chest pain, shortness of breath, or peripheral edema. No lightheadedness, headaches, syncope, orthopnea, or PND.   Past Medical History:  Diagnosis Date   Anemia    Arthritis    Atypical mole 06/10/2000   mild-left flank   Atypical mole 09/26/2015   mild-right post back   Barrett's esophagus    Cataract    Diverticulosis of colon (without mention of hemorrhage)    Duodenitis 04/12/2011   GASTRITIS 10/05/2008   Qualifier: Diagnosis of  By: Sheran Spine RN II, Manuela Schwartz     GERD (gastroesophageal reflux disease)    Hiatal hernia    Hyperlipidemia    Hypertension    Hypothyroidism    IBS (irritable bowel syndrome)    Irritable bowel syndrome with diarrhea    Melanoma ([place]) 08/29/012   in situ-right thigh ([facility])   S/P redo revision of Lap Nissen Oct 2015 04/12/2011    Skin cancer    Squamous cell carcinoma of skin 02/17/2008   KA-right leg ([place] 5FU)   Thyroid disease     Past Surgical History:  Procedure Laterality Date   ABDOMINAL HYSTERECTOMY     APPENDECTOMY     COLONOSCOPY  2009   562.10   EYE SURGERY     eyelid   FLEXOR TENDON REPAIR Right 11/23/2015   Procedure: RECONSTUCTION FLEXOR DIGITORUM PROFUNDUS RIGHT SMALL FINGER  POLLICIS LONGUS GRAFT ;  Surgeon: Daryll Brod, MD;  Location: [facility];  Service: Orthopedics;  Laterality: Right;   LAPAROSCOPIC NISSEN FUNDOPLICATION N/A 40/06/8118   Procedure: REDO NISSEN FUNDOPLICATION WITH UPPER ENDOSCOPY;  Surgeon: Kaylyn Lim, MD;  Location: [facility];  Service: General;  Laterality: N/A;   NISSEN FUNDOPLICATION  1478   PROCTOSCOPY N/A 06/06/2017   Procedure: RIGID PROCTOSCOPY;  Surgeon: Michael Boston, MD;  Location: [facility];  Service: General;  Laterality: N/A;   skin cancer removal from right thigh      TMJ ARTHROPLASTY     TOTAL VAGINAL HYSTERECTOMY     TRIGGER FINGER RELEASE      Current Medications: Current Outpatient Medications on File Prior to Visit  Medication Sig   acetaminophen (TYLENOL) 325 MG tablet Take 325 mg by mouth at bedtime.    aspirin 81 MG tablet Take 81 mg by mouth daily.    Calcium Carbonate-Vitamin D (CALCIUM  600 + D PO) Take 1 tablet by mouth daily at 12 noon.    diclofenac sodium (VOLTAREN) 1 % GEL Apply 2 g topically 3 (three) times daily as needed (pain).    famotidine (PEPCID) 20 MG tablet Take by mouth.   hydrochlorothiazide (HYDRODIURIL) 25 MG tablet Take by mouth.   levothyroxine (SYNTHROID) 88 MCG tablet TAKE 1 TABLET BY MOUTH DAILY   Multiple Vitamin (MULTI-VITAMIN) tablet Take by mouth.   Omega-3 Fatty Acids (EQL OMEGA 3 FISH OIL) 1400 MG CAPS Take 1,400 mg by mouth daily.    raloxifene (EVISTA) 60 MG tablet    rosuvastatin (CRESTOR) 20 MG tablet TAKE 1 TABLET(20 MG) BY MOUTH DAILY   traMADol (ULTRAM) 50 MG tablet TAKE 1 TO 2 TABLETS BY MOUTH EVERY  NIGHT AT BEDTIME AS NEEDED FOR PAIN   valACYclovir (VALTREX) 1000 MG tablet Take 1,000 mg by mouth daily.    nitroGLYCERIN (NITROSTAT) 0.4 MG SL tablet Place 1 tablet (0.4 mg total) under the tongue every 5 (five) minutes as needed for chest pain. (Patient not taking: Reported on 06/06/2020)   No current facility-administered medications on file prior to visit.     Allergies:   Alendronate sodium, Codeine, Ciprofloxacin, and Tetracycline   Social History   Tobacco Use   Smoking status: Never   Smokeless tobacco: Never  Vaping Use   Vaping Use: Never used  Substance Use Topics   Alcohol use: Yes    Comment: social   Drug use: No    Family History: family history includes Diabetes in her mother; Heart disease in her mother; Hypertension in her father and mother; Throat cancer (age of onset: 48) in her father. There is no history of Colon cancer, Esophageal cancer, Rectal cancer, or Stomach cancer. mother had hypertension, HF. Maternal grandfather also had HF. Father had PAD and and hypertension. Sister is 37, just had a stroke from high cholesterol.  ROS:   Please see the history of present illness.  (+) Palpitations Additional pertinent ROS otherwise unremarkable.  EKGs/Labs/Other Studies Reviewed:    The following studies were reviewed today: CT coronary 03/08/20 1.  Mild nonobstructive CAD, CADRADS = 2.   2. Coronary calcium score of 336. This was 92nd percentile for age and sex matched control.   3. Normal coronary origin with left dominance.  EKG:  EKG is personally reviewed. 10/29/2022: sinus bradycardia at 59 bpm, lbbb 10/01/21: NSR, LBBB 02/10/20: NSR, LBBB  Recent Labs: No results found for requested labs within last 365 days.  Recent Lipid Panel    Component Value Date/Time   CHOL 137 10/19/2020 0822   TRIG 91 10/19/2020 0822   TRIG 95 06/05/2010 0000   HDL 68 10/19/2020 0822   CHOLHDL 2.0 10/19/2020 0822   LDLCALC 52 10/19/2020 0822    Physical Exam:     VS:  BP 128/74 (BP Location: Right Arm, Patient Position: Sitting, Cuff Size: Normal)   Pulse (!) 59   Ht '5\' 4"'$  (1.626 m)   Wt 137 lb 11.2 oz (62.5 kg)   BMI 23.64 kg/m     Wt Readings from Last 3 Encounters:  10/29/22 137 lb 11.2 oz (62.5 kg)  10/01/21 149 lb 9.6 oz (67.9 kg)  04/17/21 148 lb 12.8 oz (67.5 kg)    GEN: Well nourished, well developed in no acute distress HEENT: Normal, moist mucous membranes NECK: No JVD CARDIAC: regular rhythm, normal S1 and S2, no rubs or gallops. No murmur. VASCULAR: Radial and DP pulses 2+ bilaterally.  No carotid bruits RESPIRATORY:  Clear to auscultation without rales, wheezing or rhonchi  ABDOMEN: Soft, non-tender, non-distended MUSCULOSKELETAL:  Ambulates independently SKIN: Warm and dry, no edema NEUROLOGIC:  Alert and oriented x 3. No focal neuro deficits noted. PSYCHIATRIC:  Normal affect    ASSESSMENT:    1. Aortic atherosclerosis ([place])   2. Nonocclusive coronary atherosclerosis of native coronary artery   3. Hypercholesteremia   4. Essential hypertension   5. Cardiac risk counseling      PLAN:    CT coronary with evidence of CAD due to calcified coronary lesion, aortic atherosclerosis:  -counseled on red flag warning signs that need immediate medical attention -continue rosuvastatin 20 mg daily -continue aspirin 81 mg -has nitroglycerin sublingual, instructed on use. Has not required -risk factors: family history of heart disease, hypertension, hyperlipidemia  Hypercholesterolemia: -continue rosuvastatin 20 mg daily -per care everywhere, lipids 08/07/22 notes tchol 140, hdl 63, ldl 54, tg 109  Hypertension: at goal today -continue HCTZ  Cardiac risk counseling and prevention recommendations: -recommend heart healthy/Mediterranean diet, with whole grains, fruits, vegetable, fish, lean meats, nuts, and olive oil. Limit salt. -recommend moderate walking, 3-5 times/week for 30-50 minutes each session. Aim for at least  150 minutes.week. Goal should be pace of 3 miles/hours, or walking 1.5 miles in 30 minutes -recommend avoidance of tobacco products. Avoid excess alcohol.  Plan for follow up: 1 year  Buford Dresser, MD, PhD [place]  [facility]    Medication Adjustments/Labs and Tests Ordered: Current medicines are reviewed at length with the patient today.  Concerns regarding medicines are outlined above.  Orders Placed This Encounter  Procedures   EKG 12-Lead    No orders of the defined types were placed in this encounter.  Patient Instructions  Medication Instructions:  Your Physician recommend you continue on your current medication as directed.    *If you need a refill on your cardiac medications before your next appointment, please call your pharmacy*  Follow-Up: At [facility], you and your health needs are our priority.  As part of our continuing mission to provide you with exceptional heart care, we have created designated Provider Care Teams.  These Care Teams include your primary Cardiologist (physician) and Advanced Practice Providers (APPs -  Physician Assistants and Nurse Practitioners) who all work together to provide you with the care you need, when you need it.  We recommend signing up for the patient portal called "MyChart".  Sign up information is provided on this After Visit Summary.  MyChart is used to connect with patients for Virtual Visits (Telemedicine).  Patients are able to view lab/test results, encounter notes, upcoming appointments, etc.  Non-urgent messages can be sent to your provider as well.   To learn more about what you can do with MyChart, go to [email or web address].    Your next appointment:   1 year(s)  The format for your next appointment:   In Person  Provider:   Buford Dresser, MD     I,Coren O'Brien,acting as a scribe for Buford Dresser, MD.,have documented all relevant documentation on the behalf of Buford Dresser, MD,as directed by  Buford Dresser, MD while in the presence of Buford Dresser, MD.  I, Buford Dresser, MD, have reviewed all documentation for this visit. The documentation on 11/01/22 for the exam, diagnosis, procedures, and orders are all accurate and complete.

## 2022-10-29 NOTE — Patient Instructions (Signed)
Medication Instructions:  Your Physician recommend you continue on your current medication as directed.    *If you need a refill on your cardiac medications before your next appointment, please call your pharmacy*  Follow-Up: At [facility], you and your health needs are our priority.  As part of our continuing mission to provide you with exceptional heart care, we have created designated Provider Care Teams.  These Care Teams include your primary Cardiologist (physician) and Advanced Practice Providers (APPs -  Physician Assistants and Nurse Practitioners) who all work together to provide you with the care you need, when you need it.  We recommend signing up for the patient portal called "MyChart".  Sign up information is provided on this After Visit Summary.  MyChart is used to connect with patients for Virtual Visits (Telemedicine).  Patients are able to view lab/test results, encounter notes, upcoming appointments, etc.  Non-urgent messages can be sent to your provider as well.   To learn more about what you can do with MyChart, go to [email or web address].    Your next appointment:   1 year(s)  The format for your next appointment:   In Person  Provider:   Bridgette Christopher, MD  

## 2022-11-01 ENCOUNTER — Encounter (HOSPITAL_BASED_OUTPATIENT_CLINIC_OR_DEPARTMENT_OTHER): Payer: Self-pay | Admitting: Cardiology

## 2022-12-10 ENCOUNTER — Other Ambulatory Visit: Payer: Self-pay | Admitting: Cardiology

## 2022-12-10 NOTE — Telephone Encounter (Signed)
Rx request sent to pharmacy.  

## 2023-03-15 ENCOUNTER — Other Ambulatory Visit: Payer: Self-pay | Admitting: Cardiology

## 2023-06-12 ENCOUNTER — Other Ambulatory Visit: Payer: Self-pay | Admitting: Internal Medicine

## 2023-06-12 DIAGNOSIS — Z1231 Encounter for screening mammogram for malignant neoplasm of breast: Secondary | ICD-10-CM

## 2023-07-04 ENCOUNTER — Other Ambulatory Visit: Payer: Self-pay | Admitting: Cardiology

## 2023-07-21 ENCOUNTER — Ambulatory Visit: Payer: No Typology Code available for payment source

## 2023-08-13 ENCOUNTER — Ambulatory Visit
Admission: RE | Admit: 2023-08-13 | Discharge: 2023-08-13 | Disposition: A | Discharge status: Home / Self Care | Payer: No Typology Code available for payment source | Source: Ambulatory Visit | Attending: Internal Medicine | Admitting: Internal Medicine

## 2023-08-13 DIAGNOSIS — Z1231 Encounter for screening mammogram for malignant neoplasm of breast: Secondary | ICD-10-CM

## 2023-10-05 ENCOUNTER — Other Ambulatory Visit: Payer: Self-pay | Admitting: Cardiology

## 2023-10-27 ENCOUNTER — Ambulatory Visit (HOSPITAL_BASED_OUTPATIENT_CLINIC_OR_DEPARTMENT_OTHER): Payer: No Typology Code available for payment source | Admitting: Cardiology

## 2023-11-03 ENCOUNTER — Ambulatory Visit (HOSPITAL_BASED_OUTPATIENT_CLINIC_OR_DEPARTMENT_OTHER): Payer: No Typology Code available for payment source | Admitting: Family

## 2023-11-03 ENCOUNTER — Encounter (HOSPITAL_BASED_OUTPATIENT_CLINIC_OR_DEPARTMENT_OTHER): Payer: Self-pay | Admitting: Family

## 2023-11-03 VITALS — BP 120/80 | HR 69 | Ht 63.5 in | Wt 133.2 lb

## 2023-11-03 DIAGNOSIS — I10 Essential (primary) hypertension: Secondary | ICD-10-CM | POA: Diagnosis not present

## 2023-11-03 DIAGNOSIS — E785 Hyperlipidemia, unspecified: Secondary | ICD-10-CM

## 2023-11-03 DIAGNOSIS — I2510 Atherosclerotic heart disease of native coronary artery without angina pectoris: Secondary | ICD-10-CM | POA: Diagnosis not present

## 2023-11-03 DIAGNOSIS — K219 Gastro-esophageal reflux disease without esophagitis: Secondary | ICD-10-CM | POA: Diagnosis not present

## 2023-11-03 NOTE — Patient Instructions (Addendum)
 Medication Instructions:  Recommend taking Prilosec (Omeprazole) once per day for 2 weeks then discontinue *If you need a refill on your cardiac medications before your next appointment, please call your pharmacy*   Lab Work: No Labs If you have labs (blood work) drawn today and your tests are completely normal, you will receive your results only by: MyChart Message (if you have MyChart) OR A paper copy in the mail If you have any lab test that is abnormal or we need to change your treatment, we will call you to review the results.   Testing/Procedures: No Testing   Follow-Up: At [facility], you and your health needs are our priority.  As part of our continuing mission to provide you with exceptional heart care, we have created designated Provider Care Teams.  These Care Teams include your primary Cardiologist (physician) and Advanced Practice Providers (APPs -  Physician Assistants and Nurse Practitioners) who all work together to provide you with the care you need, when you need it.  We recommend signing up for the patient portal called MyChart.  Sign up information is provided on this After Visit Summary.  MyChart is used to connect with patients for Virtual Visits (Telemedicine).  Patients are able to view lab/test results, encounter notes, upcoming appointments, etc.  Non-urgent messages can be sent to your provider as well.   To learn more about what you can do with MyChart, go to [email or web address].    Your next appointment:   6 month(s)  Provider:   Shelda Bruckner, MD   Other Instructions Recommend taking Prilosec (Omeprazole) once per day for 2 weeks then discontinue

## 2023-11-03 NOTE — Progress Notes (Signed)
 Cardiology Office Note:  .   Date:  11/03/2023  ID:  [name], DOB 03/24/1951, MRN [id number] PCP: Charlotte Mt, MD  [facility] Providers Cardiologist:  Charlotte Bruckner, MD    History of Present Illness: Charlotte   [name] is a 73 y.o. female with history of hypertension, hyperlipidemia, hypothyroidism, GERD s/p Nissen, chronic pain, LBBB.   Prior CT 05/2014 noted coronary calcification in the LAD.  Normal Myoview 2012.  Cardiac CTA 02/2020 mild nonobstructive CAD with calcium  score 336 (RCA/LM no stenosis, LAD 25-49% stenosis, LCx 1-24% stenosis).  Presents today for follow-up.  Notes a lot of indigestion but also notes that this is previously been reflective of her cardiac symptoms.  She has had hiatal hernia surgery x 2 which makes differentiating coronary versus gastrointestinal discomfort difficult.  Not waking up with chest discomfort and burning twice per week.  She is taking Pepcid  every evening. She did take PPI previously and felt it interfered with her food digesting. Does note every once in awhile she feels her heart feels her heart flutters and her breathing can't catch.  This is infrequent. Drinks lots of water and just one cup of coffee in the morning.  No symptoms with exercise or activity.  ROS: Please see the history of present illness.    All other systems reviewed and are negative.   Studies Reviewed: Charlotte   EKG Interpretation Date/Time:  Monday November 03 2023 09:19:37 EST Ventricular Rate:  60 PR Interval:  132 QRS Duration:  134 QT Interval:  492 QTC Calculation: 492 R Axis:   15  Text Interpretation: Normal sinus rhythm Left bundle branch block  Stable compared to previous. Confirmed by [name] ([id number]) on 11/03/2023 1:17:14 PM    Cardiac Studies & Procedures         CT SCANS  CT CORONARY MORPH W/CTA COR W/SCORE 03/08/2020  Addendum 03/08/2020  5:29 PM ADDENDUM REPORT: 03/08/2020 17:26  HISTORY: Chest pain,  nonspecific  EXAM: Cardiac/Coronary CT  TECHNIQUE: The patient was scanned on a [employer].  PROTOCOL: A 120 kV prospective scan was triggered in the descending thoracic aorta at 111 HU's. Axial non-contrast 3 mm slices were carried out through the heart. The data set was analyzed on a dedicated work station and scored using the Agatson method. Gantry rotation speed was 250 msecs and collimation was 0.6 mm. Beta blockade and 0.8 mg of sl NTG was given. The 3D data set was reconstructed in 5% intervals of 35-75% of the R-R cycle. Diastolic phases were analyzed on a dedicated work station using MPR, MIP and VRT modes. The patient received 100mL OMNIPAQUE  IOHEXOL  350 MG/ML SOLN of contrast.  FINDINGS: Coronary calcium  score: [name] coronary artery calcium  score is 336, which places the patient in the 92 percentile.  Coronary arteries: Normal coronary origins.  Left dominance.  Right Coronary Artery: Small, nondominant vessel. No significant plaque or stenosis.  Left Main Coronary Artery: Normal caliber vessel. No significant plaque or stenosis.  Left Anterior Descending Coronary Artery: Normal caliber vessel. Scattered calcified plaque in the proximal LAD, with maximum stenosis of 25-49%. Gives rise to large first diagonal and small second diagonal branches. Distal LAD wraps apex.  Left Circumflex Artery: Large, dominant vessel. Focal small amount of calcified plaque at ostium with 1-24% stenosis. Gives rise to 2 OM branches.  Aorta: Normal size, 32 mm at the mid ascending aorta (level of the PA bifurcation) measured double oblique. Scattered calcifications. No dissection.  Aortic Valve: No calcifications. Trileaflet.  Other findings:  Normal pulmonary vein drainage into the left atrium.  Normal left atrial appendage without a thrombus.  Normal size of the pulmonary artery.  IMPRESSION: 1.  Mild nonobstructive CAD, CADRADS = 2.  2. Coronary  calcium  score of 336. This was 92nd percentile for age and sex matched control.  3. Normal coronary origin with left dominance.   Electronically Signed By: [name] M.D. On: 03/08/2020 17:26  Narrative EXAM: OVER-READ INTERPRETATION  CT CHEST  The following report is an over-read performed by radiologist Dr. Rockey Park of [facility] on 03/08/2020. This over-read does not include interpretation of cardiac or coronary anatomy or pathology. The coronary CTA interpretation by the cardiologist is attached.  COMPARISON:  Chest radiograph 06/07/2017. Most recent chest CT 06/03/2014  FINDINGS: Vascular: Normal aortic caliber. Aortic atherosclerosis. No central pulmonary embolism, on this non-dedicated study.  Mediastinum/Nodes: No imaged thoracic adenopathy.  Lungs/Pleura: No pleural fluid.  Clear imaged lungs.  Upper Abdomen: Normal imaged portions of the liver, spleen.  Musculoskeletal: No acute osseous abnormality.  IMPRESSION: 1.  No acute findings in the imaged extracardiac chest. 2.  Aortic Atherosclerosis (ICD10-I70.0).  Electronically Signed: By: [name] M.D. On: 03/08/2020 15:09          Risk Assessment/Calculations:             Physical Exam:   VS:  BP 120/80   Pulse 69   Ht 5' 3.5 (1.613 m)   Wt 133 lb 3.2 oz (60.4 kg)   SpO2 99%   BMI 23.23 kg/m    Wt Readings from Last 3 Encounters:  11/03/23 133 lb 3.2 oz (60.4 kg)  10/29/22 137 lb 11.2 oz (62.5 kg)  10/01/21 149 lb 9.6 oz (67.9 kg)    GEN: Well nourished, well developed in no acute distress NECK: No JVD; No carotid bruits CARDIAC: RRR, no murmurs, rubs, gallops RESPIRATORY:  Clear to auscultation without rales, wheezing or rhonchi  ABDOMEN: Soft, non-tender, non-distended EXTREMITIES:  No edema; No deformity   ASSESSMENT AND PLAN: .    CAD / HLD, LDL goal <70 - 07/23/23 LDL 57.  GDMT aspirin , rosuvastatin  20 mg daily. Reports episode of indigestion most  consistent with GERD, management below. If symptoms persist, could consider repeat cardiac CTA. EKG today NSR with stable LBBB and no acute changes.   LBBB - Stable finding by EKG.   HTN - BP well controlled. Continue current antihypertensive regimen.    GERD - Continue Pepcid . Recommend 2 week course OTC Prilosec daily. If still with indigestion difficulties recommend reaching out to her primary care provider.       Dispo: follow up in 6 months with Dr. Bruckner  Signed, Reche GORMAN Finder, NP

## 2024-01-04 ENCOUNTER — Other Ambulatory Visit: Payer: Self-pay | Admitting: Cardiology

## 2024-04-01 ENCOUNTER — Other Ambulatory Visit: Payer: Self-pay | Admitting: Cardiology

## 2024-04-01 ENCOUNTER — Telehealth (HOSPITAL_BASED_OUTPATIENT_CLINIC_OR_DEPARTMENT_OTHER): Payer: Self-pay | Admitting: Cardiology

## 2024-04-01 NOTE — Telephone Encounter (Signed)
*  STAT* If patient is at the pharmacy, call can be transferred to refill team.   1. Which medications need to be refilled? (please list name of each medication and dose if known) need a new prescription for Rosuvastatin    2. Would you like to learn more about the convenience, safety, & potential cost savings by using the [facility]?     3. Are you open to using the [facility] (Type [facility]..   4. Which pharmacy/location (including street and city if local pharmacy) is medication to be sent to? [facility] [employer] [facility], [place]   5. Do they need a 30 day or 90 day supply? 90 days and refills

## 2024-04-01 NOTE — Telephone Encounter (Signed)
 RX sent in to requested Pharmacy

## 2024-07-12 ENCOUNTER — Other Ambulatory Visit: Payer: Self-pay | Admitting: Internal Medicine

## 2024-07-12 DIAGNOSIS — Z1231 Encounter for screening mammogram for malignant neoplasm of breast: Secondary | ICD-10-CM

## 2024-08-16 ENCOUNTER — Ambulatory Visit

## 2024-08-18 ENCOUNTER — Ambulatory Visit

## 2024-09-07 ENCOUNTER — Ambulatory Visit
Admission: RE | Admit: 2024-09-07 | Discharge: 2024-09-07 | Disposition: A | Discharge status: Home / Self Care | Source: Ambulatory Visit | Attending: Internal Medicine | Admitting: Internal Medicine

## 2024-09-07 DIAGNOSIS — Z1231 Encounter for screening mammogram for malignant neoplasm of breast: Secondary | ICD-10-CM
